# Patient Record
Sex: Female | Born: 1940 | Race: Black or African American | Hispanic: No | Marital: Single | Smoking: Never smoker
Health system: Southern US, Community
[De-identification: ages and names within clinical notes are randomized; demographics above are authoritative.]

## PROBLEM LIST (undated history)

## (undated) DIAGNOSIS — IMO0002 Reserved for concepts with insufficient information to code with codable children: Secondary | ICD-10-CM

## (undated) DIAGNOSIS — Z8719 Personal history of other diseases of the digestive system: Secondary | ICD-10-CM

## (undated) DIAGNOSIS — Z8711 Personal history of peptic ulcer disease: Secondary | ICD-10-CM

## (undated) DIAGNOSIS — I739 Peripheral vascular disease, unspecified: Secondary | ICD-10-CM

## (undated) DIAGNOSIS — I10 Essential (primary) hypertension: Secondary | ICD-10-CM

## (undated) HISTORY — DX: Peripheral vascular disease, unspecified: I73.9

## (undated) HISTORY — DX: Reserved for concepts with insufficient information to code with codable children: IMO0002

## (undated) HISTORY — PX: ABDOMINAL HYSTERECTOMY: SHX81

## (undated) HISTORY — PX: TOE AMPUTATION: SHX809

## (undated) HISTORY — DX: Essential (primary) hypertension: I10

## (undated) HISTORY — PX: TONSILLECTOMY: SUR1361

---

## 2013-09-22 ENCOUNTER — Encounter: Payer: Self-pay | Admitting: Family Medicine

## 2013-10-11 ENCOUNTER — Ambulatory Visit (INDEPENDENT_AMBULATORY_CARE_PROVIDER_SITE_OTHER): Payer: Commercial Managed Care - HMO | Admitting: Family Medicine

## 2013-10-11 ENCOUNTER — Encounter: Payer: Self-pay | Admitting: Family Medicine

## 2013-10-11 VITALS — BP 180/120 | HR 78 | Temp 98.1°F | Resp 16 | Ht <= 58 in | Wt 145.0 lb

## 2013-10-11 DIAGNOSIS — I10 Essential (primary) hypertension: Secondary | ICD-10-CM

## 2013-10-11 MED ORDER — LOSARTAN POTASSIUM 50 MG PO TABS: 50.0000 mg | ORAL_TABLET | Freq: Every day | ORAL | Status: DC

## 2013-10-11 NOTE — Progress Notes (Signed)
Subjective:    Patient ID: Ann Powell, female    DOB: 02/07/1941, 73 y.o.   MRN: [id number]  HPI Patient is a very pleasant 73 year old white female who presents today to establish care. She recently had a mammogram in September 2014. Her mammogram was normal in. She has never had a colonoscopy. He has a history of a hysterectomy and therefore does not require Pap smears. She is overdue for Pneumovax 23 along with Prevnar 13. Her blood pressure is extremely high today 180/120. After sitting for half an hour in the exam room talking with me her blood pressure recheck was 152/88. She denies any chest pain shortness of breath or dyspnea on exertion. She denies any significant past medical history. Her only past surgical history is hysterectomy. She is on numerous over-the-counter vitamins and supplements. She takes no prescription medication. Past Medical History  Diagnosis Date  . Ulcer   . Hypertension    Past Surgical History  Procedure Laterality Date  . Abdominal hysterectomy     Current Outpatient Prescriptions on File Prior to Visit  Medication Sig Dispense Refill  . cholecalciferol (VITAMIN D) 1000 UNITS tablet Take 1,000 Units by mouth daily.      Marland Kitchen. co-enzyme Q-10 30 MG capsule Take 30 mg by mouth 3 (three) times daily.      Marland Kitchen. FOLIC ACID PO Take 400 mg by mouth. 2 qam      . Krill Oil 1000 MG CAPS Take 1,000 mg by mouth daily.       . magnesium 30 MG tablet Take 500 mg by mouth daily.        No current facility-administered medications on file prior to visit.   No Known Allergies History   Social History  . Marital Status: Single    Spouse Name: N/A    Number of Children: N/A  . Years of Education: N/A   Occupational History  . Not on file.   Social History Main Topics  . Smoking status: Never Smoker   . Smokeless tobacco: Never Used  . Alcohol Use: No  . Drug Use: No  . Sexual Activity: No   Other Topics Concern  . Not on file   Social History Narrative    . No narrative on file   No family history on file.    Review of Systems  All other systems reviewed and are negative.      Objective:   Physical Exam  Vitals reviewed. Constitutional: She appears well-developed and well-nourished. No distress.  Neck: Neck supple. No JVD present. No thyromegaly present.  Cardiovascular: Normal rate, regular rhythm and normal heart sounds.   No murmur heard. Pulmonary/Chest: Effort normal and breath sounds normal. No respiratory distress. She has no wheezes. She has no rales.  Abdominal: Soft. Bowel sounds are normal. She exhibits no distension. There is no tenderness. There is no rebound and no guarding.  Musculoskeletal: She exhibits no edema.  Lymphadenopathy:    She has no cervical adenopathy.  Skin: She is not diaphoretic.          Assessment & Plan:  1. Essential hypertension Start the patient on losartan 50 mg by mouth daily recheck blood pressure in 2 weeks. Her recheck blood pressure was much better. I believe an element of her hypertension is white coat syndrome.  I have recommended a colonoscopy but she refused. I also recommended Prevnar and Pneumovax but she refused. I will discuss further at her next office visit. - COMPLETE METABOLIC  PANEL WITH GFR - CBC with Differential - losartan (COZAAR) 50 MG tablet; Take 1 tablet (50 mg total) by mouth daily.  Dispense: 30 tablet; Refill: 5

## 2013-10-12 LAB — COMPLETE METABOLIC PANEL WITH GFR
ALK PHOS: 46 U/L (ref 39–117)
ALT: 10 U/L (ref 0–35)
AST: 24 U/L (ref 0–37)
Albumin: 4.4 g/dL (ref 3.5–5.2)
BILIRUBIN TOTAL: 1.2 mg/dL (ref 0.2–1.2)
BUN: 12 mg/dL (ref 6–23)
CO2: 29 mEq/L (ref 19–32)
Calcium: 10.2 mg/dL (ref 8.4–10.5)
Chloride: 101 mEq/L (ref 96–112)
Creat: 0.84 mg/dL (ref 0.50–1.10)
GFR, EST NON AFRICAN AMERICAN: 70 mL/min
GFR, Est African American: 80 mL/min
GLUCOSE: 91 mg/dL (ref 70–99)
POTASSIUM: 5.5 meq/L — AB (ref 3.5–5.3)
Sodium: 140 mEq/L (ref 135–145)
TOTAL PROTEIN: 7 g/dL (ref 6.0–8.3)

## 2013-10-12 LAB — CBC WITH DIFFERENTIAL/PLATELET
Basophils Absolute: 0 10*3/uL (ref 0.0–0.1)
Basophils Relative: 1 % (ref 0–1)
EOS PCT: 1 % (ref 0–5)
Eosinophils Absolute: 0 10*3/uL (ref 0.0–0.7)
HEMATOCRIT: 34.7 % — AB (ref 36.0–46.0)
HEMOGLOBIN: 11.2 g/dL — AB (ref 12.0–15.0)
LYMPHS ABS: 1.9 10*3/uL (ref 0.7–4.0)
LYMPHS PCT: 39 % (ref 12–46)
MCH: 28.4 pg (ref 26.0–34.0)
MCHC: 32.3 g/dL (ref 30.0–36.0)
MCV: 87.8 fL (ref 78.0–100.0)
MONO ABS: 0.3 10*3/uL (ref 0.1–1.0)
MONOS PCT: 6 % (ref 3–12)
Neutro Abs: 2.6 10*3/uL (ref 1.7–7.7)
Neutrophils Relative %: 53 % (ref 43–77)
Platelets: 579 10*3/uL — ABNORMAL HIGH (ref 150–400)
RBC: 3.95 MIL/uL (ref 3.87–5.11)
RDW: 17.8 % — ABNORMAL HIGH (ref 11.5–15.5)
WBC: 4.9 10*3/uL (ref 4.0–10.5)

## 2013-10-25 ENCOUNTER — Encounter: Payer: Self-pay | Admitting: Family Medicine

## 2013-10-25 ENCOUNTER — Ambulatory Visit (INDEPENDENT_AMBULATORY_CARE_PROVIDER_SITE_OTHER): Payer: Commercial Managed Care - HMO | Admitting: Family Medicine

## 2013-10-25 VITALS — BP 136/74 | HR 76 | Temp 98.2°F | Resp 14 | Ht <= 58 in | Wt 141.0 lb

## 2013-10-25 DIAGNOSIS — I10 Essential (primary) hypertension: Secondary | ICD-10-CM

## 2013-10-25 DIAGNOSIS — Z23 Encounter for immunization: Secondary | ICD-10-CM

## 2013-10-25 DIAGNOSIS — D649 Anemia, unspecified: Secondary | ICD-10-CM

## 2013-10-25 MED ORDER — LOSARTAN POTASSIUM 50 MG PO TABS: 50.0000 mg | ORAL_TABLET | Freq: Every day | ORAL | Status: DC

## 2013-10-25 NOTE — Progress Notes (Signed)
Subjective:    Patient ID: Ann Powell, female    DOB: 1940/06/13, 73 y.o.   MRN: [id number]  HPI 10/10/13 Patient is a very pleasant 73 year old white female who presents today to establish care. She recently had a mammogram in September 2014. Her mammogram was normal in. She has never had a colonoscopy. He has a history of a hysterectomy and therefore does not require Pap smears. She is overdue for Pneumovax 23 along with Prevnar 13. Her blood pressure is extremely high today 180/120. After sitting for half an hour in the exam room talking with me her blood pressure recheck was 152/88. She denies any chest pain shortness of breath or dyspnea on exertion. She denies any significant past medical history. Her only past surgical history is hysterectomy. She is on numerous over-the-counter vitamins and supplements. She takes no prescription medication.  At that time, my plan was: 1. Essential hypertension Start the patient on losartan 50 mg by mouth daily recheck blood pressure in 2 weeks. Her recheck blood pressure was much better. I believe an element of her hypertension is white coat syndrome.  I have recommended a colonoscopy but she refused. I also recommended Prevnar and Pneumovax but she refused. I will discuss further at her next office visit. - COMPLETE METABOLIC PANEL WITH GFR - CBC with Differential - losartan (COZAAR) 50 MG tablet; Take 1 tablet (50 mg total) by mouth daily.  Dispense: 30 tablet; Refill: 5  10/25/13 Patient is here today for followup. Her blood pressures have been much better. Her blood pressures ranging 120-140/60-70 at home. She denies any side effects on the medication. Her labwork did show mildly elevated potassium along with a mildly suppressed hemoglobin. She is here today for followup. She is also due for Pneumovax. Past Medical History  Diagnosis Date  . Ulcer   . Hypertension    Past Surgical History  Procedure Laterality Date  . Abdominal hysterectomy       Current Outpatient Prescriptions on File Prior to Visit  Medication Sig Dispense Refill  . ALOE VERA EX Take 5,000 mg by mouth daily.      . Calcium-Magnesium-Vitamin D (CALCIUM 1200+D3 PO) Take 1,200 mg by mouth daily.      . cholecalciferol (VITAMIN D) 1000 UNITS tablet Take 1,000 Units by mouth daily.      Marland Kitchen. co-enzyme Q-10 30 MG capsule Take 30 mg by mouth 3 (three) times daily.      Marland Kitchen. FOLIC ACID PO Take 400 mg by mouth. 2 qam      . Krill Oil 1000 MG CAPS Take 1,000 mg by mouth daily.       . magnesium 30 MG tablet Take 500 mg by mouth daily.       . vitamin B-12 (CYANOCOBALAMIN) 500 MCG tablet Take 500 mcg by mouth daily.      . vitamin C (ASCORBIC ACID) 500 MG tablet Take 500 mg by mouth daily.       No current facility-administered medications on file prior to visit.   No Known Allergies History   Social History  . Marital Status: Single    Spouse Name: N/A    Number of Children: N/A  . Years of Education: N/A   Occupational History  . Not on file.   Social History Main Topics  . Smoking status: Never Smoker   . Smokeless tobacco: Never Used  . Alcohol Use: No  . Drug Use: No  . Sexual Activity: No   Other  Topics Concern  . Not on file   Social History Narrative  . No narrative on file   No family history on file.    Review of Systems  All other systems reviewed and are negative.      Objective:   Physical Exam  Vitals reviewed. Constitutional: She appears well-developed and well-nourished. No distress.  Neck: Neck supple. No JVD present. No thyromegaly present.  Cardiovascular: Normal rate, regular rhythm and normal heart sounds.   No murmur heard. Pulmonary/Chest: Effort normal and breath sounds normal. No respiratory distress. She has no wheezes. She has no rales.  Abdominal: Soft. Bowel sounds are normal. She exhibits no distension. There is no tenderness. There is no rebound and no guarding.  Musculoskeletal: She exhibits no edema.   Lymphadenopathy:    She has no cervical adenopathy.  Skin: She is not diaphoretic.          Assessment & Plan:   1. Essential hypertension Blood pressure is well controlled. I will continue losartan 50 mg by mouth daily. Recheck BMP to monitor her potassium. - losartan (COZAAR) 50 MG tablet; Take 1 tablet (50 mg total) by mouth daily.  Dispense: 30 tablet; Refill: 11 - Basic Metabolic Panel  2. Anemia, unspecified Check the patient's iron level, B12 level, and stool cards x3. Stool cards are positive for blood, I recommended GI consult. - Vitamin B12 - Iron  3. Need for prophylactic vaccination against Streptococcus pneumoniae (pneumococcus) - Pneumococcal polysaccharide vaccine 23-valent greater than or equal to 2yo subcutaneous/IM

## 2013-10-26 LAB — BASIC METABOLIC PANEL
BUN: 14 mg/dL (ref 6–23)
CO2: 31 mEq/L (ref 19–32)
Calcium: 10.4 mg/dL (ref 8.4–10.5)
Chloride: 101 mEq/L (ref 96–112)
Creat: 0.88 mg/dL (ref 0.50–1.10)
Glucose, Bld: 95 mg/dL (ref 70–99)
Potassium: 4.9 mEq/L (ref 3.5–5.3)
Sodium: 142 mEq/L (ref 135–145)

## 2013-10-26 LAB — IRON: Iron: 42 ug/dL (ref 42–145)

## 2013-10-26 LAB — VITAMIN B12: Vitamin B-12: >2000 pg/mL — ABNORMAL HIGH (ref 211–911)

## 2013-10-28 ENCOUNTER — Other Ambulatory Visit: Payer: Commercial Managed Care - HMO

## 2013-10-28 ENCOUNTER — Other Ambulatory Visit: Payer: Self-pay | Admitting: Family Medicine

## 2013-10-28 DIAGNOSIS — Z1211 Encounter for screening for malignant neoplasm of colon: Secondary | ICD-10-CM

## 2013-10-29 LAB — FECAL OCCULT BLOOD, IMMUNOCHEMICAL
FECAL OCCULT BLOOD: NEGATIVE
FECAL OCCULT BLOOD: NEGATIVE
Fecal Occult Blood: NEGATIVE

## 2013-11-11 ENCOUNTER — Encounter: Payer: Self-pay | Admitting: *Deleted

## 2013-12-28 ENCOUNTER — Ambulatory Visit (INDEPENDENT_AMBULATORY_CARE_PROVIDER_SITE_OTHER): Payer: Commercial Managed Care - HMO | Admitting: Family Medicine

## 2013-12-28 DIAGNOSIS — Z23 Encounter for immunization: Secondary | ICD-10-CM

## 2013-12-30 ENCOUNTER — Telehealth: Payer: Self-pay | Admitting: Family Medicine

## 2013-12-30 DIAGNOSIS — Z1211 Encounter for screening for malignant neoplasm of colon: Secondary | ICD-10-CM

## 2013-12-30 NOTE — Telephone Encounter (Signed)
Colonoscopy in referral workq

## 2013-12-30 NOTE — Telephone Encounter (Signed)
Patient is calling to see if we can refer her for a colonscopy  [phone]

## 2014-01-19 ENCOUNTER — Encounter: Payer: Self-pay | Admitting: Family Medicine

## 2014-10-17 ENCOUNTER — Encounter: Payer: Self-pay | Admitting: Family Medicine

## 2014-10-17 ENCOUNTER — Ambulatory Visit (INDEPENDENT_AMBULATORY_CARE_PROVIDER_SITE_OTHER): Payer: Commercial Managed Care - HMO | Admitting: Family Medicine

## 2014-10-17 VITALS — BP 150/96 | HR 76 | Temp 98.0°F | Resp 16 | Ht <= 58 in | Wt 138.5 lb

## 2014-10-17 DIAGNOSIS — Z0000 Encounter for general adult medical examination without abnormal findings: Secondary | ICD-10-CM

## 2014-10-17 DIAGNOSIS — I10 Essential (primary) hypertension: Secondary | ICD-10-CM

## 2014-10-17 DIAGNOSIS — Z23 Encounter for immunization: Secondary | ICD-10-CM

## 2014-10-17 MED ORDER — LOSARTAN POTASSIUM 50 MG PO TABS
50.0000 mg | ORAL_TABLET | Freq: Every day | ORAL | Status: DC
Start: 2014-10-17 — End: 2015-10-09

## 2014-10-17 NOTE — Addendum Note (Signed)
Addended by: Legrand RamsWILLIS, SANDY B on: 10/17/2014 03:41 PM   Modules accepted: Orders

## 2014-10-17 NOTE — Progress Notes (Signed)
Subjective:    Patient ID: Ann Powell, female    DOB: 1940/07/03, 74 y.o.   MRN: [id number]  HPI Patient is here today for complete physical exam. She is a 74 year old African-American female. She has no specific complaints. She again refuses a colonoscopy. She is due for a mammogram in September. She is overdue for a bone density test. She had Pneumovax 23 last year. She is due today for Prevnar 13. She is also due for the shingles vaccine. Due to her age she does not require a Pap smear. Patient is not fasting today and therefore cannot obtain blood work this afternoon. Her blood pressure today in office is elevated. However the patient has been checking her blood pressure frequently at home and her blood pressures typically range between 110 and 120 systolic over 60-70 diastolic. Past Medical History  Diagnosis Date  . Ulcer   . Hypertension    Past Surgical History  Procedure Laterality Date  . Abdominal hysterectomy     Current Outpatient Prescriptions on File Prior to Visit  Medication Sig Dispense Refill  . co-enzyme Q-10 30 MG capsule Take 30 mg by mouth 3 (three) times daily.    Marland Kitchen. FOLIC ACID PO Take 400 mg by mouth. 2 qam    . Krill Oil 1000 MG CAPS Take 1,000 mg by mouth daily.     . magnesium 30 MG tablet Take 500 mg by mouth daily.     . vitamin B-12 (CYANOCOBALAMIN) 500 MCG tablet Take 500 mcg by mouth daily.     No current facility-administered medications on file prior to visit.   No Known Allergies History   Social History  . Marital Status: Single    Spouse Name: N/A  . Number of Children: N/A  . Years of Education: N/A   Occupational History  . Not on file.   Social History Main Topics  . Smoking status: Never Smoker   . Smokeless tobacco: Never Used  . Alcohol Use: No  . Drug Use: No  . Sexual Activity: No   Other Topics Concern  . Not on file   Social History Narrative   No family history on file.    Review of Systems  All other  systems reviewed and are negative.      Objective:   Physical Exam  Constitutional: She is oriented to person, place, and time. She appears well-developed and well-nourished. No distress.  HENT:  Head: Normocephalic and atraumatic.  Right Ear: External ear normal.  Left Ear: External ear normal.  Nose: Nose normal.  Mouth/Throat: Oropharynx is clear and moist. No oropharyngeal exudate.  Eyes: Conjunctivae and EOM are normal. Pupils are equal, round, and reactive to light. Right eye exhibits no discharge. Left eye exhibits no discharge. No scleral icterus.  Neck: Normal range of motion. Neck supple. No JVD present. No tracheal deviation present. No thyromegaly present.  Cardiovascular: Normal rate, regular rhythm, normal heart sounds and intact distal pulses.  Exam reveals no gallop and no friction rub.   No murmur heard. Pulmonary/Chest: Effort normal and breath sounds normal. No stridor. No respiratory distress. She has no wheezes. She has no rales. She exhibits no tenderness.  Abdominal: Soft. Bowel sounds are normal. She exhibits no distension and no mass. There is no tenderness. There is no rebound and no guarding.  Musculoskeletal: Normal range of motion. She exhibits no edema or tenderness.  Lymphadenopathy:    She has no cervical adenopathy.  Neurological: She is alert and  oriented to person, place, and time. She has normal reflexes. She displays normal reflexes. No cranial nerve deficit. She exhibits normal muscle tone. Coordination normal.  Skin: Skin is warm. No rash noted. She is not diaphoretic. No erythema. No pallor.  Psychiatric: She has a normal mood and affect. Her behavior is normal. Judgment and thought content normal.  Vitals reviewed.         Assessment & Plan:  Routine general medical examination at a health care facility - Plan: CBC with Differential/Platelet, COMPLETE METABOLIC PANEL WITH GFR, Lipid panel, TSH, Fecal occult blood, imunochemical, Fecal occult  blood, imunochemical, Fecal occult blood, imunochemical  Benign essential HTN - Plan: Lipid panel  Essential hypertension - Plan: losartan (COZAAR) 50 MG tablet  Patient's physical exam today is normal except for significant kyphosis of the cervical spine. Given her age I will schedule the patient for mammogram. I will also schedule her for a bone density. She does not require a Pap smear. I recommended a colonoscopy but the patient declined. She will consent to fecal occult blood cards 3. I would like the patient to return fasting for a CBC, CMP, fasting lipid panel, and a TSH. Patient's blood pressure at home is adequately controlled and therefore I will not change her medication any further.

## 2014-10-19 ENCOUNTER — Ambulatory Visit (INDEPENDENT_AMBULATORY_CARE_PROVIDER_SITE_OTHER): Payer: Commercial Managed Care - HMO | Admitting: *Deleted

## 2014-10-19 ENCOUNTER — Other Ambulatory Visit: Payer: Commercial Managed Care - HMO

## 2014-10-19 DIAGNOSIS — I10 Essential (primary) hypertension: Secondary | ICD-10-CM | POA: Diagnosis not present

## 2014-10-19 DIAGNOSIS — Z23 Encounter for immunization: Secondary | ICD-10-CM | POA: Diagnosis not present

## 2014-10-19 DIAGNOSIS — Z0000 Encounter for general adult medical examination without abnormal findings: Secondary | ICD-10-CM | POA: Diagnosis not present

## 2014-10-19 LAB — LIPID PANEL
Cholesterol: 183 mg/dL (ref 0–200)
HDL: 60 mg/dL (ref >=46)
LDL CALC: 107 mg/dL — AB (ref 0–99)
Total CHOL/HDL Ratio: 3.1 Ratio
Triglycerides: 80 mg/dL (ref <150)
VLDL: 16 mg/dL (ref 0–40)

## 2014-10-19 LAB — COMPLETE METABOLIC PANEL WITH GFR
ALK PHOS: 47 U/L (ref 39–117)
ALT: 10 U/L (ref 0–35)
AST: 21 U/L (ref 0–37)
Albumin: 4 g/dL (ref 3.5–5.2)
BILIRUBIN TOTAL: 1.1 mg/dL (ref 0.2–1.2)
BUN: 10 mg/dL (ref 6–23)
CHLORIDE: 106 meq/L (ref 96–112)
CO2: 27 mEq/L (ref 19–32)
Calcium: 9.4 mg/dL (ref 8.4–10.5)
Creat: 0.83 mg/dL (ref 0.50–1.10)
GFR, Est African American: 81 mL/min
GFR, Est Non African American: 70 mL/min
GLUCOSE: 69 mg/dL — AB (ref 70–99)
POTASSIUM: 4.3 meq/L (ref 3.5–5.3)
Sodium: 144 mEq/L (ref 135–145)
Total Protein: 6.8 g/dL (ref 6.0–8.3)

## 2014-10-19 LAB — TSH: TSH: 2.117 u[IU]/mL (ref 0.350–4.500)

## 2014-10-19 NOTE — Progress Notes (Signed)
Patient ID: Ann MatesDorothy Powell Sattler, female   DOB: [birth year], 74 y.o.   MRN: [id number]  Patient seen in office for Varicella (Zoster) Vaccination.   Tolerated IM administration well.   Immunization history updated.

## 2014-10-20 ENCOUNTER — Encounter: Payer: Self-pay | Admitting: Family Medicine

## 2014-10-20 LAB — CBC WITH DIFFERENTIAL/PLATELET
BASOS ABS: 0 10*3/uL (ref 0.0–0.1)
Basophils Relative: 1 % (ref 0–1)
EOS PCT: 1 % (ref 0–5)
Eosinophils Absolute: 0 10*3/uL (ref 0.0–0.7)
HEMATOCRIT: 34.5 % — AB (ref 36.0–46.0)
Hemoglobin: 11 g/dL — ABNORMAL LOW (ref 12.0–15.0)
Lymphocytes Relative: 39 % (ref 12–46)
Lymphs Abs: 1.6 10*3/uL (ref 0.7–4.0)
MCH: 29.1 pg (ref 26.0–34.0)
MCHC: 31.9 g/dL (ref 30.0–36.0)
MCV: 91.3 fL (ref 78.0–100.0)
MONOS PCT: 5 % (ref 3–12)
MPV: 8.7 fL (ref 8.6–12.4)
Monocytes Absolute: 0.2 10*3/uL (ref 0.1–1.0)
NEUTROS ABS: 2.3 10*3/uL (ref 1.7–7.7)
Neutrophils Relative %: 54 % (ref 43–77)
Platelets: 529 10*3/uL — ABNORMAL HIGH (ref 150–400)
RBC: 3.78 MIL/uL — AB (ref 3.87–5.11)
RDW: 17.7 % — ABNORMAL HIGH (ref 11.5–15.5)
WBC: 4.2 10*3/uL (ref 4.0–10.5)

## 2014-10-20 LAB — FECAL OCCULT BLOOD, IMMUNOCHEMICAL
Fecal Occult Blood: NEGATIVE
Fecal Occult Blood: NEGATIVE
Fecal Occult Blood: NEGATIVE

## 2014-12-01 DIAGNOSIS — H524 Presbyopia: Secondary | ICD-10-CM | POA: Diagnosis not present

## 2014-12-01 DIAGNOSIS — H5210 Myopia, unspecified eye: Secondary | ICD-10-CM | POA: Diagnosis not present

## 2014-12-23 DIAGNOSIS — Z1231 Encounter for screening mammogram for malignant neoplasm of breast: Secondary | ICD-10-CM | POA: Diagnosis not present

## 2014-12-23 LAB — HM MAMMOGRAPHY: HM MAMMO: NEGATIVE

## 2014-12-27 ENCOUNTER — Encounter: Payer: Self-pay | Admitting: Family Medicine

## 2015-02-13 ENCOUNTER — Ambulatory Visit (INDEPENDENT_AMBULATORY_CARE_PROVIDER_SITE_OTHER): Payer: Commercial Managed Care - HMO | Admitting: Family Medicine

## 2015-02-13 DIAGNOSIS — Z23 Encounter for immunization: Secondary | ICD-10-CM

## 2015-10-09 ENCOUNTER — Encounter: Payer: Self-pay | Admitting: Family Medicine

## 2015-10-09 ENCOUNTER — Other Ambulatory Visit: Payer: Self-pay | Admitting: Family Medicine

## 2015-10-09 ENCOUNTER — Ambulatory Visit (INDEPENDENT_AMBULATORY_CARE_PROVIDER_SITE_OTHER): Payer: Commercial Managed Care - HMO | Admitting: Family Medicine

## 2015-10-09 VITALS — BP 148/88 | HR 94 | Temp 98.3°F | Resp 16 | Ht <= 58 in | Wt 141.0 lb

## 2015-10-09 DIAGNOSIS — I10 Essential (primary) hypertension: Secondary | ICD-10-CM | POA: Diagnosis not present

## 2015-10-09 DIAGNOSIS — E538 Deficiency of other specified B group vitamins: Secondary | ICD-10-CM | POA: Diagnosis not present

## 2015-10-09 DIAGNOSIS — Z1211 Encounter for screening for malignant neoplasm of colon: Secondary | ICD-10-CM

## 2015-10-09 DIAGNOSIS — D649 Anemia, unspecified: Secondary | ICD-10-CM | POA: Diagnosis not present

## 2015-10-09 MED ORDER — LOSARTAN POTASSIUM 50 MG PO TABS: 50.0000 mg | ORAL_TABLET | Freq: Every day | ORAL | Status: DC

## 2015-10-09 NOTE — Progress Notes (Signed)
   Subjective:    Patient ID: Ann Powell, female    DOB: 01-23-1941, 75 y.o.   MRN: [id number]  HPI She is here today for follow-up of her blood pressure. Blood pressure is elevated at 148/88. However the patient checks her blood pressure at home and typically finds between 110 and 120/60-70. This is outstanding. She denies any side effects from her medication. She schedules her mammograms every September. She declines a bone density. She declines a colonoscopy. She is over the age of 75 and therefore does not require a Pap smear Past Medical History  Diagnosis Date  . Ulcer   . Hypertension    Past Surgical History  Procedure Laterality Date  . Abdominal hysterectomy     Current Outpatient Prescriptions on File Prior to Visit  Medication Sig Dispense Refill  . Alfalfa 650 MG TABS Take by mouth.    . co-enzyme Q-10 30 MG capsule Take 30 mg by mouth 3 (three) times daily.    . ferrous sulfate 325 (65 FE) MG tablet Take 325 mg by mouth daily with breakfast.    . FOLIC ACID PO Take 400 mg by mouth. 2 qam    . Krill Oil 1000 MG CAPS Take 1,000 mg by mouth daily.     . magnesium 30 MG tablet Take 500 mg by mouth daily.     . vitamin B-12 (CYANOCOBALAMIN) 500 MCG tablet Take 500 mcg by mouth daily.     No current facility-administered medications on file prior to visit.   No Known Allergies Social History   Social History  . Marital Status: Single    Spouse Name: N/A  . Number of Children: N/A  . Years of Education: N/A   Occupational History  . Not on file.   Social History Main Topics  . Smoking status: Never Smoker   . Smokeless tobacco: Never Used  . Alcohol Use: No  . Drug Use: No  . Sexual Activity: No   Other Topics Concern  . Not on file   Social History Narrative      Review of Systems  All other systems reviewed and are negative.      Objective:   Physical Exam  Constitutional: She appears well-developed and well-nourished.  Neck: Neck supple. No  JVD present.  Cardiovascular: Normal rate, regular rhythm and normal heart sounds.   No murmur heard. Pulmonary/Chest: Effort normal and breath sounds normal. No respiratory distress. She has no wheezes. She has no rales.  Abdominal: Soft. Bowel sounds are normal. She exhibits no distension. There is no tenderness. There is no rebound and no guarding.  Musculoskeletal: She exhibits no edema.  Lymphadenopathy:    She has no cervical adenopathy.  Vitals reviewed.         Assessment & Plan:  Benign essential HTN - Plan: CBC with Differential/Platelet, COMPLETE METABOLIC PANEL WITH GFR  Essential hypertension - Plan: losartan (COZAAR) 50 MG tablet  Colon cancer screening - Plan: Fecal occult blood, imunochemical, Fecal occult blood, imunochemical, Fecal occult blood, imunochemical  Home blood pressures are excellent. Continue losartan 50 mg by mouth daily. Patient declines a bone density test. I will send the patient home with stool cards 3 to evaluate for colon cancer which she is willing to do. Immunizations are up-to-date.

## 2015-10-10 ENCOUNTER — Other Ambulatory Visit: Payer: Self-pay | Admitting: Family Medicine

## 2015-10-10 LAB — CBC WITH DIFFERENTIAL/PLATELET
BASOS ABS: 47 {cells}/uL (ref 0–200)
Basophils Relative: 1 %
EOS ABS: 47 {cells}/uL (ref 15–500)
Eosinophils Relative: 1 %
HEMATOCRIT: 34.4 % — AB (ref 35.0–45.0)
Hemoglobin: 10.7 g/dL — ABNORMAL LOW (ref 12.0–15.0)
Lymphocytes Relative: 38 %
Lymphs Abs: 1786 cells/uL (ref 850–3900)
MCH: 28.5 pg (ref 27.0–33.0)
MCHC: 31.1 g/dL — ABNORMAL LOW (ref 32.0–36.0)
MCV: 91.5 fL (ref 80.0–100.0)
MONO ABS: 329 {cells}/uL (ref 200–950)
MONOS PCT: 7 %
MPV: 9.2 fL (ref 7.5–12.5)
NEUTROS ABS: 2491 {cells}/uL (ref 1500–7800)
Neutrophils Relative %: 53 %
PLATELETS: 436 10*3/uL — AB (ref 140–400)
RBC: 3.76 MIL/uL — ABNORMAL LOW (ref 3.80–5.10)
RDW: 18 % — ABNORMAL HIGH (ref 11.0–15.0)
WBC: 4.7 10*3/uL (ref 3.8–10.8)

## 2015-10-10 LAB — COMPLETE METABOLIC PANEL WITH GFR
ALT: 10 U/L (ref 6–29)
AST: 20 U/L (ref 10–35)
Albumin: 4.1 g/dL (ref 3.6–5.1)
Alkaline Phosphatase: 41 U/L (ref 33–130)
BILIRUBIN TOTAL: 1 mg/dL (ref 0.2–1.2)
BUN: 13 mg/dL (ref 7–25)
CO2: 28 mmol/L (ref 20–31)
Calcium: 9.4 mg/dL (ref 8.6–10.4)
Chloride: 104 mmol/L (ref 98–110)
Creat: 0.8 mg/dL (ref 0.60–0.93)
GFR, EST NON AFRICAN AMERICAN: 73 mL/min (ref >=60)
GFR, Est African American: 84 mL/min (ref >=60)
Glucose, Bld: 84 mg/dL (ref 70–99)
Potassium: 4.5 mmol/L (ref 3.5–5.3)
Sodium: 140 mmol/L (ref 135–146)
TOTAL PROTEIN: 6.4 g/dL (ref 6.1–8.1)

## 2015-10-10 NOTE — Telephone Encounter (Signed)
Refill appropriate and filled per protocol. 

## 2015-10-11 LAB — VITAMIN B12

## 2015-10-11 LAB — FOLATE

## 2015-10-13 ENCOUNTER — Other Ambulatory Visit: Payer: Commercial Managed Care - HMO

## 2015-10-13 DIAGNOSIS — Z1211 Encounter for screening for malignant neoplasm of colon: Secondary | ICD-10-CM | POA: Diagnosis not present

## 2015-10-14 LAB — FECAL OCCULT BLOOD, IMMUNOCHEMICAL
FECAL OCCULT BLOOD: NEGATIVE
Fecal Occult Blood: NEGATIVE
Fecal Occult Blood: NEGATIVE

## 2015-10-17 ENCOUNTER — Encounter: Payer: Self-pay | Admitting: Family Medicine

## 2015-10-17 DIAGNOSIS — I10 Essential (primary) hypertension: Secondary | ICD-10-CM | POA: Insufficient documentation

## 2015-12-27 DIAGNOSIS — H524 Presbyopia: Secondary | ICD-10-CM | POA: Diagnosis not present

## 2015-12-29 DIAGNOSIS — Z1231 Encounter for screening mammogram for malignant neoplasm of breast: Secondary | ICD-10-CM | POA: Diagnosis not present

## 2015-12-29 LAB — HM MAMMOGRAPHY

## 2016-01-18 ENCOUNTER — Encounter: Payer: Self-pay | Admitting: Family Medicine

## 2016-01-26 ENCOUNTER — Ambulatory Visit (INDEPENDENT_AMBULATORY_CARE_PROVIDER_SITE_OTHER): Payer: Commercial Managed Care - HMO | Admitting: Family Medicine

## 2016-01-26 DIAGNOSIS — Z23 Encounter for immunization: Secondary | ICD-10-CM

## 2016-05-31 ENCOUNTER — Encounter: Payer: Self-pay | Admitting: Family Medicine

## 2016-05-31 ENCOUNTER — Ambulatory Visit (INDEPENDENT_AMBULATORY_CARE_PROVIDER_SITE_OTHER): Payer: Commercial Managed Care - HMO | Admitting: Family Medicine

## 2016-05-31 VITALS — BP 164/94 | HR 96 | Temp 97.5°F | Resp 18 | Wt 136.0 lb

## 2016-05-31 DIAGNOSIS — L03032 Cellulitis of left toe: Secondary | ICD-10-CM

## 2016-05-31 MED ORDER — TRAMADOL HCL 50 MG PO TABS: 50.0000 mg | ORAL_TABLET | Freq: Three times a day (TID) | ORAL | 0 refills | Status: DC | PRN

## 2016-05-31 MED ORDER — SULFAMETHOXAZOLE-TRIMETHOPRIM 800-160 MG PO TABS: 1.0000 | ORAL_TABLET | Freq: Two times a day (BID) | ORAL | 0 refills | Status: DC

## 2016-05-31 NOTE — Progress Notes (Signed)
Subjective:    Patient ID: Ann Powell, female    DOB: 28-Dec-1940, 76 y.o.   MRN: [id number]  HPI  Reports several weeks of pain in her left great toe. The pain was from the MTP joint to the tip of her toenail. There is also some swelling over the second and third MTP joint. The skin overlying the left great toe is erythematous warm and tender to the touch. She has pain with range of motion of the first MTP joint. She has no history of gout. The toenail is dystrophic. There appears to be slight yellow opaque purulent material underneath the tip of the toenail. Past Medical History:  Diagnosis Date  . Hypertension   . Ulcer [facility]CC)    Past Surgical History:  Procedure Laterality Date  . ABDOMINAL HYSTERECTOMY     Current Outpatient Prescriptions on File Prior to Visit  Medication Sig Dispense Refill  . Alfalfa 650 MG TABS Take by mouth.    . co-enzyme Q-10 30 MG capsule Take 30 mg by mouth 3 (three) times daily.    . ferrous sulfate 325 (65 FE) MG tablet Take 325 mg by mouth daily with breakfast.    . FOLIC ACID PO Take 400 mg by mouth. 2 qam    . Krill Oil 1000 MG CAPS Take 1,000 mg by mouth daily.     Marland Kitchen. losartan (COZAAR) 50 MG tablet TAKE 1 TABLET EVERY DAY 90 tablet 3  . magnesium 30 MG tablet Take 500 mg by mouth daily.     . vitamin B-12 (CYANOCOBALAMIN) 500 MCG tablet Take 500 mcg by mouth daily.     No current facility-administered medications on file prior to visit.    No Known Allergies Social History   Social History  . Marital status: Single    Spouse name: N/A  . Number of children: N/A  . Years of education: N/A   Occupational History  . Not on file.   Social History Main Topics  . Smoking status: Never Smoker  . Smokeless tobacco: Never Used  . Alcohol use No  . Drug use: No  . Sexual activity: No   Other Topics Concern  . Not on file   Social History Narrative  . No narrative on file      Review of Systems  All other systems reviewed and  are negative.      Objective:   Physical Exam  Constitutional: She appears well-developed and well-nourished.  Neck: Neck supple. No JVD present.  Cardiovascular: Normal rate, regular rhythm and normal heart sounds.   No murmur heard. Pulmonary/Chest: Effort normal and breath sounds normal. No respiratory distress. She has no wheezes. She has no rales.  Abdominal: Soft. Bowel sounds are normal. She exhibits no distension. There is no tenderness. There is no rebound and no guarding.  Musculoskeletal: She exhibits no edema.  Lymphadenopathy:    She has no cervical adenopathy.  Vitals reviewed.   Left great toe is erythematous warm and painful from the first MTP joint to the tip of the toenail. Toenail has onychomycosis in his loose and is mobile on the underlying nail bed. There appears to be purulent fluid underneath the toenail      Assessment & Plan:  Patient has cellulitis in the left great toe. I cannot rule out osteomyelitis. Begin Bactrim double strength tablets 1 by mouth twice a day and recheck next week. If toe is worsening, we'll proceed with imaging of the toe such as an  MRI.

## 2016-06-03 ENCOUNTER — Encounter: Payer: Self-pay | Admitting: Family Medicine

## 2016-06-03 ENCOUNTER — Ambulatory Visit (INDEPENDENT_AMBULATORY_CARE_PROVIDER_SITE_OTHER): Payer: Medicare HMO | Admitting: Family Medicine

## 2016-06-03 VITALS — BP 160/88 | HR 78 | Temp 98.4°F | Resp 16 | Ht <= 58 in | Wt 136.0 lb

## 2016-06-03 DIAGNOSIS — L03032 Cellulitis of left toe: Secondary | ICD-10-CM | POA: Diagnosis not present

## 2016-06-03 NOTE — Progress Notes (Signed)
Subjective:    Patient ID: Ann Powell, female    DOB: [birth year], 76 y.o.   MRN: [id number]  HPI  05/31/16 Reports several weeks of pain in her left great toe. The pain was from the MTP joint to the tip of her toenail. There is also some swelling over the second and third MTP joint. The skin overlying the left great toe is erythematous warm and tender to the touch. She has pain with range of motion of the first MTP joint. She has no history of gout. The toenail is dystrophic. There appears to be slight yellow opaque purulent material underneath the tip of the toenail.  At that time, my plan was: Patient has cellulitis in the left great toe. I cannot rule out osteomyelitis. Begin Bactrim double strength tablets 1 by mouth twice a day and recheck next week. If toe is worsening, we'll proceed with imaging of the toe such as an MRI.  06/03/16 The patient's toe looks much better today. The erythema has been replaced by cool skin with his normal coloration. There is not as much pain in the area although she does have some arthritic pain in the first MTP joint. There is no longer any purulent material around the base of the toenail on the left great toe. Past Medical History:  Diagnosis Date  . Hypertension   . Ulcer [facility](HCC)    Past Surgical History:  Procedure Laterality Date  . ABDOMINAL HYSTERECTOMY     Current Outpatient Prescriptions on File Prior to Visit  Medication Sig Dispense Refill  . Alfalfa 650 MG TABS Take by mouth.    . co-enzyme Q-10 30 MG capsule Take 30 mg by mouth 3 (three) times daily.    . ferrous sulfate 325 (65 FE) MG tablet Take 325 mg by mouth daily with breakfast.    . FOLIC ACID PO Take 400 mg by mouth. 2 qam    . Krill Oil 1000 MG CAPS Take 1,000 mg by mouth daily.     Marland Kitchen. losartan (COZAAR) 50 MG tablet TAKE 1 TABLET EVERY DAY 90 tablet 3  . magnesium 30 MG tablet Take 500 mg by mouth daily.     Marland Kitchen. sulfamethoxazole-trimethoprim (BACTRIM DS,SEPTRA DS) 800-160 MG tablet  Take 1 tablet by mouth 2 (two) times daily. 10 tablet 0  . traMADol (ULTRAM) 50 MG tablet Take 1 tablet (50 mg total) by mouth every 8 (eight) hours as needed. 30 tablet 0  . vitamin B-12 (CYANOCOBALAMIN) 500 MCG tablet Take 500 mcg by mouth daily.     No current facility-administered medications on file prior to visit.    No Known Allergies Social History   Social History  . Marital status: Single    Spouse name: N/A  . Number of children: N/A  . Years of education: N/A   Occupational History  . Not on file.   Social History Main Topics  . Smoking status: Never Smoker  . Smokeless tobacco: Never Used  . Alcohol use No  . Drug use: No  . Sexual activity: No   Other Topics Concern  . Not on file   Social History Narrative  . No narrative on file      Review of Systems  All other systems reviewed and are negative.      Objective:   Physical Exam  Constitutional: She appears well-developed and well-nourished.  Neck: Neck supple. No JVD present.  Cardiovascular: Normal rate, regular rhythm and normal heart sounds.   No  murmur heard. Pulmonary/Chest: Effort normal and breath sounds normal. No respiratory distress. She has no wheezes. She has no rales.  Abdominal: Soft. Bowel sounds are normal. She exhibits no distension. There is no tenderness. There is no rebound and no guarding.  Musculoskeletal: She exhibits no edema.  Lymphadenopathy:    She has no cervical adenopathy.  Vitals reviewed.   Left great toe is no longer erythematous warm or  painful from the first MTP joint to the tip of the toenail. Toenail has onychomycosis in his loose and is mobile on the underlying nail bed. The purulent material has resolved.        Assessment & Plan:  Cellulitis of toe of left foot  Cellulitis appearing to resolve. Patient declines to have the toenail removed. Complete the course of antibiotics and recheck should the pain return or should the erythema return.

## 2016-08-08 ENCOUNTER — Ambulatory Visit (INDEPENDENT_AMBULATORY_CARE_PROVIDER_SITE_OTHER): Payer: Medicare HMO | Admitting: Podiatry

## 2016-08-08 DIAGNOSIS — M79605 Pain in left leg: Secondary | ICD-10-CM | POA: Diagnosis not present

## 2016-08-08 DIAGNOSIS — L03039 Cellulitis of unspecified toe: Secondary | ICD-10-CM

## 2016-08-08 DIAGNOSIS — M79604 Pain in right leg: Secondary | ICD-10-CM

## 2016-08-08 DIAGNOSIS — L02619 Cutaneous abscess of unspecified foot: Secondary | ICD-10-CM

## 2016-08-08 DIAGNOSIS — M79671 Pain in right foot: Secondary | ICD-10-CM

## 2016-08-08 DIAGNOSIS — I999 Unspecified disorder of circulatory system: Secondary | ICD-10-CM | POA: Diagnosis not present

## 2016-08-08 DIAGNOSIS — B351 Tinea unguium: Secondary | ICD-10-CM | POA: Diagnosis not present

## 2016-08-08 MED ORDER — HYDROCODONE-ACETAMINOPHEN 10-325 MG PO TABS: 1.0000 | ORAL_TABLET | Freq: Three times a day (TID) | ORAL | 0 refills | Status: DC | PRN

## 2016-08-08 MED ORDER — HYDROCODONE-ACETAMINOPHEN 10-325 MG PO TABS
1.0000 | ORAL_TABLET | Freq: Three times a day (TID) | ORAL | 0 refills | Status: DC | PRN
Start: 2016-08-08 — End: 2016-08-08

## 2016-08-08 NOTE — Addendum Note (Signed)
Addended by: Alphia Kava'CONNELL, VALERY D on: 08/08/2016 06:46 PM   Modules accepted: Orders

## 2016-08-08 NOTE — Progress Notes (Signed)
Subjective:    Patient ID: Ann Powell, female   DOB: 76 y.o.   MRN: [id number]   HPI patient presents with quite a bit of irritated nails with irritation at the end of the third toe left. States that she gets pain in her legs and that the nails have really become irritated over the last couple months    Review of Systems  All other systems reviewed and are negative.       Objective:  Physical Exam  Constitutional: She is oriented to person, place, and time.  Musculoskeletal: Normal range of motion.  Neurological: She is alert and oriented to person, place, and time.  Skin: Skin is warm and dry.  Nursing note and vitals reviewed.  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 I then debrided nailbeds advised on soaks and she stated this seemed to feel some better and we will just watch this of any issues were to occur she is to let her Jamelle HaringSnow. She is getting quite a bit of pain and night and I placed on hydrocodone to try to handle the night pain she is experiencing and again I did discuss with her that she'll be treated pending results of the vascular studies with the hope that revascularization could be beneficial for her and help her with the pain she is experiencing

## 2016-08-20 ENCOUNTER — Ambulatory Visit (HOSPITAL_COMMUNITY)
Admission: RE | Admit: 2016-08-20 | Discharge: 2016-08-20 | Disposition: A | Discharge status: Home / Self Care | Payer: Medicare HMO | Source: Ambulatory Visit | Attending: Cardiology | Admitting: Cardiology

## 2016-08-20 DIAGNOSIS — L03039 Cellulitis of unspecified toe: Secondary | ICD-10-CM | POA: Insufficient documentation

## 2016-08-20 DIAGNOSIS — M79605 Pain in left leg: Secondary | ICD-10-CM | POA: Insufficient documentation

## 2016-08-20 DIAGNOSIS — I999 Unspecified disorder of circulatory system: Secondary | ICD-10-CM | POA: Diagnosis not present

## 2016-08-20 DIAGNOSIS — E785 Hyperlipidemia, unspecified: Secondary | ICD-10-CM | POA: Diagnosis not present

## 2016-08-20 DIAGNOSIS — I739 Peripheral vascular disease, unspecified: Secondary | ICD-10-CM | POA: Diagnosis not present

## 2016-08-20 DIAGNOSIS — L02619 Cutaneous abscess of unspecified foot: Secondary | ICD-10-CM | POA: Insufficient documentation

## 2016-08-20 DIAGNOSIS — I700 Atherosclerosis of aorta: Secondary | ICD-10-CM | POA: Insufficient documentation

## 2016-08-20 DIAGNOSIS — I10 Essential (primary) hypertension: Secondary | ICD-10-CM | POA: Insufficient documentation

## 2016-08-20 DIAGNOSIS — M79604 Pain in right leg: Secondary | ICD-10-CM

## 2016-08-22 ENCOUNTER — Ambulatory Visit (INDEPENDENT_AMBULATORY_CARE_PROVIDER_SITE_OTHER): Payer: Medicare HMO | Admitting: Cardiovascular Disease

## 2016-08-22 ENCOUNTER — Encounter: Payer: Self-pay | Admitting: Cardiovascular Disease

## 2016-08-22 VITALS — BP 168/82 | Ht <= 58 in | Wt 137.4 lb

## 2016-08-22 DIAGNOSIS — I70229 Atherosclerosis of native arteries of extremities with rest pain, unspecified extremity: Secondary | ICD-10-CM

## 2016-08-22 DIAGNOSIS — I10 Essential (primary) hypertension: Secondary | ICD-10-CM | POA: Diagnosis not present

## 2016-08-22 DIAGNOSIS — I998 Other disorder of circulatory system: Secondary | ICD-10-CM | POA: Diagnosis not present

## 2016-08-22 NOTE — Patient Instructions (Addendum)
   [facility] [address] [place] [address] Dept: [phone] Loc: [phone]  Marcelline MatesDorothy M Limb  08/22/2016  You are scheduled for a Peripheral Angiogram on Monday, June 4 with Dr. Nanetta BattyJonathan Berry.  1. Please arrive [facility] the [facility] at [facility] [place]: [address] [place], [place] [address] at 5:30 AM (two hours before your procedure to ensure your preparation). Free valet parking service is available.   Special note: Every effort is made to have your procedure done on time. Please understand that emergencies sometimes delay scheduled procedures.  2. Diet: Do not eat or drink anything after midnight prior to your procedure except sips of water to take medications.  3. Labs: You will need to have blood drawn on Monday, May 29 at [facility] [address], [place]  Open: 8am - 5pm (Lunch 12:30 - 1:30)   Phone: [phone]. You do not need to be fasting.  4. Medication instructions in preparation for your procedure:  On the morning of your procedure, take any morning medicines NOT listed above.  You may use sips of water.  5. Plan for one night stay--bring personal belongings. 6. Bring a current list of your medications and current insurance cards. 7. You MUST have a responsible person to drive you home. 8. Someone MUST be with you the first 24 hours after you arrive home or your discharge will be delayed. 9. Please wear clothes that are easy to get on and off and wear slip-on shoes.  Thank you for allowing us to care for you!   -- [facility]

## 2016-08-22 NOTE — Assessment & Plan Note (Addendum)
Ann Powell was referred to me by Dr. Dellia Nimsiegel for left third toe critical limb ischemia. She has exposed bone. Benign for at least a month. Her Dopplers performed 2 days ago revealed a right ABI 0.74 and a left upper 56. She has occluded left SFA, dorsalis pedis and posterior tibial artery. Renal function is normal. She'll need angiography and potential intervention for limb salvage.

## 2016-08-22 NOTE — Progress Notes (Signed)
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 She was referred by Dr. Charlsie Merlesegal , her podiatrist, for peripheral vascular evaluation because of a nonhealing left third toe. Her only risk factor for cardiovascular disease include treated hypertension. She's never had a heart attack or stroke. She is not diabetic nor is she smoke. She's had a nonhealing wound on her left third toe fillet last month and obtain Dopplers in the office 2 days ago that showed a left ABI 0.56 occluded left SFA and one-vessel runoff via the peroneal artery.   Current Outpatient Prescriptions  Medication Sig Dispense Refill  . Alfalfa 650 MG TABS Take by mouth.    . co-enzyme Q-10 30 MG capsule Take 30 mg by mouth 3 (three) times daily.    . ferrous sulfate 325 (65 FE) MG tablet Take 325 mg by mouth daily with breakfast.    . FOLIC ACID PO Take 400 mg by mouth. 2 qam    . HYDROcodone-acetaminophen (NORCO) 10-325 MG tablet Take 1 tablet by mouth every 8 (eight) hours as needed. 30 tablet 0  . Krill Oil 1000 MG CAPS Take 1,000 mg by mouth daily.     Marland Kitchen. losartan (COZAAR) 50 MG tablet TAKE 1 TABLET EVERY DAY 90 tablet 3  . magnesium 30 MG tablet Take 500 mg by mouth daily.     Marland Kitchen. sulfamethoxazole-trimethoprim (BACTRIM DS,SEPTRA DS) 800-160 MG tablet Take 1 tablet by mouth 2 (two) times daily. 10 tablet 0  . traMADol (ULTRAM) 50 MG tablet Take 1 tablet (50 mg total) by mouth every 8 (eight) hours as needed. 30 tablet 0  . vitamin B-12 (CYANOCOBALAMIN) 500 MCG tablet Take 500 mcg by mouth daily.     No current facility-administered medications for this visit.     No Known Allergies  Social History   Social History  . Marital status: Single   Spouse name: N/A  . Number of children: N/A  . Years of education: N/A   Occupational History  . Not on file.   Social History Main Topics  . Smoking status: Never Smoker  . Smokeless tobacco: Never Used  . Alcohol use No  . Drug use: No  . Sexual activity: No   Other Topics Concern  . Not on file   Social History Narrative  . No narrative on file     Review of Systems: General: negative for chills, fever, night sweats or weight changes.  Cardiovascular: negative for chest pain, dyspnea on exertion, edema, orthopnea, palpitations, paroxysmal nocturnal dyspnea or shortness of breath Dermatological: negative for rash Respiratory: negative for cough or wheezing Urologic: negative for hematuria Abdominal: negative for nausea, vomiting, diarrhea, bright red blood per rectum, melena, or hematemesis Neurologic: negative for visual changes, syncope, or dizziness All other systems reviewed and are otherwise negative except as noted above.    Blood pressure (!) 168/82, height 4\' 7"  (1.397 m), weight 137 lb 6.4 oz (62.3 kg).  General appearance: alert and no distress Neck: no adenopathy, no carotid bruit, no JVD, supple, symmetrical, trachea midline and thyroid not enlarged, symmetric, no tenderness/mass/nodules Lungs: clear to auscultation bilaterally Heart: regular rate and rhythm, S1, S2 normal, no murmur, click, rub or gallop Extremities: extremities normal, atraumatic, no  cyanosis or edema  EKG sinus rhythm at 87. I personally reviewed this EKG  ASSESSMENT AND PLAN:   Critical lower limb ischemia Ms. Ann Powell was referred to me by Dr. Dellia Nims for left third toe critical limb ischemia. She has exposed bone. Benign for at least a month. Her Dopplers performed 2 days ago revealed a right ABI 0.74 and a left upper 56. She has occluded left SFA, dorsalis pedis and posterior tibial artery. Renal function is normal. She'll need angiography and potential intervention for limb  salvage.  HTN (hypertension) History of essential hypertension blood pressure measures 116/82. She is on losartan 50 mg a day.      Runell Gess MD FACP,FACC,FAHA, [facility] 08/22/2016 2:43 PM

## 2016-08-22 NOTE — Assessment & Plan Note (Signed)
History of essential hypertension blood pressure measures 116/82. She is on losartan 50 mg a day.

## 2016-08-23 ENCOUNTER — Ambulatory Visit
Admission: RE | Admit: 2016-08-23 | Discharge: 2016-08-23 | Disposition: A | Discharge status: Home / Self Care | Payer: Medicare HMO | Source: Ambulatory Visit | Attending: Cardiovascular Disease | Admitting: Cardiovascular Disease

## 2016-08-23 DIAGNOSIS — I998 Other disorder of circulatory system: Secondary | ICD-10-CM

## 2016-08-23 DIAGNOSIS — Z01818 Encounter for other preprocedural examination: Secondary | ICD-10-CM | POA: Diagnosis not present

## 2016-08-23 DIAGNOSIS — I70229 Atherosclerosis of native arteries of extremities with rest pain, unspecified extremity: Secondary | ICD-10-CM

## 2016-08-23 LAB — TSH: TSH: 2.16 u[IU]/mL (ref 0.450–4.500)

## 2016-08-23 LAB — CBC WITH DIFFERENTIAL/PLATELET
BASOS ABS: 0 10*3/uL (ref 0.0–0.2)
BASOS: 1 %
EOS (ABSOLUTE): 0 10*3/uL (ref 0.0–0.4)
Eos: 1 %
HEMATOCRIT: 34.9 % (ref 34.0–46.6)
HEMOGLOBIN: 11.1 g/dL (ref 11.1–15.9)
Immature Grans (Abs): 0 10*3/uL (ref 0.0–0.1)
Immature Granulocytes: 1 %
LYMPHS ABS: 1.7 10*3/uL (ref 0.7–3.1)
LYMPHS: 36 %
MCH: 29.1 pg (ref 26.6–33.0)
MCHC: 31.8 g/dL (ref 31.5–35.7)
MCV: 92 fL (ref 79–97)
Monocytes Absolute: 0.3 10*3/uL (ref 0.1–0.9)
Monocytes: 6 %
NEUTROS ABS: 2.7 10*3/uL (ref 1.4–7.0)
Neutrophils: 55 %
Platelets: 544 10*3/uL — ABNORMAL HIGH (ref 150–379)
RBC: 3.81 x10E6/uL (ref 3.77–5.28)
RDW: 17.7 % — ABNORMAL HIGH (ref 12.3–15.4)
WBC: 4.8 10*3/uL (ref 3.4–10.8)

## 2016-08-23 LAB — BASIC METABOLIC PANEL
BUN/Creatinine Ratio: 14 (ref 12–28)
BUN: 12 mg/dL (ref 8–27)
CALCIUM: 10.2 mg/dL (ref 8.7–10.3)
CO2: 27 mmol/L (ref 18–29)
Chloride: 104 mmol/L (ref 96–106)
Creatinine, Ser: 0.83 mg/dL (ref 0.57–1.00)
GFR calc non Af Amer: 70 mL/min/{1.73_m2} (ref >59)
GFR, EST AFRICAN AMERICAN: 80 mL/min/{1.73_m2} (ref >59)
Glucose: 93 mg/dL (ref 65–99)
Potassium: 4.9 mmol/L (ref 3.5–5.2)
Sodium: 143 mmol/L (ref 134–144)

## 2016-08-23 LAB — APTT: APTT: 27 s (ref 24–33)

## 2016-08-23 LAB — PROTIME-INR
INR: 1 (ref 0.8–1.2)
Prothrombin Time: 10.9 s (ref 9.1–12.0)

## 2016-08-28 ENCOUNTER — Other Ambulatory Visit: Payer: Self-pay | Admitting: Cardiovascular Disease

## 2016-08-28 DIAGNOSIS — I998 Other disorder of circulatory system: Secondary | ICD-10-CM

## 2016-08-28 DIAGNOSIS — I70229 Atherosclerosis of native arteries of extremities with rest pain, unspecified extremity: Secondary | ICD-10-CM

## 2016-09-02 ENCOUNTER — Encounter (HOSPITAL_COMMUNITY)
Admission: RE | Disposition: A | Discharge status: Home / Self Care | Payer: Self-pay | Source: Ambulatory Visit | Attending: Cardiovascular Disease

## 2016-09-02 ENCOUNTER — Encounter (HOSPITAL_COMMUNITY): Payer: Self-pay | Admitting: Cardiovascular Disease

## 2016-09-02 ENCOUNTER — Ambulatory Visit (HOSPITAL_COMMUNITY)
Admission: RE | Admit: 2016-09-02 | Discharge: 2016-09-02 | Disposition: A | Discharge status: Home / Self Care | Payer: Medicare HMO | Source: Ambulatory Visit | Attending: Cardiovascular Disease | Admitting: Cardiovascular Disease

## 2016-09-02 DIAGNOSIS — I10 Essential (primary) hypertension: Secondary | ICD-10-CM | POA: Insufficient documentation

## 2016-09-02 DIAGNOSIS — I70201 Unspecified atherosclerosis of native arteries of extremities, right leg: Secondary | ICD-10-CM | POA: Diagnosis not present

## 2016-09-02 DIAGNOSIS — I70245 Atherosclerosis of native arteries of left leg with ulceration of other part of foot: Secondary | ICD-10-CM | POA: Insufficient documentation

## 2016-09-02 DIAGNOSIS — L97529 Non-pressure chronic ulcer of other part of left foot with unspecified severity: Secondary | ICD-10-CM | POA: Diagnosis not present

## 2016-09-02 DIAGNOSIS — I998 Other disorder of circulatory system: Secondary | ICD-10-CM | POA: Diagnosis present

## 2016-09-02 DIAGNOSIS — I70229 Atherosclerosis of native arteries of extremities with rest pain, unspecified extremity: Secondary | ICD-10-CM | POA: Diagnosis present

## 2016-09-02 DIAGNOSIS — I70248 Atherosclerosis of native arteries of left leg with ulceration of other part of lower left leg: Secondary | ICD-10-CM | POA: Diagnosis not present

## 2016-09-02 HISTORY — DX: Personal history of other diseases of the digestive system: Z87.19

## 2016-09-02 HISTORY — DX: Personal history of peptic ulcer disease: Z87.11

## 2016-09-02 HISTORY — PX: LOWER EXTREMITY INTERVENTION: CATH118252

## 2016-09-02 SURGERY — LOWER EXTREMITY INTERVENTION
Anesthesia: LOCAL

## 2016-09-02 MED ORDER — HYDRALAZINE HCL 20 MG/ML IJ SOLN
INTRAMUSCULAR | Status: AC
  Filled 2016-09-02: qty 1

## 2016-09-02 MED ORDER — LIDOCAINE HCL 1 % IJ SOLN
INTRAMUSCULAR | Status: AC
  Filled 2016-09-02: qty 20

## 2016-09-02 MED ORDER — MIDAZOLAM HCL 2 MG/2ML IJ SOLN
INTRAMUSCULAR | Status: DC | PRN
  Administered 2016-09-02: 1 mg via INTRAVENOUS

## 2016-09-02 MED ORDER — HYDRALAZINE HCL 20 MG/ML IJ SOLN: 10.0000 mg | INTRAMUSCULAR | Status: DC | PRN

## 2016-09-02 MED ORDER — HEPARIN (PORCINE) IN NACL 2-0.9 UNIT/ML-% IJ SOLN
INTRAMUSCULAR | Status: AC | PRN
  Administered 2016-09-02: 1000 mL

## 2016-09-02 MED ORDER — HYDRALAZINE HCL 20 MG/ML IJ SOLN
INTRAMUSCULAR | Status: DC | PRN
  Administered 2016-09-02 (×2): 10 mg via INTRAVENOUS

## 2016-09-02 MED ORDER — LOSARTAN POTASSIUM 50 MG PO TABS: 50.0000 mg | ORAL_TABLET | Freq: Every day | ORAL | Status: DC

## 2016-09-02 MED ORDER — SULFAMETHOXAZOLE-TRIMETHOPRIM 800-160 MG PO TABS
1.0000 | ORAL_TABLET | Freq: Two times a day (BID) | ORAL | Status: DC
  Administered 2016-09-02: 12:00:00 1 via ORAL
  Filled 2016-09-02 (×2): qty 1

## 2016-09-02 MED ORDER — MIDAZOLAM HCL 2 MG/2ML IJ SOLN
INTRAMUSCULAR | Status: AC
  Filled 2016-09-02: qty 2

## 2016-09-02 MED ORDER — TRAMADOL HCL 50 MG PO TABS
50.0000 mg | ORAL_TABLET | Freq: Two times a day (BID) | ORAL | Status: DC
  Administered 2016-09-02: 12:00:00 50 mg via ORAL
  Filled 2016-09-02: qty 1

## 2016-09-02 MED ORDER — HEPARIN (PORCINE) IN NACL 2-0.9 UNIT/ML-% IJ SOLN
INTRAMUSCULAR | Status: AC
  Filled 2016-09-02: qty 1000

## 2016-09-02 MED ORDER — SODIUM CHLORIDE 0.9 % WEIGHT BASED INFUSION
3.0000 mL/kg/h | INTRAVENOUS | Status: DC
  Administered 2016-09-02: 3 mL/kg/h via INTRAVENOUS

## 2016-09-02 MED ORDER — ASPIRIN 81 MG PO CHEW
81.0000 mg | CHEWABLE_TABLET | ORAL | Status: AC
  Administered 2016-09-02: 81 mg via ORAL

## 2016-09-02 MED ORDER — IODIXANOL 320 MG/ML IV SOLN
INTRAVENOUS | Status: DC | PRN
  Administered 2016-09-02: 112 mL via INTRA_ARTERIAL

## 2016-09-02 MED ORDER — FENTANYL CITRATE (PF) 100 MCG/2ML IJ SOLN
INTRAMUSCULAR | Status: AC
  Filled 2016-09-02: qty 2

## 2016-09-02 MED ORDER — IBUPROFEN 200 MG PO TABS
200.0000 mg | ORAL_TABLET | Freq: Once | ORAL | Status: DC
  Filled 2016-09-02: qty 1

## 2016-09-02 MED ORDER — IBUPROFEN 200 MG PO TABS: 200.0000 mg | ORAL_TABLET | Freq: Once | ORAL | Status: DC

## 2016-09-02 MED ORDER — HYDROCODONE-ACETAMINOPHEN 10-325 MG PO TABS: 1.0000 | ORAL_TABLET | Freq: Four times a day (QID) | ORAL | Status: DC | PRN

## 2016-09-02 MED ORDER — ASPIRIN 81 MG PO CHEW
CHEWABLE_TABLET | ORAL | Status: AC
  Administered 2016-09-02: 81 mg via ORAL
  Filled 2016-09-02: qty 1

## 2016-09-02 MED ORDER — FENTANYL CITRATE (PF) 100 MCG/2ML IJ SOLN
INTRAMUSCULAR | Status: DC | PRN
  Administered 2016-09-02: 25 ug via INTRAVENOUS

## 2016-09-02 MED ORDER — ASPIRIN EC 81 MG PO TBEC
81.0000 mg | DELAYED_RELEASE_TABLET | Freq: Every day | ORAL | Status: DC
  Administered 2016-09-02: 81 mg via ORAL
  Filled 2016-09-02: qty 1

## 2016-09-02 MED ORDER — ONDANSETRON HCL 4 MG/2ML IJ SOLN: 4.0000 mg | Freq: Four times a day (QID) | INTRAMUSCULAR | Status: DC | PRN

## 2016-09-02 MED ORDER — SODIUM CHLORIDE 0.9% FLUSH: 3.0000 mL | INTRAVENOUS | Status: DC | PRN

## 2016-09-02 MED ORDER — MORPHINE SULFATE (PF) 4 MG/ML IV SOLN: 2.0000 mg | INTRAVENOUS | Status: DC | PRN

## 2016-09-02 MED ORDER — LIDOCAINE HCL (PF) 1 % IJ SOLN
INTRAMUSCULAR | Status: DC | PRN
  Administered 2016-09-02: 15 mL

## 2016-09-02 MED ORDER — ACETAMINOPHEN 325 MG PO TABS: 650.0000 mg | ORAL_TABLET | ORAL | Status: DC | PRN

## 2016-09-02 MED ORDER — SODIUM CHLORIDE 0.9 % WEIGHT BASED INFUSION: 1.0000 mL/kg/h | INTRAVENOUS | Status: DC

## 2016-09-02 MED ORDER — SODIUM CHLORIDE 0.9 % IV SOLN: INTRAVENOUS | Status: DC

## 2016-09-02 SURGICAL SUPPLY — 9 items
CATH ANGIO 5F PIGTAIL 65CM (CATHETERS) ×2 IMPLANT
KIT PV (KITS) ×2 IMPLANT
SHEATH PINNACLE 5F 10CM (SHEATH) ×2 IMPLANT
STOPCOCK MORSE 400PSI 3WAY (MISCELLANEOUS) ×2 IMPLANT
SYRINGE MEDRAD AVANTA MACH 7 (SYRINGE) ×2 IMPLANT
TRANSDUCER W/STOPCOCK (MISCELLANEOUS) ×2 IMPLANT
TRAY PV CATH (CUSTOM PROCEDURE TRAY) ×2 IMPLANT
TUBING CIL FLEX 10 FLL-RA (TUBING) ×2 IMPLANT
WIRE HITORQ VERSACORE ST 145CM (WIRE) ×2 IMPLANT

## 2016-09-02 NOTE — Care Management Note (Signed)
Case Management Note  Patient Details  Name: Ann Powell MRN: [id number] Date of Birth: 1940/07/11  Subjective/Objective:  From home, with limb ischemia, pv cath done, no stent. For dc no needs.     PCP Lynnea FerrierWarren Pickard                 Action/Plan:   Expected Discharge Date:  09/02/16               Expected Discharge Plan:  Home/Self Care  In-House Referral:     Discharge planning Services  CM Consult  Post Acute Care Choice:    Choice offered to:     DME Arranged:    DME Agency:     HH Arranged:    HH Agency:     Status of Service:  Completed, signed off  If discussed at [employer] Length of Stay Meetings, dates discussed:    Additional Comments:  Leone Havenaylor, Rally Ouch Clinton, RN 09/02/2016, 4:25 PM

## 2016-09-02 NOTE — Interval H&P Note (Signed)
History and Physical Interval Note:  09/02/2016 8:00 AM  Ann Powell  has presented today for surgery, with the diagnosis of critical lower imb ischemia  The various methods of treatment have been discussed with the patient and family. After consideration of risks, benefits and other options for treatment, the patient has consented to  Procedure(s): Lower Extremity Intervention (N/A) as a surgical intervention .  The patient's history has been reviewed, patient examined, no change in status, stable for surgery.  I have reviewed the patient's chart and labs.  Questions were answered to the patient's satisfaction.     Nanetta BattyBerry, Wilhelm Ganaway

## 2016-09-02 NOTE — Discharge Summary (Signed)
Discharge Summary    Patient ID: Ann Powell,  MRN: [id number], DOB/AGE: Nov 20, 1940 76 y.o.  Admit date: 09/02/2016 Discharge date: 09/02/2016  Primary Care Provider: Lynnea FerrierPickard, Warren T Primary Cardiologist: Allyson SabalBerry  Discharge Diagnoses    Active Problems:   Critical lower limb ischemia   Allergies No Known Allergies  Diagnostic Studies/Procedures    PV angiogram 09/02/16:  Angiographic Data:   1: Abdominal aortogram-free of significant disease 2: Left lower extremity-left SFA was occluded just beyond its origin. The left popliteal was occluded as were the anterior posterior tibial arteries. The peroneal artery reconstituted by collaterals in its midportion. 3: Right lower extremity-2 vessel runoff with an occluded right anterior tibial artery  IMPRESSION: Ann Powell has critical limb ischemia with occluded left SFA, popliteal and tibial arteries. She has no endovascular or surgical revascularization options. At this point, I recommend aggressive local wound care. I suspect she will end up needing an amputation which will most likely not heal. The sheath was removed and pressure held on the groin to achieve hemostasis. The patient left the lab in stable condition.    Nanetta BattyBerry, Shailey Butterbaugh. 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 She was referred by Dr. Charlsie Merlesegal , her podiatrist, for peripheral vascular evaluation because of a nonhealing left third toe. Her only risk factor for cardiovascular disease include treated hypertension. She's never had a heart attack or stroke. She is not diabetic nor is she smoker. She's had a nonhealing wound on her left third toe last month and obtain Dopplers in the office that showed a left ABI 0.56 occluded left SFA and one-vessel runoff via the peroneal artery. Given this finding  she was referred for angiography.   Hospital Course     Underwent PV angiogram with Dr. Allyson SabalBerry noted above. Her right groin was stable post cath without bruising or hematoma. Instructed by Dr. Allyson SabalBerry that she could be a same day discharge as groin was stable. She was given post cath instructions, and restrictions. A message was sent to arrange follow up in the office with Dr. Allyson SabalBerry within the next week for follow up. No medication changes were made this admission.  _____________  Discharge Vitals Blood pressure (!) 126/58, pulse (!) 117, temperature 97 F (36.1 C), temperature source Oral, resp. rate 20, height 4\' 7"  (1.397 m), weight 137 lb (62.1 kg), SpO2 100 %.  Filed Weights   09/02/16 0559  Weight: 137 lb (62.1 kg)    Labs & Radiologic Studies    CBC No results for input(s): WBC, NEUTROABS, HGB, HCT, MCV, PLT in the last 72 hours. Basic Metabolic Panel No results for input(s): NA, K, CL, CO2, GLUCOSE, BUN, CREATININE, CALCIUM, MG, PHOS in the last 72 hours. Liver Function Tests No results for input(s): AST, ALT, ALKPHOS, BILITOT, PROT, ALBUMIN in the last 72 hours. No results for input(s): LIPASE, AMYLASE in the last 72 hours. Cardiac Enzymes No results for input(s): CKTOTAL, CKMB, CKMBINDEX, TROPONINI in the last 72 hours. BNP Invalid input(s): POCBNP D-Dimer No results for input(s): DDIMER in the last 72 hours. Hemoglobin A1C No results for input(s): HGBA1C in the last 72 hours. Fasting Lipid Panel No results for input(s): CHOL, HDL, LDLCALC, TRIG, CHOLHDL, LDLDIRECT in the last 72 hours. Thyroid Function Tests No results for input(s): TSH, T4TOTAL, T3FREE, THYROIDAB in the last  72 hours.  Invalid input(s): FREET3 _____________  Dg Chest 2 View  Result Date: 08/23/2016 CLINICAL DATA:  Preop for critical lower extremity ischemia. EXAM: CHEST  2 VIEW COMPARISON:  None. FINDINGS: The heart size and mediastinal contours are within normal limits. Both lungs are clear. The  visualized skeletal structures are unremarkable. IMPRESSION: No active cardiopulmonary disease. Electronically Signed   By: Lupita Raider, M.D.   On: 08/23/2016 13:28   Disposition   Pt is being discharged home today in good condition.  Follow-up Plans & Appointments    Follow-up Information    Runell Gess, MD Follow up.   Specialties:  Cardiology, Radiology Why:  The office will call you with a follow up appt. Please give [place] a call if you have not received a call within 48 hours.  Contact information: [address] [place] [address] [phone]          Discharge Instructions    Call MD for:  redness, tenderness, or signs of infection (pain, swelling, redness, odor or green/yellow discharge around incision site)    Complete by:  As directed    Diet - low sodium heart healthy    Complete by:  As directed    Discharge instructions    Complete by:  As directed    Groin Site Care Refer to this sheet in the next few weeks. These instructions provide you with information on caring for yourself after your procedure. Your caregiver may also give you more specific instructions. Your treatment has been planned according to current medical practices, but problems sometimes occur. Call your caregiver if you have any problems or questions after your procedure. HOME CARE INSTRUCTIONS You may shower 24 hours after the procedure. Remove the bandage (dressing) and gently wash the site with plain soap and water. Gently pat the site dry.  Do not apply powder or lotion to the site.  Do not sit in a bathtub, swimming pool, or whirlpool for 5 to 7 days.  No bending, squatting, or lifting anything over 10 pounds (4.5 kg) as directed by your caregiver.  Inspect the site at least twice daily.  Do not drive home if you are discharged the same day of the procedure. Have someone else drive you.  You may drive 24 hours after the procedure unless otherwise instructed by your  caregiver.  What to expect: Any bruising will usually fade within 1 to 2 weeks.  Blood that collects in the tissue (hematoma) may be painful to the touch. It should usually decrease in size and tenderness within 1 to 2 weeks.  SEEK IMMEDIATE MEDICAL CARE IF: You have unusual pain at the groin site or down the affected leg.  You have redness, warmth, swelling, or pain at the groin site.  You have drainage (other than a small amount of blood on the dressing).  You have chills.  You have a fever or persistent symptoms for more than 72 hours.  You have a fever and your symptoms suddenly get worse.  Your leg becomes pale, cool, tingly, or numb.  You have heavy bleeding from the site. Hold pressure on the site. .   Increase activity slowly    Complete by:  As directed       Discharge Medications   Current Discharge Medication List    CONTINUE these medications which have NOT CHANGED   Details  co-enzyme Q-10 30 MG capsule Take 30 mg by mouth daily.     ibuprofen (  ADVIL,MOTRIN) 200 MG tablet Take 200 mg by mouth 2 (two) times daily as needed for mild pain.    losartan (COZAAR) 50 MG tablet TAKE 1 TABLET EVERY DAY Qty: 90 tablet, Refills: 3    sulfamethoxazole-trimethoprim (BACTRIM DS,SEPTRA DS) 800-160 MG tablet Take 1 tablet by mouth 2 (two) times daily. Qty: 10 tablet, Refills: 0      STOP taking these medications     HYDROcodone-acetaminophen (NORCO) 10-325 MG tablet      traMADol (ULTRAM) 50 MG tablet           Outstanding Labs/Studies   BMET at follow up visit.   Duration of Discharge Encounter   Greater than 30 minutes including physician time.  Signed, Laverda Page NP-C 09/02/2016, 3:44 PM   Agree with note by Laverda Page NP-C  Groin evaluated by Geoffry Paradise this afternoon and appeared stable and ready for DC home. ROV with me 1-2  weeks  Runell Gess, M.D., FACP, [facility], [facility] Eriksson [facility] [address]  [address] [place], [place]  [address]  [phone] 09/03/2016 7:44 AM

## 2016-09-02 NOTE — H&P (View-Only) (Signed)
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 She was referred by Dr. Charlsie Merlesegal , her podiatrist, for peripheral vascular evaluation because of a nonhealing left third toe. Her only risk factor for cardiovascular disease include treated hypertension. She's never had a heart attack or stroke. She is not diabetic nor is she smoke. She's had a nonhealing wound on her left third toe fillet last month and obtain Dopplers in the office 2 days ago that showed a left ABI 0.56 occluded left SFA and one-vessel runoff via the peroneal artery.   Current Outpatient Prescriptions  Medication Sig Dispense Refill  . Alfalfa 650 MG TABS Take by mouth.    . co-enzyme Q-10 30 MG capsule Take 30 mg by mouth 3 (three) times daily.    . ferrous sulfate 325 (65 FE) MG tablet Take 325 mg by mouth daily with breakfast.    . FOLIC ACID PO Take 400 mg by mouth. 2 qam    . HYDROcodone-acetaminophen (NORCO) 10-325 MG tablet Take 1 tablet by mouth every 8 (eight) hours as needed. 30 tablet 0  . Krill Oil 1000 MG CAPS Take 1,000 mg by mouth daily.     Marland Kitchen. losartan (COZAAR) 50 MG tablet TAKE 1 TABLET EVERY DAY 90 tablet 3  . magnesium 30 MG tablet Take 500 mg by mouth daily.     Marland Kitchen. sulfamethoxazole-trimethoprim (BACTRIM DS,SEPTRA DS) 800-160 MG tablet Take 1 tablet by mouth 2 (two) times daily. 10 tablet 0  . traMADol (ULTRAM) 50 MG tablet Take 1 tablet (50 mg total) by mouth every 8 (eight) hours as needed. 30 tablet 0  . vitamin B-12 (CYANOCOBALAMIN) 500 MCG tablet Take 500 mcg by mouth daily.     No current facility-administered medications for this visit.     No Known Allergies  Social History   Social History  . Marital status: Single   Spouse name: N/A  . Number of children: N/A  . Years of education: N/A   Occupational History  . Not on file.   Social History Main Topics  . Smoking status: Never Smoker  . Smokeless tobacco: Never Used  . Alcohol use No  . Drug use: No  . Sexual activity: No   Other Topics Concern  . Not on file   Social History Narrative  . No narrative on file     Review of Systems: General: negative for chills, fever, night sweats or weight changes.  Cardiovascular: negative for chest pain, dyspnea on exertion, edema, orthopnea, palpitations, paroxysmal nocturnal dyspnea or shortness of breath Dermatological: negative for rash Respiratory: negative for cough or wheezing Urologic: negative for hematuria Abdominal: negative for nausea, vomiting, diarrhea, bright red blood per rectum, melena, or hematemesis Neurologic: negative for visual changes, syncope, or dizziness All other systems reviewed and are otherwise negative except as noted above.    Blood pressure (!) 168/82, height 4\' 7"  (1.397 m), weight 137 lb 6.4 oz (62.3 kg).  General appearance: alert and no distress Neck: no adenopathy, no carotid bruit, no JVD, supple, symmetrical, trachea midline and thyroid not enlarged, symmetric, no tenderness/mass/nodules Lungs: clear to auscultation bilaterally Heart: regular rate and rhythm, S1, S2 normal, no murmur, click, rub or gallop Extremities: extremities normal, atraumatic, no  cyanosis or edema  EKG sinus rhythm at 87. I personally reviewed this EKG  ASSESSMENT AND PLAN:   Critical lower limb ischemia Ann Powell was referred to me by Dr. Dellia Nims for left third toe critical limb ischemia. She has exposed bone. Benign for at least a month. Her Dopplers performed 2 days ago revealed a right ABI 0.74 and a left upper 56. She has occluded left SFA, dorsalis pedis and posterior tibial artery. Renal function is normal. She'll need angiography and potential intervention for limb  salvage.  HTN (hypertension) History of essential hypertension blood pressure measures 116/82. She is on losartan 50 mg a day.      Runell Gess MD FACP,FACC,FAHA, [facility] 08/22/2016 2:43 PM

## 2016-09-10 ENCOUNTER — Ambulatory Visit (INDEPENDENT_AMBULATORY_CARE_PROVIDER_SITE_OTHER): Payer: Medicare HMO | Admitting: Cardiovascular Disease

## 2016-09-10 ENCOUNTER — Encounter: Payer: Self-pay | Admitting: Cardiovascular Disease

## 2016-09-10 DIAGNOSIS — I998 Other disorder of circulatory system: Secondary | ICD-10-CM | POA: Diagnosis not present

## 2016-09-10 DIAGNOSIS — I70229 Atherosclerosis of native arteries of extremities with rest pain, unspecified extremity: Secondary | ICD-10-CM

## 2016-09-10 NOTE — Progress Notes (Signed)
Ms. Ann Powell returns today after her recent angiogram performed on 09/02/16 for critical limb ischemia. She has gangrenous changes on her left toes. Her angiogram revealed occluded SFA from the origin all the way down through the popliteal artery with 0 vessel runoff. The only patent vessel below the knee is a peroneal vessel that fills by collaterals. I'm going to refer her to Dr. George Adams for attempt at revascularization for limb salvage. 

## 2016-09-10 NOTE — Assessment & Plan Note (Addendum)
Ms. Ann Powell returns today after her recent angiogram performed on 09/02/16 for critical limb ischemia. She has gangrenous changes on her left toes. Her angiogram revealed occluded SFA from the origin all the way down through the popliteal artery with 0 vessel runoff. The only patent vessel below the knee is a peroneal vessel that fills by collaterals. I'm going to refer her to Dr. Hoy FinlayGeorge Adams for attempt at revascularization for limb salvage.

## 2016-09-10 NOTE — Patient Instructions (Signed)
Medication Instructions: Your physician recommends that you continue on your current medications as directed. Please refer to the Current Medication list given to you today.   Follow-Up: You have been referred to [facility]layGeorge [facility]. Dr. Pernell DupreAdams will be calling you to set this up.  Your physician recommends that you schedule a follow-up appointment in: 3 months with Dr. Allyson SabalBerry.  If you need a refill on your cardiac medications before your next appointment, please call your pharmacy.

## 2016-09-25 ENCOUNTER — Other Ambulatory Visit: Payer: Self-pay | Admitting: Cardiovascular Disease

## 2016-09-25 DIAGNOSIS — I771 Stricture of artery: Secondary | ICD-10-CM

## 2016-09-25 DIAGNOSIS — I998 Other disorder of circulatory system: Secondary | ICD-10-CM | POA: Diagnosis not present

## 2016-09-25 DIAGNOSIS — M79609 Pain in unspecified limb: Secondary | ICD-10-CM | POA: Diagnosis not present

## 2016-09-25 DIAGNOSIS — I739 Peripheral vascular disease, unspecified: Secondary | ICD-10-CM | POA: Diagnosis not present

## 2016-09-25 DIAGNOSIS — R600 Localized edema: Secondary | ICD-10-CM | POA: Diagnosis not present

## 2016-09-25 DIAGNOSIS — I96 Gangrene, not elsewhere classified: Secondary | ICD-10-CM | POA: Insufficient documentation

## 2016-09-26 ENCOUNTER — Telehealth: Payer: Self-pay | Admitting: Cardiovascular Disease

## 2016-09-26 ENCOUNTER — Ambulatory Visit (INDEPENDENT_AMBULATORY_CARE_PROVIDER_SITE_OTHER)
Admission: RE | Admit: 2016-09-26 | Discharge: 2016-09-26 | Disposition: A | Discharge status: Home / Self Care | Payer: Medicare HMO | Source: Ambulatory Visit | Attending: Cardiovascular Disease | Admitting: Cardiovascular Disease

## 2016-09-26 DIAGNOSIS — R935 Abnormal findings on diagnostic imaging of other abdominal regions, including retroperitoneum: Secondary | ICD-10-CM | POA: Diagnosis not present

## 2016-09-26 DIAGNOSIS — I771 Stricture of artery: Secondary | ICD-10-CM | POA: Diagnosis not present

## 2016-09-26 MED ORDER — IOPAMIDOL (ISOVUE-300) INJECTION 61%
100.0000 mL | Freq: Once | INTRAVENOUS | Status: AC | PRN
  Administered 2016-09-26: 100 mL via INTRAVENOUS

## 2016-09-26 NOTE — Telephone Encounter (Signed)
Called patient and informed her of her appointment for her CT abdomen at [facility] at 11:45.

## 2016-09-30 DIAGNOSIS — M79672 Pain in left foot: Secondary | ICD-10-CM | POA: Diagnosis not present

## 2016-09-30 DIAGNOSIS — I10 Essential (primary) hypertension: Secondary | ICD-10-CM | POA: Diagnosis not present

## 2016-09-30 DIAGNOSIS — I739 Peripheral vascular disease, unspecified: Secondary | ICD-10-CM | POA: Diagnosis not present

## 2016-09-30 DIAGNOSIS — R600 Localized edema: Secondary | ICD-10-CM | POA: Diagnosis not present

## 2016-09-30 DIAGNOSIS — I998 Other disorder of circulatory system: Secondary | ICD-10-CM | POA: Diagnosis not present

## 2016-09-30 DIAGNOSIS — I771 Stricture of artery: Secondary | ICD-10-CM | POA: Diagnosis not present

## 2016-09-30 DIAGNOSIS — I70211 Atherosclerosis of native arteries of extremities with intermittent claudication, right leg: Secondary | ICD-10-CM | POA: Diagnosis not present

## 2016-10-01 DIAGNOSIS — R600 Localized edema: Secondary | ICD-10-CM | POA: Diagnosis not present

## 2016-10-01 DIAGNOSIS — I70211 Atherosclerosis of native arteries of extremities with intermittent claudication, right leg: Secondary | ICD-10-CM | POA: Diagnosis not present

## 2016-10-01 DIAGNOSIS — I10 Essential (primary) hypertension: Secondary | ICD-10-CM | POA: Diagnosis not present

## 2016-10-01 DIAGNOSIS — M79672 Pain in left foot: Secondary | ICD-10-CM | POA: Diagnosis not present

## 2016-10-01 DIAGNOSIS — I739 Peripheral vascular disease, unspecified: Secondary | ICD-10-CM | POA: Diagnosis not present

## 2016-10-04 ENCOUNTER — Other Ambulatory Visit: Payer: Self-pay | Admitting: Family Medicine

## 2016-10-15 ENCOUNTER — Ambulatory Visit (INDEPENDENT_AMBULATORY_CARE_PROVIDER_SITE_OTHER): Payer: Medicare HMO | Admitting: Family Medicine

## 2016-10-15 ENCOUNTER — Encounter: Payer: Self-pay | Admitting: Family Medicine

## 2016-10-15 VITALS — BP 164/92 | HR 100 | Temp 98.6°F | Resp 12 | Ht <= 58 in | Wt 131.0 lb

## 2016-10-15 DIAGNOSIS — I96 Gangrene, not elsewhere classified: Secondary | ICD-10-CM | POA: Diagnosis not present

## 2016-10-15 DIAGNOSIS — M86672 Other chronic osteomyelitis, left ankle and foot: Secondary | ICD-10-CM | POA: Diagnosis not present

## 2016-10-15 MED ORDER — CLOPIDOGREL BISULFATE 75 MG PO TABS: 75.0000 mg | ORAL_TABLET | Freq: Every day | ORAL | 3 refills | Status: AC

## 2016-10-15 NOTE — Addendum Note (Signed)
Addended by: Legrand RamsWILLIS, SANDY B on: 10/15/2016 04:35 PM   Modules accepted: Orders

## 2016-10-15 NOTE — Progress Notes (Signed)
Subjective:    Patient ID: Ann Powell, female    DOB: Mar 31, 1941, 76 y.o.   MRN: [id number]  HPI I have copied recent discharge summary and included it for reference: Admit date: 09/02/2016 Discharge date: 09/02/2016  Primary Care Provider: Lynnea Ferrier Powell Primary Cardiologist: Ann Powell  Discharge Diagnoses    Active Problems:   Critical lower limb ischemia   Allergies No Known Allergies  Diagnostic Studies/Procedures    PV angiogram 09/02/16:  Angiographic Data:   1: Abdominal aortogram-free of significant disease 2: Left lower extremity-left SFA was occluded just beyond its origin. The left popliteal was occluded as were the anterior posterior tibial arteries. The peroneal artery reconstituted by collaterals in its midportion. 3: Right lower extremity-2 vessel runoff with an occluded right anterior tibial artery  IMPRESSION:Ann Powell has critical limb ischemia with occluded left SFA, popliteal and tibial arteries. She has no endovascular or surgical revascularization options. At this point, I recommend aggressive local wound care. I suspect she will end up needing an amputation which will most likely not heal. The sheath was removed and pressure held on the groin to achieve hemostasis. The patient left the lab in stable condition.    Ann Powell. 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 She was referred by Dr. Charlsie Powell , her podiatrist, for peripheral vascular evaluation because of a nonhealing left third toe. Her only risk factor for cardiovascular disease include treated hypertension. She's never had a heart attack or stroke. She is not diabetic nor is she smoker. She's had a nonhealing wound on her left third toe last month and obtain Dopplers in the office that showed a left ABI 0.56  occluded left SFA and one-vessel runoff via the peroneal artery. Given this finding she was referred for angiography.   Hospital Course     Underwent PV angiogram with Dr. Allyson Powell noted above. Her right groin was stable post cath without bruising or hematoma. Instructed by Dr. Allyson Powell that she could be a same day discharge as groin was stable. She was given post cath instructions, and restrictions. A message was sent to arrange follow up in the office with Dr. Allyson Powell within the next week for follow up. No medication changes were made this admission.    Patient saw Dr. Allyson Powell on 6/12 and he referred her to Ann Powell  At [facility] for an attempt at revascularization for limb salvage.    Patient underwent bypass surgery/revascularization on July 2. Unfortunately I do not have a copy of her operative note to know the details of the procedure. She is here today for follow-up. Patient continues to have a nonhealing wound on her third and fourth toes on her left foot. The distal tip of her third toe appears gangrenous with a foul odor. It is cool to the touch. There is no erythema. There is chronic swelling and induration. There is foul-smelling opaque yellow drainage coming from an open wound near where the toenail used to be. 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 The fourth toe is not as severe but is also involved.  Past Medical History:  Diagnosis Date  . History of gastric ulcer    "after hysterectomy"  . Hypertension   . Ulcer    Past Surgical History:  Procedure Laterality Date  . ABDOMINAL HYSTERECTOMY    . LOWER EXTREMITY  INTERVENTION N/A 09/02/2016   Procedure: Lower Extremity Intervention;  Surgeon: Ann Powell;  [facility];  Service: Cardiovascular;  Laterality: N/A;  . TONSILLECTOMY     Current Outpatient Prescriptions on File Prior to Visit  Medication Sig Dispense Refill  . co-enzyme Q-10 30 MG capsule Take 30 mg by mouth daily.     Marland Kitchen. losartan (COZAAR) 50 MG  tablet TAKE 1 TABLET EVERY DAY 90 tablet 3   No current facility-administered medications on file prior to visit.    No Known Allergies Social History   Social History  . Marital status: Single    Spouse name: N/A  . Number of children: N/A  . Years of education: N/A   Occupational History  . Not on file.   Social History Main Topics  . Smoking status: Never Smoker  . Smokeless tobacco: Never Used  . Alcohol use No  . Drug use: No  . Sexual activity: No   Other Topics Concern  . Not on file   Social History Narrative  . No narrative on file      Review of Systems  All other systems reviewed and are negative.      Objective:   Physical Exam  Cardiovascular: Normal rate, regular rhythm and normal heart sounds.   Pulmonary/Chest: Effort normal and breath sounds normal.  Abdominal: Soft. Bowel sounds are normal.  Musculoskeletal:       Feet:  Vitals reviewed.         Assessment & Plan:   Patient appears to have chronic osteomyelitis/gangrene in the third fourth toes. Despite revascularization, the wound is not healing and the third toe. Furthermore she is having chronic pain in that area although there is no evidence of systemic infection. Unfortunately I feel that these toes need to be amputated. I will consult her podiatrist, Dr. Charlsie Merlesegal to discuss amputation options and to determine the extent of the necessary procedure.  I feel a wound clinic would not be beneficial at this point.

## 2016-10-16 ENCOUNTER — Ambulatory Visit (INDEPENDENT_AMBULATORY_CARE_PROVIDER_SITE_OTHER): Payer: Medicare HMO | Admitting: Podiatry

## 2016-10-16 ENCOUNTER — Ambulatory Visit (INDEPENDENT_AMBULATORY_CARE_PROVIDER_SITE_OTHER): Payer: Medicare HMO

## 2016-10-16 DIAGNOSIS — L03032 Cellulitis of left toe: Secondary | ICD-10-CM | POA: Diagnosis not present

## 2016-10-16 DIAGNOSIS — M869 Osteomyelitis, unspecified: Secondary | ICD-10-CM

## 2016-10-16 DIAGNOSIS — M79672 Pain in left foot: Secondary | ICD-10-CM

## 2016-10-16 NOTE — Patient Instructions (Signed)

## 2016-10-16 NOTE — Progress Notes (Signed)
   HPI: 76 year old female presents today for evaluation of ulcers to the third and fourth digits of the left foot. Patient has a history of peripheral vascular disease and was seen by vascular and had a stent recently put in the left lower extremity to improve circulation. Patient presents today for evaluation of left foot multiple ulcers for further management.  Past Medical History:  Diagnosis Date  . History of gastric ulcer    "after hysterectomy"  . Hypertension   . Ulcer    Physical Exam: General: The patient is alert and oriented x3 in no acute distress.  Dermatology: Ulcers noted to the distal tufts of digits 3-4 of the left foot. Ulcers are extremely sensitive to touch. Ulcers were are 100% fibrotic with a heavy amount of green purulent drainage and malodor. Localized cellulitis with edema and erythema also noted to digits 3-4 of the left foot. There is also purulent drainage with irritation noted to the left great toenail consistent with paronychia and localized infection.  Vascular: Patient is currently being managed by vascular. Patient recently had of peripheral vascular catheterization on 09/02/2016.  Neurological: Epicritic and protective threshold grossly intact bilaterally.   Musculoskeletal Exam: Range of motion within normal limits to all pedal and ankle joints bilateral. Muscle strength 5/5 in all groups bilateral.   Radiographic Exam:  Cortical destruction with degenerative changes noted to the distal tuft of the third digit left foot. There is also some suspicious cortical irregularities to the distal tuft of the fourth digit left foot. These changes are indicated of osteomyelitis. There is some subtle radio lucency also noted within the digits possibly due to gangrenous changes  Plan of Care: 1. Patient was evaluated today. X-rays reviewed 2. Today I discussed with the patient the urgent attention required relating to gas gangrene and osteomyelitis. Patient  understands that she is at high risk for possible more proximal limb loss. Today were going to be aggressive and schedule the patient for surgical intervention tomorrow. All possible complications and details the procedure were explained. No guarantees were expressed or implied. All patient questions were answered. 3. Surgery will consist of: Toe amputation digits 3-4 left foot. Total temporary nail avulsion left great toenail. Incision and drainage left foot. 4. Return to clinic 1 week postop  Dr. Felecia ShellingBrent M. Shelley Pooley, DPM    [address]                                        [place], [place] [address]                Office [phone]  Fax [phone]) [phone]

## 2016-10-17 ENCOUNTER — Encounter: Payer: Self-pay | Admitting: Podiatry

## 2016-10-17 ENCOUNTER — Telehealth: Payer: Self-pay | Admitting: *Deleted

## 2016-10-17 DIAGNOSIS — I10 Essential (primary) hypertension: Secondary | ICD-10-CM | POA: Diagnosis not present

## 2016-10-17 DIAGNOSIS — M86672 Other chronic osteomyelitis, left ankle and foot: Secondary | ICD-10-CM

## 2016-10-17 DIAGNOSIS — L600 Ingrowing nail: Secondary | ICD-10-CM | POA: Diagnosis not present

## 2016-10-17 DIAGNOSIS — M868X7 Other osteomyelitis, ankle and foot: Secondary | ICD-10-CM | POA: Diagnosis not present

## 2016-10-17 DIAGNOSIS — B351 Tinea unguium: Secondary | ICD-10-CM | POA: Diagnosis not present

## 2016-10-17 DIAGNOSIS — M86172 Other acute osteomyelitis, left ankle and foot: Secondary | ICD-10-CM | POA: Diagnosis not present

## 2016-10-17 DIAGNOSIS — L02612 Cutaneous abscess of left foot: Secondary | ICD-10-CM | POA: Diagnosis not present

## 2016-10-17 DIAGNOSIS — L03039 Cellulitis of unspecified toe: Secondary | ICD-10-CM | POA: Diagnosis not present

## 2016-10-17 NOTE — Telephone Encounter (Signed)
"  We haven't been able to get in contact with Ms. Ann Powell.  We need to move her arrival time up.  No one is answering the phone.  Do you have another number for her?  No, I will try to call her.  I am calling to let you know that Ann Powell from the surgical center has been trying to call you.  Can you give her a call?  "Yes, what's her number?"  You can reach her at [phone].  "I'll give her a call."

## 2016-10-18 DIAGNOSIS — L600 Ingrowing nail: Secondary | ICD-10-CM | POA: Diagnosis not present

## 2016-10-23 ENCOUNTER — Telehealth: Payer: Self-pay | Admitting: *Deleted

## 2016-10-23 ENCOUNTER — Encounter: Payer: Self-pay | Admitting: Podiatry

## 2016-10-23 ENCOUNTER — Ambulatory Visit (INDEPENDENT_AMBULATORY_CARE_PROVIDER_SITE_OTHER): Payer: Medicare HMO | Admitting: Podiatry

## 2016-10-23 ENCOUNTER — Ambulatory Visit (INDEPENDENT_AMBULATORY_CARE_PROVIDER_SITE_OTHER): Payer: Medicare HMO

## 2016-10-23 VITALS — BP 162/82 | HR 78

## 2016-10-23 DIAGNOSIS — L97522 Non-pressure chronic ulcer of other part of left foot with fat layer exposed: Secondary | ICD-10-CM | POA: Diagnosis not present

## 2016-10-23 DIAGNOSIS — I70245 Atherosclerosis of native arteries of left leg with ulceration of other part of foot: Secondary | ICD-10-CM

## 2016-10-23 DIAGNOSIS — Z9889 Other specified postprocedural states: Secondary | ICD-10-CM | POA: Diagnosis not present

## 2016-10-23 MED ORDER — CIPROFLOXACIN HCL 500 MG PO TABS: 500.0000 mg | ORAL_TABLET | Freq: Two times a day (BID) | ORAL | 0 refills | Status: DC

## 2016-10-23 NOTE — Addendum Note (Signed)
Addended by: Marylou MccoyQUINTANA, Marsha Gundlach L on: 10/23/2016 01:55 PM   Modules accepted: Orders

## 2016-10-23 NOTE — Progress Notes (Signed)
   Subjective:  Patient presents today status post toe amputation the third digit left foot along with total temporary nail avulsion left great toe. Date of surgery 10/17/2016. Patient states that she feels much better. She states the pain has improved significantly since before surgery. Patient has no complaints at this time.   Objective/Physical Exam Skin incisions appear to be well coapted with staples intact. No sign of infectious process noted. No dehiscence. No active bleeding noted. Moderate edema noted to the surgical extremity.  Wound #1 noted to the distal tuft of fourth digit left foot measuring approximately 1.01.00.1 cm. Wound #2 noted to the distal tuft of the fifth digit left foot measuring approximately 0.8x0.8x0.1 cm. To the noted ulcerations there is no eschar. There is a moderate amount of slough fibronecrotic tissue with serous drainage noted. There is no exposed bone muscle-tendon ligament or joint. Granulation tissue and wound base is pink. Periwound integrity is intact.   Radiographic Exam:  Surgical amputation site appears stable and intact. No cortical erosions or sign of osteomyelitis  Assessment: 1. s/p third digit amputation left foot with total temporary nail avulsion of great toe.   Plan of Care:  1. Patient was evaluated. X-rays reviewed 2. Medically necessary excisional debridement including subcutaneous tissue was performed to the ulceration sites. Excisional debridement of all the necrotic nonviable tissue down to healthy bleeding viable tissue was performed with post-debridement measurements and was pre-.   3. Iodine ointment was applied to the incision site as well as ulcers followed by dry sterile dressing.  4. Today were going to place orders for home health dressing changes every other day 6 weeks. 5. Continue taking oral Bactrim DS until instructed otherwise. We are currently trying to obtain culture results from surgical Center. We will modify  advised accordingly 6. Continue minimal weightbearing in the postoperative shoe 7. Return to clinic in 1 week   Felecia ShellingBrent M. Evans, DPM [facility]  Dr. Felecia ShellingBrent M. Evans, DPM    [address]                                        [place], [place] [address]                Office [phone]  Fax [phone]

## 2016-10-23 NOTE — Telephone Encounter (Addendum)
-----   Message from Felecia ShellingBrent M Evans, DPM sent at 10/23/2016 11:36 AM EDT ----- Regarding: Home health nurse dressing changes Please order home health nurse dressing changes for patient 3x/week x 6 weeks  Dx: Status post third digit amputation left foot. 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   - Cleanse with normal saline. Dry. - Paint periwound with betadine. Paint incision site with Betadine.  - Applied Aquacel Ag to ulcers. Dressed with dry sterile dressing.  Thanks, Dr. Logan BoresEvans. I spoke with pt, she is not established with a HHC agency. Faxed required form, clinicals and demographics to [facility].

## 2016-10-25 ENCOUNTER — Encounter: Payer: Self-pay | Admitting: Podiatry

## 2016-10-27 DIAGNOSIS — I70245 Atherosclerosis of native arteries of left leg with ulceration of other part of foot: Secondary | ICD-10-CM | POA: Diagnosis not present

## 2016-10-27 DIAGNOSIS — L97522 Non-pressure chronic ulcer of other part of left foot with fat layer exposed: Secondary | ICD-10-CM | POA: Diagnosis not present

## 2016-10-28 ENCOUNTER — Telehealth: Payer: Self-pay | Admitting: Podiatry

## 2016-10-28 NOTE — Telephone Encounter (Signed)
Ann Powell with [facility] called back in regards to needing verbal orders for pt's wound care.

## 2016-10-28 NOTE — Telephone Encounter (Signed)
I informed Ann Powell - [facility] we have orders for iodine and dry dressing to the wounds for 3 x week for 6 weeks 10/23/2016.

## 2016-10-28 NOTE — Telephone Encounter (Signed)
Hi, this is Yevonne Paxarrie Epps with [facility]. We need to get orders for plan of care approval for three times a week for six weeks for wound care . Please call me back at [phone] with the verbal order. If I'm unable to answer, please leave a message on my secure line. Thank you.

## 2016-10-30 ENCOUNTER — Ambulatory Visit (INDEPENDENT_AMBULATORY_CARE_PROVIDER_SITE_OTHER): Payer: Medicare HMO | Admitting: Podiatry

## 2016-10-30 DIAGNOSIS — Z9889 Other specified postprocedural states: Secondary | ICD-10-CM

## 2016-10-31 ENCOUNTER — Other Ambulatory Visit: Payer: Self-pay | Admitting: Cardiovascular Disease

## 2016-10-31 DIAGNOSIS — I739 Peripheral vascular disease, unspecified: Secondary | ICD-10-CM

## 2016-11-01 ENCOUNTER — Other Ambulatory Visit: Payer: Self-pay | Admitting: Cardiovascular Disease

## 2016-11-01 DIAGNOSIS — Z79891 Long term (current) use of opiate analgesic: Secondary | ICD-10-CM | POA: Diagnosis not present

## 2016-11-01 DIAGNOSIS — Z4800 Encounter for change or removal of nonsurgical wound dressing: Secondary | ICD-10-CM | POA: Diagnosis not present

## 2016-11-01 DIAGNOSIS — Z4781 Encounter for orthopedic aftercare following surgical amputation: Secondary | ICD-10-CM | POA: Diagnosis not present

## 2016-11-01 DIAGNOSIS — L97522 Non-pressure chronic ulcer of other part of left foot with fat layer exposed: Secondary | ICD-10-CM | POA: Diagnosis not present

## 2016-11-01 DIAGNOSIS — I10 Essential (primary) hypertension: Secondary | ICD-10-CM | POA: Diagnosis not present

## 2016-11-01 DIAGNOSIS — Z7982 Long term (current) use of aspirin: Secondary | ICD-10-CM | POA: Diagnosis not present

## 2016-11-01 DIAGNOSIS — Z7902 Long term (current) use of antithrombotics/antiplatelets: Secondary | ICD-10-CM | POA: Diagnosis not present

## 2016-11-01 DIAGNOSIS — I70245 Atherosclerosis of native arteries of left leg with ulceration of other part of foot: Secondary | ICD-10-CM | POA: Diagnosis not present

## 2016-11-01 DIAGNOSIS — Z9181 History of falling: Secondary | ICD-10-CM | POA: Diagnosis not present

## 2016-11-01 DIAGNOSIS — I739 Peripheral vascular disease, unspecified: Secondary | ICD-10-CM

## 2016-11-01 NOTE — Progress Notes (Signed)
   Subjective:  Patient presents today status post toe amputation the third digit left foot along with total temporary nail avulsion left great toe. Date of surgery 10/17/2016. Patient does have an issue with home health dressing changes and she believes they're not coming to the house likely should. They have not been all week. Other than that the patient has no complaints and she is doing very well.    Objective/Physical Exam Skin incisions appear to be well coapted with staples intact. No sign of infectious process noted. No dehiscence. No active bleeding noted. Moderate edema noted to the surgical extremity.  Wound #1 noted to the distal tuft of fourth digit left foot measuring approxima0.80.8 1 cm. Wound #2 noted to the distal tuft of the fifth digit left foot measuring approximately0.60.6x0.1 cm. To the noted ulcerations there is no eschar. There is a moderate amount of slough fibronecrotic tissue with serous drainage noted. There is no exposed bone muscle-tendon ligament or joint. Granulation tissue and wound base is pink. Periwound integrity is intact.   Radiographic Exam:  Surgical amputation site appears stable and intact. No cortical erosions or sign of osteomyelitis  Assessment: 1. s/p third digit amputation left foot with total temporary nail avulsion of great toe.   Plan of Care:  1. Patient was evaluated. X-rays reviewed 2. Today the dressings were changed. All wounds appear very viable and stable. She is improving significantly. We will follow up with home health dressing changes.   3. Keep dressings clean dry and intact until next scheduled appointment or until home health comes to the home. 4. Return to clinic in 10 days for suture removal  Felecia ShellingBrent M. Janeice Stegall, DPM [facility]  Dr. Felecia ShellingBrent M. Quantrell Splitt, DPM    [address]                                        [place], [place] [address]                Office [phone](336) [phone]  Fax [phone]

## 2016-11-04 DIAGNOSIS — Z7902 Long term (current) use of antithrombotics/antiplatelets: Secondary | ICD-10-CM | POA: Diagnosis not present

## 2016-11-04 DIAGNOSIS — Z9181 History of falling: Secondary | ICD-10-CM | POA: Diagnosis not present

## 2016-11-04 DIAGNOSIS — Z79891 Long term (current) use of opiate analgesic: Secondary | ICD-10-CM | POA: Diagnosis not present

## 2016-11-04 DIAGNOSIS — I70245 Atherosclerosis of native arteries of left leg with ulceration of other part of foot: Secondary | ICD-10-CM | POA: Diagnosis not present

## 2016-11-04 DIAGNOSIS — L97522 Non-pressure chronic ulcer of other part of left foot with fat layer exposed: Secondary | ICD-10-CM | POA: Diagnosis not present

## 2016-11-04 DIAGNOSIS — I10 Essential (primary) hypertension: Secondary | ICD-10-CM | POA: Diagnosis not present

## 2016-11-04 DIAGNOSIS — Z4781 Encounter for orthopedic aftercare following surgical amputation: Secondary | ICD-10-CM | POA: Diagnosis not present

## 2016-11-04 DIAGNOSIS — Z7982 Long term (current) use of aspirin: Secondary | ICD-10-CM | POA: Diagnosis not present

## 2016-11-04 DIAGNOSIS — Z4800 Encounter for change or removal of nonsurgical wound dressing: Secondary | ICD-10-CM | POA: Diagnosis not present

## 2016-11-04 NOTE — Telephone Encounter (Addendum)
-----   Message from Felecia ShellingBrent M Evans, DPM sent at 11/01/2016  6:42 PM EDT ----- Regarding: Home health dressing change issue Joya SanValery,  We please follow up with home health dressing changes. The patient states that they are not coming to the home although they said they would. Orders should be active. Continue original orders. Thanks, Dr. Logan BoresEvans. 11/04/2016-I spoke with Herbert SetaHeather - [facility] and she states pt's start up was 10/27/2016, appt missed 10/29/2016, pt seen 10/31/2016, and is scheduled to be seen today.

## 2016-11-06 DIAGNOSIS — M79609 Pain in unspecified limb: Secondary | ICD-10-CM | POA: Diagnosis not present

## 2016-11-06 DIAGNOSIS — I998 Other disorder of circulatory system: Secondary | ICD-10-CM | POA: Diagnosis not present

## 2016-11-06 DIAGNOSIS — I739 Peripheral vascular disease, unspecified: Secondary | ICD-10-CM | POA: Diagnosis not present

## 2016-11-06 DIAGNOSIS — R600 Localized edema: Secondary | ICD-10-CM | POA: Diagnosis not present

## 2016-11-08 ENCOUNTER — Ambulatory Visit (HOSPITAL_COMMUNITY)
Admission: RE | Admit: 2016-11-08 | Payer: Medicare HMO | Source: Ambulatory Visit | Attending: Cardiovascular Disease | Admitting: Cardiovascular Disease

## 2016-11-08 ENCOUNTER — Encounter (HOSPITAL_COMMUNITY): Payer: Medicare HMO

## 2016-11-08 DIAGNOSIS — I10 Essential (primary) hypertension: Secondary | ICD-10-CM | POA: Diagnosis not present

## 2016-11-08 DIAGNOSIS — Z7902 Long term (current) use of antithrombotics/antiplatelets: Secondary | ICD-10-CM | POA: Diagnosis not present

## 2016-11-08 DIAGNOSIS — L97522 Non-pressure chronic ulcer of other part of left foot with fat layer exposed: Secondary | ICD-10-CM | POA: Diagnosis not present

## 2016-11-08 DIAGNOSIS — Z79891 Long term (current) use of opiate analgesic: Secondary | ICD-10-CM | POA: Diagnosis not present

## 2016-11-08 DIAGNOSIS — Z4781 Encounter for orthopedic aftercare following surgical amputation: Secondary | ICD-10-CM | POA: Diagnosis not present

## 2016-11-08 DIAGNOSIS — Z9181 History of falling: Secondary | ICD-10-CM | POA: Diagnosis not present

## 2016-11-08 DIAGNOSIS — Z4800 Encounter for change or removal of nonsurgical wound dressing: Secondary | ICD-10-CM | POA: Diagnosis not present

## 2016-11-08 DIAGNOSIS — Z7982 Long term (current) use of aspirin: Secondary | ICD-10-CM | POA: Diagnosis not present

## 2016-11-08 DIAGNOSIS — I70245 Atherosclerosis of native arteries of left leg with ulceration of other part of foot: Secondary | ICD-10-CM | POA: Diagnosis not present

## 2016-11-11 ENCOUNTER — Ambulatory Visit (INDEPENDENT_AMBULATORY_CARE_PROVIDER_SITE_OTHER): Payer: Medicare HMO

## 2016-11-11 ENCOUNTER — Encounter: Payer: Self-pay | Admitting: Podiatry

## 2016-11-11 ENCOUNTER — Ambulatory Visit (INDEPENDENT_AMBULATORY_CARE_PROVIDER_SITE_OTHER): Payer: Medicare HMO | Admitting: Podiatry

## 2016-11-11 DIAGNOSIS — I70245 Atherosclerosis of native arteries of left leg with ulceration of other part of foot: Secondary | ICD-10-CM | POA: Diagnosis not present

## 2016-11-11 DIAGNOSIS — L97522 Non-pressure chronic ulcer of other part of left foot with fat layer exposed: Secondary | ICD-10-CM | POA: Diagnosis not present

## 2016-11-11 DIAGNOSIS — Z9889 Other specified postprocedural states: Secondary | ICD-10-CM | POA: Diagnosis not present

## 2016-11-11 DIAGNOSIS — E0842 Diabetes mellitus due to underlying condition with diabetic polyneuropathy: Secondary | ICD-10-CM | POA: Diagnosis not present

## 2016-11-11 MED ORDER — GENTAMICIN SULFATE 0.1 % EX CREA: 1.0000 "application " | TOPICAL_CREAM | Freq: Three times a day (TID) | CUTANEOUS | 1 refills | Status: DC

## 2016-11-11 NOTE — Progress Notes (Signed)
   Subjective:  Patient presents today status post toe amputation the third digit left foot along with total temporary nail avulsion left great toe. Date of surgery 10/17/2016. Patient states that the home health dressing changes of been coming to her home and changing dressings. Patient has no complaints today and believes she is improving significantly.   Objective/Physical Exam Skin incisions appear to be well coapted with sutures intact. No sign of infectious process noted. No dehiscence. No active bleeding noted. Moderate edema noted to the surgical extremity.  Wound #1 noted to the distal tuft of fourth digit left foot measuring approxima0.30.3x0.1 cm. Wound #2 noted to the distal tuft of the fifth digit left foot has healed. Complete reepithelialization has occurred.   To noted ulceration wound #1 there is no eschar. There is a moderate amount of slough fibronecrotic tissue with serous drainage noted. There is no exposed bone muscle-tendon ligament or joint. Granulation tissue and wound base is pink. Periwound integrity is intact.   Assessment: 1. s/p third digit amputation left foot with total temporary nail avulsion of great toe. 2. Ulcer fourth digit left foot secondary diabetes mellitus    Plan of Care:  1. Patient was evaluated.  2. Medically necessary excisional debridement including subcutaneous tissues performed using a tissue nipper. Excisional debridement of all the necrotic nonviable tissue down to healthy bleeding viable tissue was performed with post-debridement measurements and was pre- 3. Dry sterile dressings with antibiotic ointment were applied. 4. Sutures were removed today 5. Prescription for gentamicin cream placed. Recommend daily application of gentamicin cream and a Band-Aid 6. Patient can discontinue home health care 7. Return to clinic   Felecia ShellingBrent M. Everley Evora, DPM [facility]  Dr. Felecia ShellingBrent M. Krysta Bloomfield, DPM    [address]                                         [place], [place] [address]                Office [phone]) [phone]  Fax [phone](336) [phone]

## 2016-11-11 NOTE — Progress Notes (Signed)
DOS 10/17/16; Toe Amputation 3rd & 4th toe LT; Total Temporary Nail Avulsion of LT Great Toe.

## 2016-11-13 DIAGNOSIS — I70245 Atherosclerosis of native arteries of left leg with ulceration of other part of foot: Secondary | ICD-10-CM | POA: Diagnosis not present

## 2016-11-13 DIAGNOSIS — I10 Essential (primary) hypertension: Secondary | ICD-10-CM | POA: Diagnosis not present

## 2016-11-13 DIAGNOSIS — Z7902 Long term (current) use of antithrombotics/antiplatelets: Secondary | ICD-10-CM | POA: Diagnosis not present

## 2016-11-13 DIAGNOSIS — Z4781 Encounter for orthopedic aftercare following surgical amputation: Secondary | ICD-10-CM | POA: Diagnosis not present

## 2016-11-13 DIAGNOSIS — Z79891 Long term (current) use of opiate analgesic: Secondary | ICD-10-CM | POA: Diagnosis not present

## 2016-11-13 DIAGNOSIS — L97522 Non-pressure chronic ulcer of other part of left foot with fat layer exposed: Secondary | ICD-10-CM | POA: Diagnosis not present

## 2016-11-13 DIAGNOSIS — Z7982 Long term (current) use of aspirin: Secondary | ICD-10-CM | POA: Diagnosis not present

## 2016-11-13 DIAGNOSIS — Z9181 History of falling: Secondary | ICD-10-CM | POA: Diagnosis not present

## 2016-11-13 DIAGNOSIS — Z4800 Encounter for change or removal of nonsurgical wound dressing: Secondary | ICD-10-CM | POA: Diagnosis not present

## 2016-11-18 ENCOUNTER — Ambulatory Visit (HOSPITAL_COMMUNITY)
Admission: RE | Admit: 2016-11-18 | Payer: Medicare HMO | Source: Ambulatory Visit | Attending: Cardiovascular Disease | Admitting: Cardiovascular Disease

## 2016-11-18 ENCOUNTER — Encounter (HOSPITAL_COMMUNITY): Payer: Medicare HMO

## 2016-11-20 DIAGNOSIS — Z9181 History of falling: Secondary | ICD-10-CM | POA: Diagnosis not present

## 2016-11-20 DIAGNOSIS — Z7982 Long term (current) use of aspirin: Secondary | ICD-10-CM | POA: Diagnosis not present

## 2016-11-20 DIAGNOSIS — L97522 Non-pressure chronic ulcer of other part of left foot with fat layer exposed: Secondary | ICD-10-CM | POA: Diagnosis not present

## 2016-11-20 DIAGNOSIS — Z4781 Encounter for orthopedic aftercare following surgical amputation: Secondary | ICD-10-CM | POA: Diagnosis not present

## 2016-11-20 DIAGNOSIS — I70245 Atherosclerosis of native arteries of left leg with ulceration of other part of foot: Secondary | ICD-10-CM | POA: Diagnosis not present

## 2016-11-20 DIAGNOSIS — I10 Essential (primary) hypertension: Secondary | ICD-10-CM | POA: Diagnosis not present

## 2016-11-20 DIAGNOSIS — Z7902 Long term (current) use of antithrombotics/antiplatelets: Secondary | ICD-10-CM | POA: Diagnosis not present

## 2016-11-20 DIAGNOSIS — Z79891 Long term (current) use of opiate analgesic: Secondary | ICD-10-CM | POA: Diagnosis not present

## 2016-11-20 DIAGNOSIS — Z4800 Encounter for change or removal of nonsurgical wound dressing: Secondary | ICD-10-CM | POA: Diagnosis not present

## 2016-11-25 ENCOUNTER — Ambulatory Visit (INDEPENDENT_AMBULATORY_CARE_PROVIDER_SITE_OTHER): Payer: Medicare HMO | Admitting: Podiatry

## 2016-11-25 ENCOUNTER — Encounter: Payer: Self-pay | Admitting: Podiatry

## 2016-11-25 DIAGNOSIS — E0842 Diabetes mellitus due to underlying condition with diabetic polyneuropathy: Secondary | ICD-10-CM

## 2016-11-25 DIAGNOSIS — Z9889 Other specified postprocedural states: Secondary | ICD-10-CM

## 2016-11-25 DIAGNOSIS — I70245 Atherosclerosis of native arteries of left leg with ulceration of other part of foot: Secondary | ICD-10-CM

## 2016-11-25 NOTE — Progress Notes (Signed)
   HPI:  76 year old female presents today for follow-up evaluation regarding a third digit amputation left foot. Patient has a history of diabetes mellitus and osteomyelitis. Patient states that she's doing very well. She presents today wearing a postoperative shoe.    Physical Exam: General: The patient is alert and oriented x3 in no acute distress.  Dermatology: Skin is warm, dry and supple bilateral lower extremities. Negative for open lesions or macerations.  Vascular: Palpable pedal pulses bilaterally. No edema or erythema noted. Capillary refill within normal limits.  Neurological: Epicritic and protective threshold diminished bilaterally.   Musculoskeletal Exam: Range of motion within normal limits to all pedal and ankle joints bilateral. Muscle strength 5/5 in all groups bilateral.   Radiographic Exam:  Normal osseous mineralization. Joint spaces preserved. No fracture/dislocation/boney destruction.    Assessment: 1. Status post third digit amputation left foot 2. Ulcer fourth digit left foot secondary diabetes mellitus-healed   Plan of Care:  1. Patient was evaluated. 2. Today we're going to recommend an appointment with Betha, pedorthist, to be fitted for diabetic shoes with custom insoles 3. Return to clinic in 3 months for routine foot care   Felecia Shelling, DPM [facility]  Dr. Felecia Shelling, DPM    [address] [place], [place] [address]                Office [phone]  Fax [phone]

## 2016-12-04 ENCOUNTER — Ambulatory Visit: Payer: Medicare HMO | Admitting: Orthotics

## 2016-12-04 DIAGNOSIS — L97522 Non-pressure chronic ulcer of other part of left foot with fat layer exposed: Secondary | ICD-10-CM

## 2016-12-04 DIAGNOSIS — I999 Unspecified disorder of circulatory system: Secondary | ICD-10-CM

## 2016-12-04 NOTE — Progress Notes (Signed)
[facility].   Patient presents w/ hx of ulcers and amputations.   However she denies she is a diabetic and she insists she doesn't have a diabetic doctor.   We didn't cast her.  We did advise she could buy a pair for $145.00

## 2016-12-27 DIAGNOSIS — H524 Presbyopia: Secondary | ICD-10-CM | POA: Diagnosis not present

## 2017-01-03 DIAGNOSIS — Z1231 Encounter for screening mammogram for malignant neoplasm of breast: Secondary | ICD-10-CM | POA: Diagnosis not present

## 2017-01-03 LAB — HM MAMMOGRAPHY

## 2017-01-07 ENCOUNTER — Encounter: Payer: Self-pay | Admitting: Family Medicine

## 2017-01-17 ENCOUNTER — Ambulatory Visit (INDEPENDENT_AMBULATORY_CARE_PROVIDER_SITE_OTHER): Payer: Medicare HMO | Admitting: Family Medicine

## 2017-01-17 DIAGNOSIS — Z23 Encounter for immunization: Secondary | ICD-10-CM | POA: Diagnosis not present

## 2017-01-22 DIAGNOSIS — I739 Peripheral vascular disease, unspecified: Secondary | ICD-10-CM | POA: Diagnosis not present

## 2017-01-22 DIAGNOSIS — R600 Localized edema: Secondary | ICD-10-CM | POA: Diagnosis not present

## 2017-01-22 DIAGNOSIS — I10 Essential (primary) hypertension: Secondary | ICD-10-CM | POA: Diagnosis not present

## 2017-01-22 DIAGNOSIS — I998 Other disorder of circulatory system: Secondary | ICD-10-CM | POA: Diagnosis not present

## 2017-01-29 DIAGNOSIS — I10 Essential (primary) hypertension: Secondary | ICD-10-CM | POA: Insufficient documentation

## 2017-02-26 ENCOUNTER — Encounter: Payer: Self-pay | Admitting: Podiatry

## 2017-02-26 ENCOUNTER — Ambulatory Visit: Payer: Medicare HMO | Admitting: Podiatry

## 2017-02-26 DIAGNOSIS — B351 Tinea unguium: Secondary | ICD-10-CM | POA: Diagnosis not present

## 2017-02-26 DIAGNOSIS — M79609 Pain in unspecified limb: Secondary | ICD-10-CM

## 2017-03-02 NOTE — Progress Notes (Signed)
   SUBJECTIVE Patient presents to office today complaining of elongated, thickened nails. Pain while ambulating in shoes. Patient is unable to trim their own nails.   Past Medical History:  Diagnosis Date  . History of gastric ulcer    "after hysterectomy"  . Hypertension   . Ulcer     OBJECTIVE General Patient is awake, alert, and oriented x 3 and in no acute distress. Derm Skin is dry and supple bilateral. Negative open lesions or macerations. Remaining integument unremarkable. Nails are tender, long, thickened and dystrophic with subungual debris, consistent with onychomycosis, 1-5 bilateral. No signs of infection noted. Vasc  DP and PT pedal pulses palpable bilaterally. Temperature gradient within normal limits.  Neuro Epicritic and protective threshold sensation diminished bilaterally.  Musculoskeletal Exam No symptomatic pedal deformities noted bilateral. Muscular strength within normal limits.  ASSESSMENT 1. Onychodystrophic nails 1-5 bilateral with hyperkeratosis of nails.  2. Onychomycosis of nail due to dermatophyte bilateral 3. Pain in foot bilateral  PLAN OF CARE 1. Patient evaluated today.  2. Instructed to maintain good pedal hygiene and foot care.  3. Mechanical debridement of nails 1-5 bilaterally performed using a nail nipper. Filed with dremel without incident.  4. Return to clinic in 3 mos.    Felecia ShellingBrent M. Rosine Solecki, DPM [facility]  Dr. Felecia ShellingBrent M. Aric Jost, DPM    [address]                                        [place], [place] [address]                Office [phone]  Fax [phone]

## 2017-05-28 ENCOUNTER — Encounter: Payer: Self-pay | Admitting: Podiatry

## 2017-05-28 ENCOUNTER — Ambulatory Visit: Payer: Medicare HMO | Admitting: Podiatry

## 2017-05-28 DIAGNOSIS — M79609 Pain in unspecified limb: Secondary | ICD-10-CM | POA: Diagnosis not present

## 2017-05-28 DIAGNOSIS — B351 Tinea unguium: Secondary | ICD-10-CM | POA: Diagnosis not present

## 2017-05-28 DIAGNOSIS — Z9889 Other specified postprocedural states: Secondary | ICD-10-CM

## 2017-05-28 NOTE — Progress Notes (Signed)
Complaint:  Visit Type: Patient returns to my office for continued preventative foot care services. 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 The patient presents for preventative foot care services. No changes to ROS  Podiatric Exam: Vascular: dorsalis pedis  pulses are weakly  palpable bilateral. Capillary return is immediate. Cold feet noted.. Skin turgor WNL  Sensorium: Diminished Semmes Weinstein monofilament test. Normal tactile sensation bilaterally. Nail Exam: Pt has thick disfigured discolored nails with subungual debris noted bilateral entire nail hallux through fifth toenails except third toenail left foot. Ulcer Exam: There is no evidence of ulcer or pre-ulcerative changes or infection. Orthopedic Exam: Muscle tone and strength are WNL. No limitations in general ROM. No crepitus or effusions noted. Foot type and digits show no abnormalities. Bony prominences are unremarkable. Amputation third toe left foot. Skin: No Porokeratosis. No infection or ulcers  Diagnosis:  Onychomycosis, , Pain in right toe, pain in left toes  Treatment & Plan Procedures and Treatment: Consent by patient was obtained for treatment procedures.   Debridement of mycotic and hypertrophic toenails, 1 through 5 bilateral and clearing of subungual debris. No ulceration, no infection noted.  Return Visit-Office Procedure: Patient instructed to return to the office for a follow up visit 3 months for continued evaluation and treatment.    Kaity Pitstick DPM 

## 2017-08-13 ENCOUNTER — Other Ambulatory Visit: Payer: Self-pay | Admitting: Family Medicine

## 2017-08-27 ENCOUNTER — Ambulatory Visit: Payer: Medicare HMO | Admitting: Podiatry

## 2017-08-27 ENCOUNTER — Encounter: Payer: Self-pay | Admitting: Podiatry

## 2017-08-27 DIAGNOSIS — E0842 Diabetes mellitus due to underlying condition with diabetic polyneuropathy: Secondary | ICD-10-CM

## 2017-08-27 DIAGNOSIS — B351 Tinea unguium: Secondary | ICD-10-CM | POA: Diagnosis not present

## 2017-08-27 DIAGNOSIS — M79609 Pain in unspecified limb: Secondary | ICD-10-CM

## 2017-08-27 NOTE — Progress Notes (Signed)
Complaint:  Visit Type: Patient returns to my office for continued preventative foot care services. 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 The patient presents for preventative foot care services. No changes to ROS  Podiatric Exam: Vascular: dorsalis pedis  pulses are weakly  palpable bilateral. Capillary return is immediate. Cold feet noted.. Skin turgor WNL  Sensorium: Diminished Semmes Weinstein monofilament test. Normal tactile sensation bilaterally. Nail Exam: Pt has thick disfigured discolored nails with subungual debris noted bilateral entire nail hallux through fifth toenails except third toenail left foot. Ulcer Exam: There is no evidence of ulcer or pre-ulcerative changes or infection. Orthopedic Exam: Muscle tone and strength are WNL. No limitations in general ROM. No crepitus or effusions noted. Foot type and digits show no abnormalities. Bony prominences are unremarkable. Amputation third toe left foot. Skin: No Porokeratosis. No infection or ulcers  Diagnosis:  Onychomycosis, , Pain in right toe, pain in left toes  Treatment & Plan Procedures and Treatment: Consent by patient was obtained for treatment procedures.   Debridement of mycotic and hypertrophic toenails, 1 through 5 bilateral and clearing of subungual debris. No ulceration, no infection noted.  Return Visit-Office Procedure: Patient instructed to return to the office for a follow up visit 3 months for continued evaluation and treatment.    Shemaiah Round DPM 

## 2017-10-15 ENCOUNTER — Other Ambulatory Visit: Payer: Self-pay | Admitting: Family Medicine

## 2017-11-11 ENCOUNTER — Encounter: Payer: Self-pay | Admitting: Family Medicine

## 2017-11-11 ENCOUNTER — Ambulatory Visit (INDEPENDENT_AMBULATORY_CARE_PROVIDER_SITE_OTHER): Payer: Medicare HMO | Admitting: Family Medicine

## 2017-11-11 VITALS — BP 130/76 | HR 84 | Temp 98.3°F | Resp 12 | Ht <= 58 in | Wt 146.0 lb

## 2017-11-11 DIAGNOSIS — Z89422 Acquired absence of other left toe(s): Secondary | ICD-10-CM | POA: Diagnosis not present

## 2017-11-11 DIAGNOSIS — Z0000 Encounter for general adult medical examination without abnormal findings: Secondary | ICD-10-CM

## 2017-11-11 DIAGNOSIS — Z780 Asymptomatic menopausal state: Secondary | ICD-10-CM | POA: Diagnosis not present

## 2017-11-11 DIAGNOSIS — I739 Peripheral vascular disease, unspecified: Secondary | ICD-10-CM | POA: Diagnosis not present

## 2017-11-11 DIAGNOSIS — Z13220 Encounter for screening for lipoid disorders: Secondary | ICD-10-CM | POA: Diagnosis not present

## 2017-11-11 DIAGNOSIS — R6889 Other general symptoms and signs: Secondary | ICD-10-CM | POA: Diagnosis not present

## 2017-11-11 DIAGNOSIS — E785 Hyperlipidemia, unspecified: Secondary | ICD-10-CM | POA: Diagnosis not present

## 2017-11-11 DIAGNOSIS — I10 Essential (primary) hypertension: Secondary | ICD-10-CM

## 2017-11-11 DIAGNOSIS — E538 Deficiency of other specified B group vitamins: Secondary | ICD-10-CM | POA: Diagnosis not present

## 2017-11-11 DIAGNOSIS — D539 Nutritional anemia, unspecified: Secondary | ICD-10-CM | POA: Diagnosis not present

## 2017-11-11 DIAGNOSIS — D649 Anemia, unspecified: Secondary | ICD-10-CM | POA: Diagnosis not present

## 2017-11-11 NOTE — Progress Notes (Signed)
Subjective:    Patient ID: Ann Powell, female    DOB: [birth year], 77 y.o.   MRN: [id number]  HPI Patient is scheduled today for complete physical exam.  Last year, the patient underwent amputation of her left third toe distal to the MTP joint under the care of Dr. Logan Bores due to chronic osteomyelitis and nonhealing ulcer.  She also underwent toenail avulsion of the left hallux.  She is following up with podiatry every 3 months.  She is currently on aspirin and Plavix due to peripheral vascular disease under the care of Dr. Allyson Sabal.  She is not taking a statin.  Her blood pressure today is well controlled at 130/76.  She is on losartan.  She has no major concerns.  Her mammogram was performed in October and was normal.  She is due for repeat mammogram in October.  She has never had a colonoscopy.  She declines a colonoscopy but she would consent to Cologuard.  Due to her age, she does not require a Pap smear and also she has had a hysterectomy.  Her most recent immunizations are are listed below.  She is due for a tetanus shot as well as a flu shot: Immunization History  Administered Date(s) Administered  . Influenza, High Dose Seasonal PF 01/17/2017  . Influenza,inj,Quad PF,6+ Mos 12/28/2013, 02/13/2015, 01/26/2016  . Pneumococcal Conjugate-13 10/17/2014  . Pneumococcal Polysaccharide-23 10/25/2013  . Zoster 10/19/2014   Patient has never had a bone density test.  She is not taking calcium or vitamin D. Past Medical History:  Diagnosis Date  . History of gastric ulcer    "after hysterectomy"  . Hypertension   . PVD (peripheral vascular disease) (HCC)   . Ulcer    Past Surgical History:  Procedure Laterality Date  . ABDOMINAL HYSTERECTOMY    . LOWER EXTREMITY INTERVENTION N/A 09/02/2016   Procedure: Lower Extremity Intervention;  Surgeon: Runell Gess, MD;  Location[facility] INVASIVE CV LAB;  Service: Cardiovascular;  Laterality: N/A;  . TOE AMPUTATION     left 3rd distal to MTP (Dr.  Logan Bores) due to chronic osteomyelitis and ischemia.   . TONSILLECTOMY     Current Outpatient Medications on File Prior to Visit  Medication Sig Dispense Refill  . acetaminophen (TYLENOL) 500 MG tablet Take 1,000 mg by mouth.    Marland Kitchen aspirin (GOODSENSE ASPIRIN) 325 MG tablet Take 325 mg by mouth.    . carbonyl iron (FEOSOL) 45 MG TABS tablet Take 45 mg by mouth.    . cholecalciferol (VITAMIN D) 1000 units tablet Take 1,000 Units by mouth daily.    . clopidogrel (PLAVIX) 75 MG tablet TAKE 1 TABLET EVERY DAY 90 tablet 3  . co-enzyme Q-10 30 MG capsule Take 30 mg by mouth daily.     Marland Kitchen losartan (COZAAR) 50 MG tablet TAKE 1 TABLET EVERY DAY 90 tablet 3  . magnesium gluconate (MAGONATE) 500 MG tablet Take 500 mg by mouth daily.    . vitamin B-12 (CYANOCOBALAMIN) 1000 MCG tablet Take 1,000 mcg by mouth daily.     No current facility-administered medications on file prior to visit.    No Known Allergies Social History   Socioeconomic History  . Marital status: Single    Spouse name: Not on file  . Number of children: Not on file  . Years of education: Not on file  . Highest education level: Not on file  Occupational History  . Not on file  Social Needs  . Financial resource strain:  Not on file  . Food insecurity:    Worry: Not on file    Inability: Not on file  . Transportation needs:    Medical: Not on file    Non-medical: Not on file  Tobacco Use  . Smoking status: Never Smoker  . Smokeless tobacco: Never Used  Substance and Sexual Activity  . Alcohol use: No  . Drug use: No  . Sexual activity: Never  Lifestyle  . Physical activity:    Days per week: Not on file    Minutes per session: Not on file  . Stress: Not on file  Relationships  . Social connections:    Talks on phone: Not on file    Gets together: Not on file    Attends religious service: Not on file    Active member of club or organization: Not on file    Attends meetings of clubs or organizations: Not on file     Relationship status: Not on file  . Intimate partner violence:    Fear of current or ex partner: Not on file    Emotionally abused: Not on file    Physically abused: Not on file    Forced sexual activity: Not on file  Other Topics Concern  . Not on file  Social History Narrative  . Not on file   No family history on file.    Review of Systems  All other systems reviewed and are negative.      Objective:   Physical Exam  Constitutional: She is oriented to person, place, and time. She appears well-developed and well-nourished. No distress.  HENT:  Head: Normocephalic and atraumatic.  Right Ear: External ear normal.  Left Ear: External ear normal.  Nose: Nose normal.  Mouth/Throat: Oropharynx is clear and moist. No oropharyngeal exudate.  Eyes: Pupils are equal, round, and reactive to light. Conjunctivae and EOM are normal. Right eye exhibits no discharge. Left eye exhibits no discharge. No scleral icterus.  Neck: Trachea normal. Muscular tenderness present. Carotid bruit is not present. Neck rigidity present. No thyroid mass and no thyromegaly present.    Cardiovascular: Normal rate, regular rhythm, normal heart sounds and intact distal pulses. Exam reveals no gallop and no friction rub.  No murmur heard. Pulmonary/Chest: Effort normal and breath sounds normal. No stridor. No respiratory distress. She has no wheezes. She has no rales. She exhibits no tenderness.  Abdominal: Soft. Bowel sounds are normal. She exhibits no distension and no mass. There is no tenderness. There is no rebound and no guarding. No hernia.  Musculoskeletal: Normal range of motion. She exhibits no edema, tenderness or deformity.  Neurological: She is alert and oriented to person, place, and time. She displays normal reflexes. No cranial nerve deficit or sensory deficit. She exhibits normal muscle tone. Coordination normal.  Skin: Skin is warm. No rash noted. She is not diaphoretic. No erythema. No pallor.    Vitals reviewed.         Assessment & Plan:  PVD (peripheral vascular disease) (HCC) - Plan: CBC with Differential/Platelet, COMPLETE METABOLIC PANEL WITH GFR, Lipid panel  Essential hypertension  General medical exam  History of amputation of lesser toe of left foot (HCC)  Postmenopausal estrogen deficiency - Plan: DG Bone Density  Schedule patient for DEXA. Mammogram is UTD.  Schedule patient for cologuard.  Recommended 1200 mg poqday of calcium and 100 units of vit d.  Check cbc, cmp, flp.  Goal ldl is less than 70.  Will likely suggest  moderate intensity statin.  Recommended annual flu shot.  She declines tdap.

## 2017-11-14 LAB — COMPLETE METABOLIC PANEL WITH GFR
AG Ratio: 1.8 (calc) (ref 1.0–2.5)
ALKALINE PHOSPHATASE (APISO): 65 U/L (ref 33–130)
ALT: 10 U/L (ref 6–29)
AST: 19 U/L (ref 10–35)
Albumin: 4.2 g/dL (ref 3.6–5.1)
BUN: 14 mg/dL (ref 7–25)
CALCIUM: 9.8 mg/dL (ref 8.6–10.4)
CHLORIDE: 103 mmol/L (ref 98–110)
CO2: 25 mmol/L (ref 20–32)
CREATININE: 0.83 mg/dL (ref 0.60–0.93)
GFR, EST AFRICAN AMERICAN: 79 mL/min/{1.73_m2} (ref >=60)
GFR, Est Non African American: 68 mL/min/{1.73_m2} (ref >=60)
GLOBULIN: 2.4 g/dL (ref 1.9–3.7)
Glucose, Bld: 76 mg/dL (ref 65–99)
Potassium: 5.2 mmol/L (ref 3.5–5.3)
Sodium: 140 mmol/L (ref 135–146)
Total Bilirubin: 1.3 mg/dL — ABNORMAL HIGH (ref 0.2–1.2)
Total Protein: 6.6 g/dL (ref 6.1–8.1)

## 2017-11-14 LAB — LIPID PANEL
CHOL/HDL RATIO: 3.1 (calc) (ref <5.0)
CHOLESTEROL: 170 mg/dL (ref <200)
HDL: 54 mg/dL (ref >50)
LDL Cholesterol (Calc): 100 mg/dL (calc) — ABNORMAL HIGH
Non-HDL Cholesterol (Calc): 116 mg/dL (calc) (ref <130)
TRIGLYCERIDES: 71 mg/dL (ref <150)

## 2017-11-14 LAB — CBC WITH DIFFERENTIAL/PLATELET
BASOS ABS: 52 {cells}/uL (ref 0–200)
Basophils Relative: 1 %
Eosinophils Absolute: 42 cells/uL (ref 15–500)
Eosinophils Relative: 0.8 %
HEMATOCRIT: 32.9 % — AB (ref 35.0–45.0)
Hemoglobin: 10.8 g/dL — ABNORMAL LOW (ref 11.7–15.5)
LYMPHS ABS: 1035 {cells}/uL (ref 850–3900)
MCH: 29.8 pg (ref 27.0–33.0)
MCHC: 32.8 g/dL (ref 32.0–36.0)
MCV: 90.9 fL (ref 80.0–100.0)
MPV: 10 fL (ref 7.5–12.5)
Monocytes Relative: 9 %
NEUTROS PCT: 69.3 %
Neutro Abs: 3604 cells/uL (ref 1500–7800)
Platelets: 412 10*3/uL — ABNORMAL HIGH (ref 140–400)
RBC: 3.62 10*6/uL — ABNORMAL LOW (ref 3.80–5.10)
RDW: 16 % — AB (ref 11.0–15.0)
Total Lymphocyte: 19.9 %
WBC: 5.2 10*3/uL (ref 3.8–10.8)
WBCMIX: 468 {cells}/uL (ref 200–950)

## 2017-11-14 LAB — TEST AUTHORIZATION

## 2017-11-14 LAB — FERRITIN: FERRITIN: 336 ng/mL — AB (ref 16–288)

## 2017-11-14 LAB — VITAMIN B12

## 2017-11-14 LAB — SPECIMEN COMPROMISED

## 2017-11-21 ENCOUNTER — Other Ambulatory Visit: Payer: Medicare HMO

## 2017-11-21 DIAGNOSIS — D649 Anemia, unspecified: Secondary | ICD-10-CM | POA: Diagnosis not present

## 2017-11-22 LAB — FECAL GLOBIN BY IMMUNOCHEMISTRY
FECAL GLOBIN RESULT: NOT DETECTED
MICRO NUMBER:: 91009443
SPECIMEN QUALITY: ADEQUATE

## 2017-11-26 ENCOUNTER — Encounter: Payer: Self-pay | Admitting: Podiatry

## 2017-11-26 ENCOUNTER — Ambulatory Visit: Payer: Medicare HMO | Admitting: Podiatry

## 2017-11-26 DIAGNOSIS — S98132S Complete traumatic amputation of one left lesser toe, sequela: Secondary | ICD-10-CM

## 2017-11-26 DIAGNOSIS — E0842 Diabetes mellitus due to underlying condition with diabetic polyneuropathy: Secondary | ICD-10-CM

## 2017-11-26 DIAGNOSIS — B351 Tinea unguium: Secondary | ICD-10-CM

## 2017-11-26 DIAGNOSIS — E1159 Type 2 diabetes mellitus with other circulatory complications: Secondary | ICD-10-CM

## 2017-11-26 DIAGNOSIS — M79609 Pain in unspecified limb: Secondary | ICD-10-CM

## 2017-11-26 NOTE — Progress Notes (Signed)
Complaint:  Visit Type: Patient returns to my office for continued preventative foot care services. 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 The patient presents for preventative foot care services. No changes to ROS  Podiatric Exam: Vascular: dorsalis pedis  pulses are weakly  palpable bilateral. Capillary return is immediate. Cold feet noted.. Skin turgor WNL  Sensorium: Diminished Semmes Weinstein monofilament test. Normal tactile sensation bilaterally. Nail Exam: Pt has thick disfigured discolored nails with subungual debris noted bilateral entire nail hallux through fifth toenails except third toenail left foot. Ulcer Exam: There is no evidence of ulcer or pre-ulcerative changes or infection. Orthopedic Exam: Muscle tone and strength are WNL. No limitations in general ROM. No crepitus or effusions noted. Foot type and digits show no abnormalities. Bony prominences are unremarkable. Amputation third toe left foot. Skin: No Porokeratosis. No infection or ulcers  Diagnosis:  Onychomycosis, , Pain in right toe, pain in left toes  Treatment & Plan Procedures and Treatment: Consent by patient was obtained for treatment procedures.   Debridement of mycotic and hypertrophic toenails, 1 through 5 bilateral and clearing of subungual debris. No ulceration, no infection noted.  Return Visit-Office Procedure: Patient instructed to return to the office for a follow up visit 3 months for continued evaluation and treatment.    Kaylean Tupou DPM 

## 2017-12-12 ENCOUNTER — Ambulatory Visit (INDEPENDENT_AMBULATORY_CARE_PROVIDER_SITE_OTHER): Payer: Medicare HMO | Admitting: Family Medicine

## 2017-12-12 ENCOUNTER — Other Ambulatory Visit: Payer: Self-pay | Admitting: Family Medicine

## 2017-12-12 ENCOUNTER — Other Ambulatory Visit: Payer: Medicare HMO

## 2017-12-12 DIAGNOSIS — D508 Other iron deficiency anemias: Secondary | ICD-10-CM

## 2017-12-12 DIAGNOSIS — Z23 Encounter for immunization: Secondary | ICD-10-CM | POA: Diagnosis not present

## 2017-12-12 NOTE — Progress Notes (Signed)
[id number]

## 2017-12-15 LAB — PATHOLOGIST SMEAR REVIEW

## 2017-12-31 ENCOUNTER — Other Ambulatory Visit: Payer: Self-pay | Admitting: Family Medicine

## 2017-12-31 DIAGNOSIS — D508 Other iron deficiency anemias: Secondary | ICD-10-CM

## 2018-01-08 DIAGNOSIS — H524 Presbyopia: Secondary | ICD-10-CM | POA: Diagnosis not present

## 2018-01-08 DIAGNOSIS — Z1231 Encounter for screening mammogram for malignant neoplasm of breast: Secondary | ICD-10-CM | POA: Diagnosis not present

## 2018-01-08 LAB — HM MAMMOGRAPHY

## 2018-01-09 ENCOUNTER — Ambulatory Visit
Admission: RE | Admit: 2018-01-09 | Discharge: 2018-01-09 | Disposition: A | Discharge status: Home / Self Care | Payer: Medicare HMO | Source: Ambulatory Visit | Attending: Family Medicine | Admitting: Family Medicine

## 2018-01-09 DIAGNOSIS — Z780 Asymptomatic menopausal state: Secondary | ICD-10-CM | POA: Diagnosis not present

## 2018-01-09 DIAGNOSIS — Z13820 Encounter for screening for osteoporosis: Secondary | ICD-10-CM | POA: Diagnosis not present

## 2018-01-13 ENCOUNTER — Encounter: Payer: Self-pay | Admitting: *Deleted

## 2018-01-14 ENCOUNTER — Encounter: Payer: Self-pay | Admitting: *Deleted

## 2018-02-25 ENCOUNTER — Ambulatory Visit: Payer: Medicare HMO | Admitting: Podiatry

## 2018-02-25 ENCOUNTER — Encounter: Payer: Self-pay | Admitting: Podiatry

## 2018-02-25 DIAGNOSIS — M79609 Pain in unspecified limb: Principal | ICD-10-CM

## 2018-02-25 DIAGNOSIS — S98132S Complete traumatic amputation of one left lesser toe, sequela: Secondary | ICD-10-CM

## 2018-02-25 DIAGNOSIS — M79676 Pain in unspecified toe(s): Secondary | ICD-10-CM

## 2018-02-25 DIAGNOSIS — B351 Tinea unguium: Secondary | ICD-10-CM

## 2018-02-25 DIAGNOSIS — E0842 Diabetes mellitus due to underlying condition with diabetic polyneuropathy: Secondary | ICD-10-CM | POA: Diagnosis not present

## 2018-02-25 NOTE — Progress Notes (Signed)
Complaint:  Visit Type: Patient returns to my office for continued preventative foot care services. 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 The patient presents for preventative foot care services. No changes to ROS  Podiatric Exam: Vascular: dorsalis pedis  pulses are weakly  palpable bilateral. Capillary return is immediate. Cold feet noted.. Skin turgor WNL  Sensorium: Diminished Semmes Weinstein monofilament test. Normal tactile sensation bilaterally. Nail Exam: Pt has thick disfigured discolored nails with subungual debris noted bilateral entire nail hallux through fifth toenails except third toenail left foot. Ulcer Exam: There is no evidence of ulcer or pre-ulcerative changes or infection. Orthopedic Exam: Muscle tone and strength are WNL. No limitations in general ROM. No crepitus or effusions noted. Foot type and digits show no abnormalities. Bony prominences are unremarkable. Amputation third toe left foot. Skin: No Porokeratosis. No infection or ulcers  Diagnosis:  Onychomycosis, , Pain in right toe, pain in left toes  Treatment & Plan Procedures and Treatment: Consent by patient was obtained for treatment procedures.   Debridement of mycotic and hypertrophic toenails, 1 through 5 bilateral and clearing of subungual debris. No ulceration, no infection noted.  Return Visit-Office Procedure: Patient instructed to return to the office for a follow up visit 3 months for continued evaluation and treatment.    Helane GuntherGregory Kamyra Schroeck DPM

## 2018-03-21 ENCOUNTER — Encounter (HOSPITAL_COMMUNITY): Payer: Self-pay

## 2018-03-21 ENCOUNTER — Ambulatory Visit (HOSPITAL_COMMUNITY)
Admission: EM | Admit: 2018-03-21 | Discharge: 2018-03-21 | Disposition: A | Discharge status: Home / Self Care | Payer: Medicare HMO | Attending: Internal Medicine | Admitting: Internal Medicine

## 2018-03-21 DIAGNOSIS — S01512A Laceration without foreign body of oral cavity, initial encounter: Secondary | ICD-10-CM | POA: Diagnosis not present

## 2018-03-21 MED ORDER — LIDOCAINE-EPINEPHRINE (PF) 2 %-1:200000 IJ SOLN
INTRAMUSCULAR | Status: AC
  Filled 2018-03-21: qty 20

## 2018-03-21 NOTE — ED Triage Notes (Signed)
Pt presents with hole in tongue from biting it.

## 2018-03-21 NOTE — Discharge Instructions (Addendum)
See print out

## 2018-03-21 NOTE — ED Provider Notes (Signed)
[facility]    CSN: [id number] Arrival date & time: 03/21/18  1215  History   Chief Complaint Chief Complaint  Patient presents with  . Bit Hole in Tongue   HPI Ann Powell is a 77 y.o. female.   Who presents due to her tongue bleeding from biting it accidentally this am when she was eating pop corn. She has not been able to get it to stop. She is on plavix.     Past Medical History:  Diagnosis Date  . History of gastric ulcer    "after hysterectomy"  . Hypertension   . PVD (peripheral vascular disease) (HCC)   . Ulcer     Patient Active Problem List   Diagnosis Date Noted  . PVD (peripheral vascular disease) (HCC)   . Edema of right lower extremity 09/25/2016  . Gangrenous (HCC) 09/25/2016  . Pain in extremity 09/25/2016  . Critical lower limb ischemia 08/22/2016  . HTN (hypertension) 10/17/2015    Past Surgical History:  Procedure Laterality Date  . ABDOMINAL HYSTERECTOMY    . LOWER EXTREMITY INTERVENTION N/A 09/02/2016   Procedure: Lower Extremity Intervention;  Surgeon: Runell GessBerry, Jonathan J, MD;  [facility];  Service: Cardiovascular;  Laterality: N/A;  . TOE AMPUTATION     left 3rd distal to MTP (Dr. Logan BoresEvans) due to chronic osteomyelitis and ischemia.   . TONSILLECTOMY      OB History   No obstetric history on file.      Home Medications    Prior to Admission medications   Medication Sig Start Date End Date Taking? Authorizing Provider  acetaminophen (TYLENOL) 500 MG tablet Take 1,000 mg by mouth.    [provider]  aspirin (GOODSENSE ASPIRIN) 325 MG tablet Take 325 mg by mouth. 10/01/16   [provider]  carbonyl iron (FEOSOL) 45 MG TABS tablet Take 45 mg by mouth.    [provider]  cholecalciferol (VITAMIN D) 1000 units tablet Take 1,000 Units by mouth daily.    [provider]  clopidogrel (PLAVIX) 75 MG tablet TAKE 1 TABLET EVERY DAY 08/13/17   Donita BrooksPickard, Warren T, MD  co-enzyme  Q-10 30 MG capsule Take 30 mg by mouth daily.     [provider]  losartan (COZAAR) 50 MG tablet TAKE 1 TABLET EVERY DAY 10/15/17   Donita BrooksPickard, Warren T, MD  magnesium gluconate (MAGONATE) 500 MG tablet Take 500 mg by mouth daily.    [provider]  vitamin B-12 (CYANOCOBALAMIN) 1000 MCG tablet Take 1,000 mcg by mouth daily.    [provider]    Family History History reviewed. No pertinent family history.  Social History Social History   Tobacco Use  . Smoking status: Never Smoker  . Smokeless tobacco: Never Used  Substance Use Topics  . Alcohol use: No  . Drug use: No     Allergies   Patient has no known allergies.   Review of Systems Review of Systems  Constitutional: Negative for appetite change and fatigue.  HENT: Negative for congestion and sore throat.        Bleeding tongue  Respiratory: Negative for shortness of breath.   Cardiovascular: Negative for chest pain.  Musculoskeletal: Negative for gait problem.  Skin: Positive for wound. Negative for rash.  Neurological: Negative for weakness and headaches.  Hematological: Bruises/bleeds easily.     Physical Exam Triage Vital Signs ED Triage Vitals  Enc Vitals Group     BP 03/21/18 1346 (!) 175/91  Pulse Rate 03/21/18 1346 (!) 107     Resp 03/21/18 1346 18     Temp 03/21/18 1346 98.5 F (36.9 C)     Temp Source 03/21/18 1346 Oral     SpO2 03/21/18 1346 99 %     Weight --      Height --      Head Circumference --      Peak Flow --      Pain Score 03/21/18 1347 4     Pain Loc --      Pain Edu? --      Excl. in GC? --    No data found.  Updated Vital Signs BP (!) 175/91 (BP Location: Left Arm)   Pulse (!) 107   Temp 98.5 F (36.9 C) (Oral)   Resp 18   SpO2 99%   Visual Acuity Right Eye Distance:   Left Eye Distance:   Bilateral Distance:    Right Eye Near:   Left Eye Near:    Bilateral Near:     Physical Exam Vitals signs and nursing note reviewed.    Constitutional:      General: She is not in acute distress.    Appearance: Normal appearance.     Comments: Who was holding gauze in her mouth  HENT:     Head: Normocephalic.     Comments: R mid tongue with a puncture laceration of 1/2 cm. I tried to cauterize it, but it did not respond. I had her hold pressure for 20 minutes and helped decrease the bleeding by 50 %, but did not stop it.     Nose: Nose normal.     Mouth/Throat:     Mouth: Mucous membranes are moist.  Eyes:     General: No scleral icterus.    Conjunctiva/sclera: Conjunctivae normal.  Neck:     Musculoskeletal: Neck supple.  Pulmonary:     Effort: Pulmonary effort is normal.  Neurological:     Mental Status: She is alert.  Psychiatric:        Mood and Affect: Mood normal.        Behavior: Behavior normal.        Thought Content: Thought content normal.        Judgment: Judgment normal.      UC Treatments / Results  Labs (all labs ordered are listed, but only abnormal results are displayed) Labs Reviewed - No data to display  EKG None  Radiology No results found.  Procedures Laceration Repair Date/Time: 03/21/2018 7:01 PM Performed by: Garey Ham, PA-C Authorized by: Garey Ham, PA-C   Consent:    Consent obtained:  Verbal   Consent given by:  Patient   Risks discussed:  Pain Anesthesia (see MAR for exact dosages):    Anesthesia method:  Local infiltration   Local anesthetic:  Lidocaine 1% w/o epi (2 ml) Laceration details:    Location:  Mouth   Mouth location:  Tongue, anterior 2/3   Length (cm):  0.5 Repair type:    Repair type:  Simple Exploration:    Hemostasis achieved with:  Direct pressure (with suturing) Treatment:    Visualized foreign bodies/material removed: no   Skin repair:    Repair method:  Sutures   Suture size:  4-0   Suture material:  Plain gut   Number of sutures:  2 Approximation:    Approximation:  Close Post-procedure  details:    Patient tolerance of procedure:  Tolerated well, no immediate complications  Medications Ordered in UC Medications - No data to display  Initial Impression / Assessment and Plan / UC Course  I have reviewed the triage vital signs and the nursing notes. R mid tongue laceration. Attempted to stop bleeding as noted in exam, but was achieved by 2 sutures. Pt was advised to only have bland food, and Fu with PCP or dentist next week. I reviewed signs of infection, and what to watch out for.  I forgot to recheck her BP before she left so I called her at home to check how she was doing ,and she was fine, with no more bleeding. I asked her to check her BP and she checked it 3 times, the last one was 140/74.  Final Clinical Impressions(s) / UC Diagnoses   Final diagnoses:  Laceration of tongue, initial encounter     Discharge Instructions     See print out    ED Prescriptions    None     Controlled Substance Prescriptions [place] Controlled Substance Registry consulted?    Garey HamRodriguez-Southworth, Harrietta Incorvaia, Cordelia Poche-C 03/21/18 1923

## 2018-05-27 ENCOUNTER — Encounter: Payer: Self-pay | Admitting: Podiatry

## 2018-05-27 ENCOUNTER — Ambulatory Visit: Payer: Medicare HMO | Admitting: Podiatry

## 2018-05-27 DIAGNOSIS — E0842 Diabetes mellitus due to underlying condition with diabetic polyneuropathy: Secondary | ICD-10-CM

## 2018-05-27 DIAGNOSIS — M79609 Pain in unspecified limb: Secondary | ICD-10-CM

## 2018-05-27 DIAGNOSIS — B351 Tinea unguium: Secondary | ICD-10-CM

## 2018-05-27 DIAGNOSIS — S98132S Complete traumatic amputation of one left lesser toe, sequela: Secondary | ICD-10-CM

## 2018-05-27 NOTE — Progress Notes (Signed)
Complaint:  Visit Type: Patient returns to my office for continued preventative foot care services. 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 The patient presents for preventative foot care services. No changes to ROS.  Patient is taking plavix.  Podiatric Exam: Vascular: dorsalis pedis  pulses are weakly  palpable bilateral. Capillary return is immediate. Cold feet noted.. Skin turgor WNL  Sensorium: Diminished Semmes Weinstein monofilament test. Normal tactile sensation bilaterally. Nail Exam: Pt has thick disfigured discolored nails with subungual debris noted bilateral entire nail hallux through fifth toenails except third toenail left foot. Ulcer Exam: There is no evidence of ulcer or pre-ulcerative changes or infection. Orthopedic Exam: Muscle tone and strength are WNL. No limitations in general ROM. No crepitus or effusions noted. Foot type and digits show no abnormalities. Bony prominences are unremarkable. Amputation third toe left foot. Skin: No Porokeratosis. No infection or ulcers  Diagnosis:  Onychomycosis, , Pain in right toe, pain in left toes  Treatment & Plan Procedures and Treatment: Consent by patient was obtained for treatment procedures.   Debridement of mycotic and hypertrophic toenails, 1 through 5 bilateral and clearing of subungual debris. No ulceration, no infection noted.  Return Visit-Office Procedure: Patient instructed to return to the office for a follow up visit 6 months for continued evaluation and treatment.    Beaulah Romanek DPM 

## 2018-09-11 ENCOUNTER — Other Ambulatory Visit: Payer: Self-pay | Admitting: Family Medicine

## 2018-09-30 ENCOUNTER — Other Ambulatory Visit: Payer: Self-pay | Admitting: Family Medicine

## 2018-10-19 ENCOUNTER — Other Ambulatory Visit: Payer: Self-pay | Admitting: Family Medicine

## 2018-10-19 MED ORDER — LOSARTAN POTASSIUM 50 MG PO TABS: 50.0000 mg | ORAL_TABLET | Freq: Every day | ORAL | 3 refills | Status: DC

## 2018-10-19 MED ORDER — CLOPIDOGREL BISULFATE 75 MG PO TABS: 75.0000 mg | ORAL_TABLET | Freq: Every day | ORAL | 3 refills | Status: DC

## 2018-11-25 ENCOUNTER — Encounter: Payer: Self-pay | Admitting: Podiatry

## 2018-11-25 ENCOUNTER — Other Ambulatory Visit: Payer: Self-pay

## 2018-11-25 ENCOUNTER — Ambulatory Visit: Payer: Medicare HMO | Admitting: Podiatry

## 2018-11-25 DIAGNOSIS — B351 Tinea unguium: Secondary | ICD-10-CM

## 2018-11-25 DIAGNOSIS — E0842 Diabetes mellitus due to underlying condition with diabetic polyneuropathy: Secondary | ICD-10-CM

## 2018-11-25 DIAGNOSIS — M79609 Pain in unspecified limb: Secondary | ICD-10-CM | POA: Diagnosis not present

## 2018-11-25 DIAGNOSIS — S98132S Complete traumatic amputation of one left lesser toe, sequela: Secondary | ICD-10-CM

## 2018-11-25 NOTE — Progress Notes (Signed)
Complaint:  Visit Type: Patient returns to my office for continued preventative foot care services. 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 The patient presents for preventative foot care services. No changes to ROS.  Patient is taking plavix.  Podiatric Exam: Vascular: dorsalis pedis  pulses are weakly  palpable bilateral. Capillary return is immediate. Cold feet noted.. Skin turgor WNL  Sensorium: Diminished Semmes Weinstein monofilament test. Normal tactile sensation bilaterally. Nail Exam: Pt has thick disfigured discolored nails with subungual debris noted bilateral entire nail hallux through fifth toenails except third toenail left foot. Ulcer Exam: There is no evidence of ulcer or pre-ulcerative changes or infection. Orthopedic Exam: Muscle tone and strength are WNL. No limitations in general ROM. No crepitus or effusions noted. Foot type and digits show no abnormalities. Bony prominences are unremarkable. Amputation third toe left foot. Skin: No Porokeratosis. No infection or ulcers  Diagnosis:  Onychomycosis, , Pain in right toe, pain in left toes  Treatment & Plan Procedures and Treatment: Consent by patient was obtained for treatment procedures.   Debridement of mycotic and hypertrophic toenails, 1 through 5 bilateral and clearing of subungual debris. No ulceration, no infection noted.  Return Visit-Office Procedure: Patient instructed to return to the office for a follow up visit 6 months for continued evaluation and treatment.    Gardiner Barefoot DPM

## 2019-01-11 DIAGNOSIS — H5212 Myopia, left eye: Secondary | ICD-10-CM | POA: Diagnosis not present

## 2019-01-14 ENCOUNTER — Ambulatory Visit (INDEPENDENT_AMBULATORY_CARE_PROVIDER_SITE_OTHER): Payer: Medicare HMO

## 2019-01-14 ENCOUNTER — Other Ambulatory Visit: Payer: Self-pay

## 2019-01-14 DIAGNOSIS — Z23 Encounter for immunization: Secondary | ICD-10-CM

## 2019-01-14 DIAGNOSIS — Z1231 Encounter for screening mammogram for malignant neoplasm of breast: Secondary | ICD-10-CM | POA: Diagnosis not present

## 2019-01-14 LAB — HM MAMMOGRAPHY

## 2019-01-19 ENCOUNTER — Encounter: Payer: Self-pay | Admitting: *Deleted

## 2019-02-26 IMAGING — CT CT ABD-PELV W/ CM
2 of 5 series · 15 of 46 positions shown, 17 images · IV contrast (ISOVUE 300)
Comparison: None.

CLINICAL DATA: LEFT limb ischemia. Arterial occlusion runoff.
Evaluate for extrinsic compression of femoral vein.

EXAM:
CT ABDOMEN AND PELVIS WITH CONTRAST
TECHNIQUE: Multidetector CT imaging of the abdomen and pelvis was performed
using the standard protocol following bolus administration of
intravenous contrast.
CONTRAST:  100mL CA2PPV-299 IOPAMIDOL (CA2PPV-299) INJECTION 61%

[Series 2: abd/pel w · axial · 0.80mm/px · z∈[-382,+18]mm · 12 of 90 slices shown, 14 images]
[im 5/90  soft-tissue]
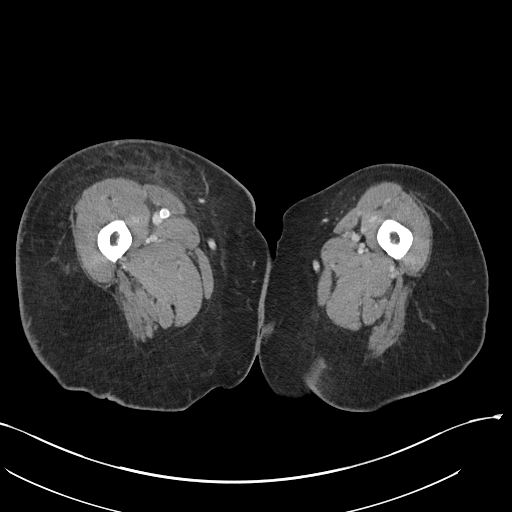
[im 5/90  bone]
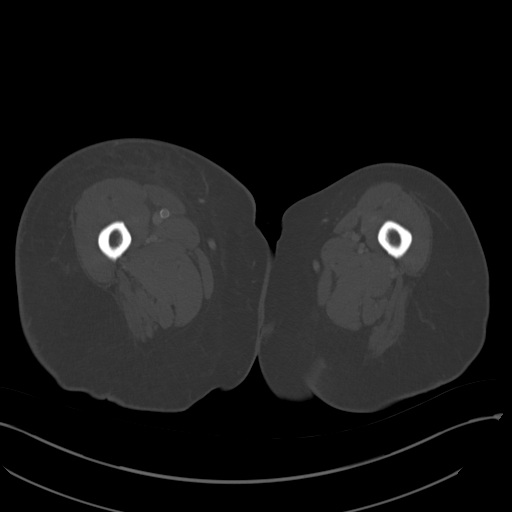
[im 14/90  soft-tissue]
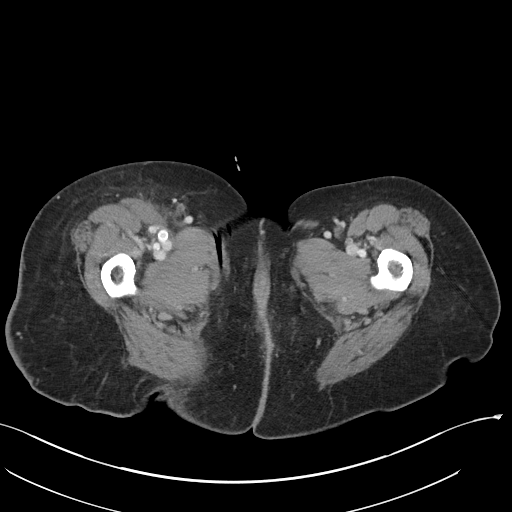
[im 18/90  soft-tissue]
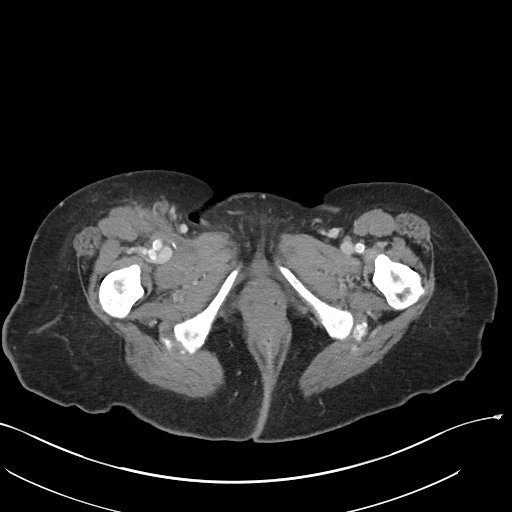
[im 27/90  soft-tissue]
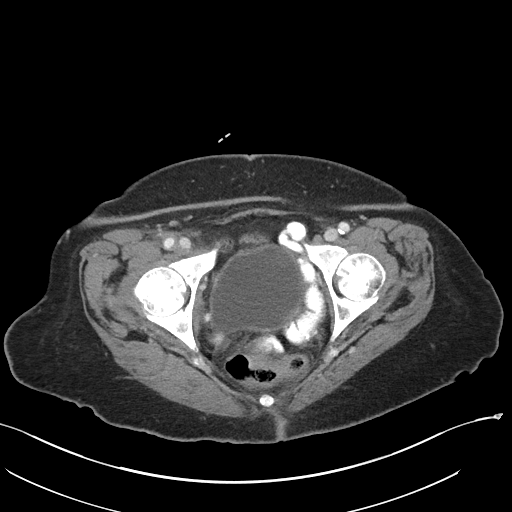
[im 36/90  soft-tissue]
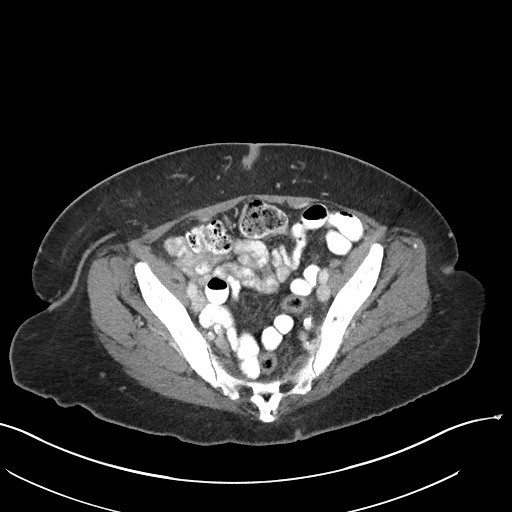
[im 41/90  soft-tissue]
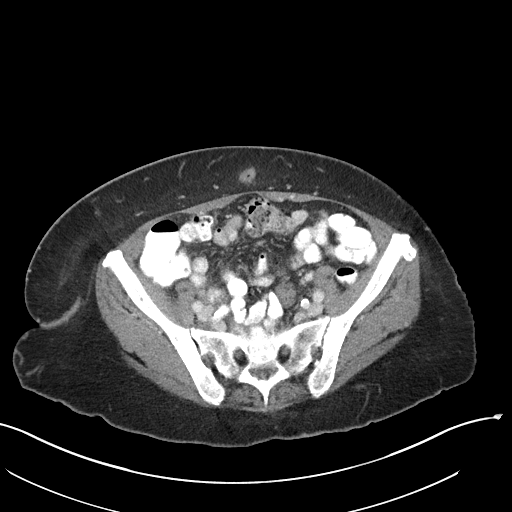
[im 49/90  soft-tissue]
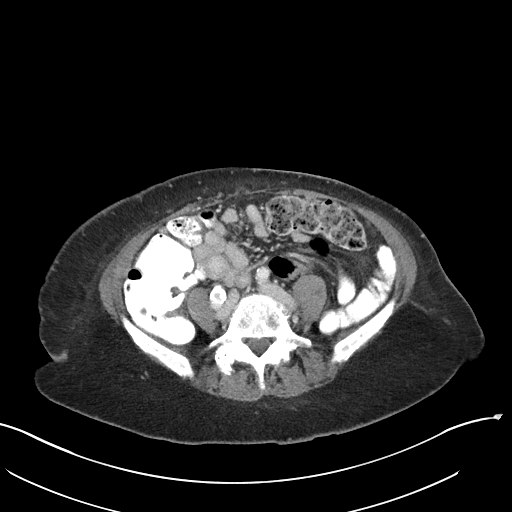
[im 54/90  soft-tissue]
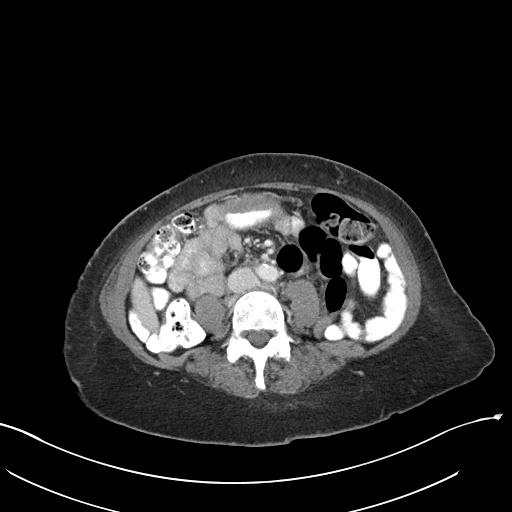
[im 63/90  soft-tissue]
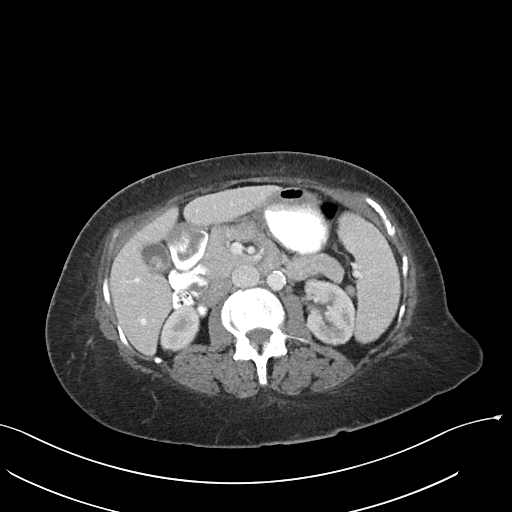
[im 63/90  bone]
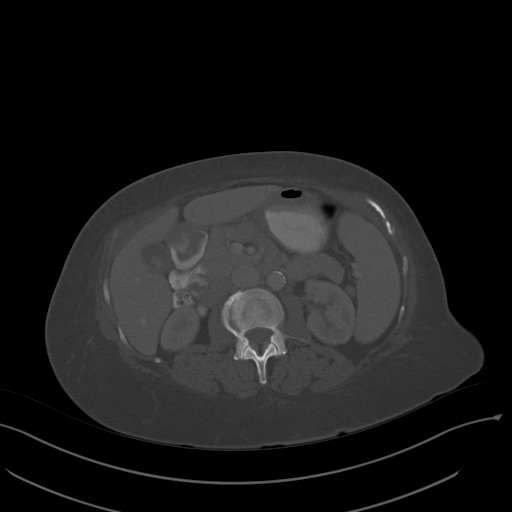
[im 72/90  soft-tissue]
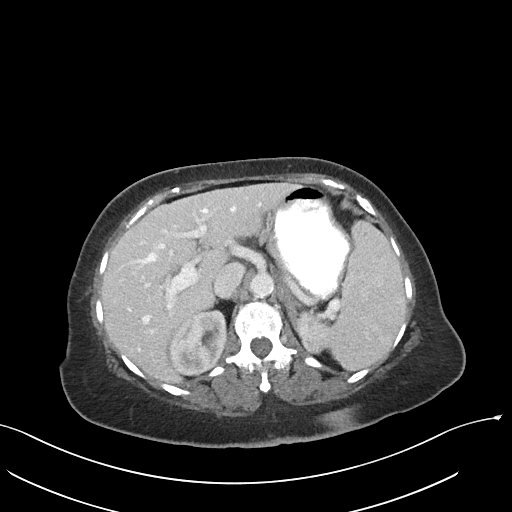
[im 76/90  soft-tissue]
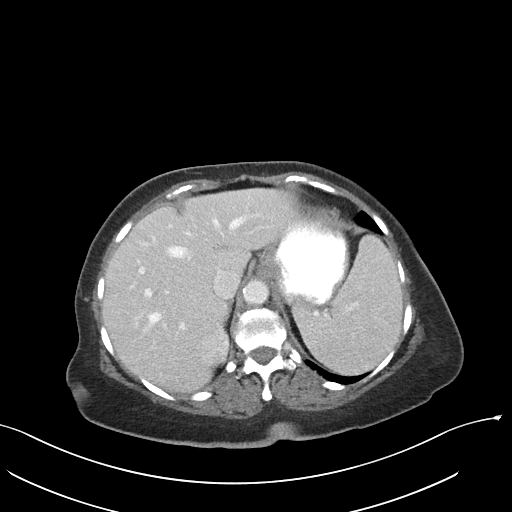
[im 85/90  soft-tissue]
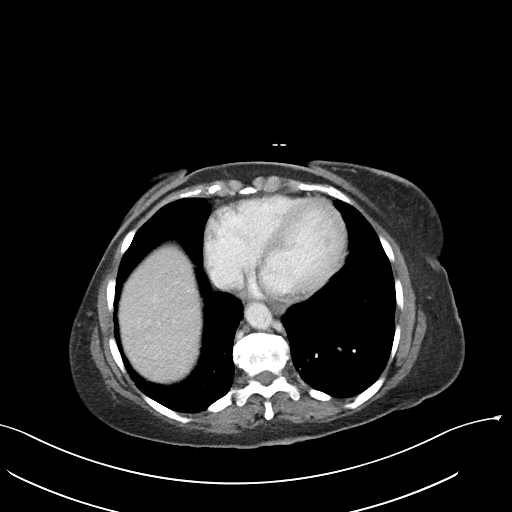

[Series 6: abd/pel w st · coronal · 0.60mm/px · 3 of 68 slices shown]
[im 23/68  soft-tissue]
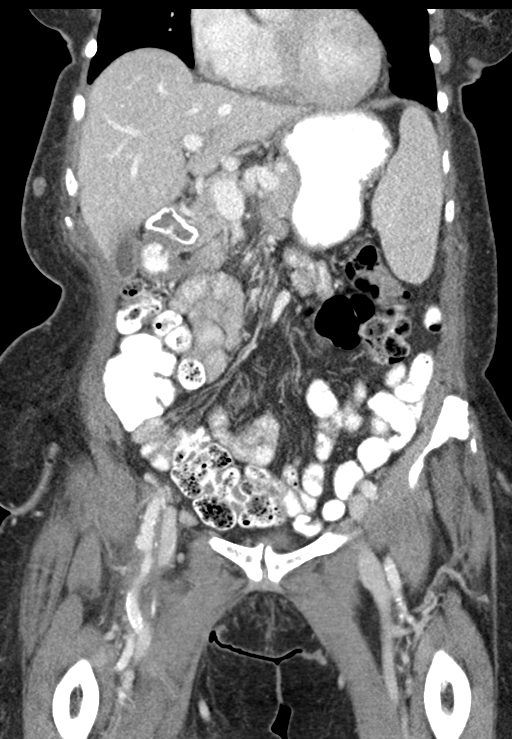
[im 30/68  soft-tissue]
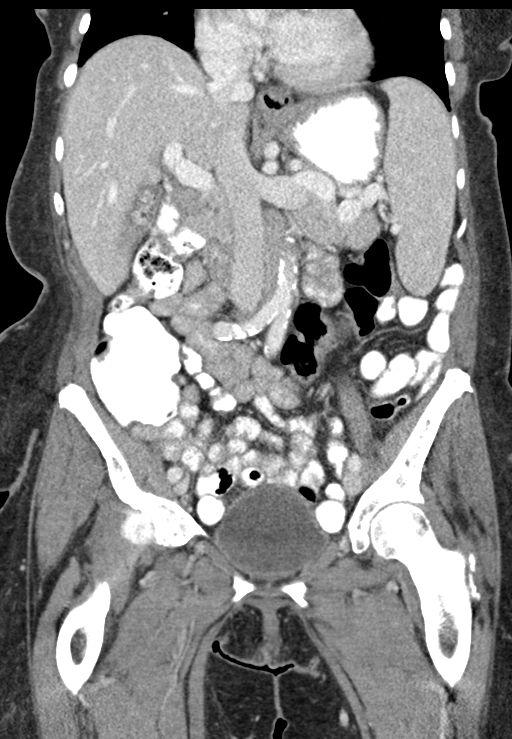
[im 38/68  soft-tissue]
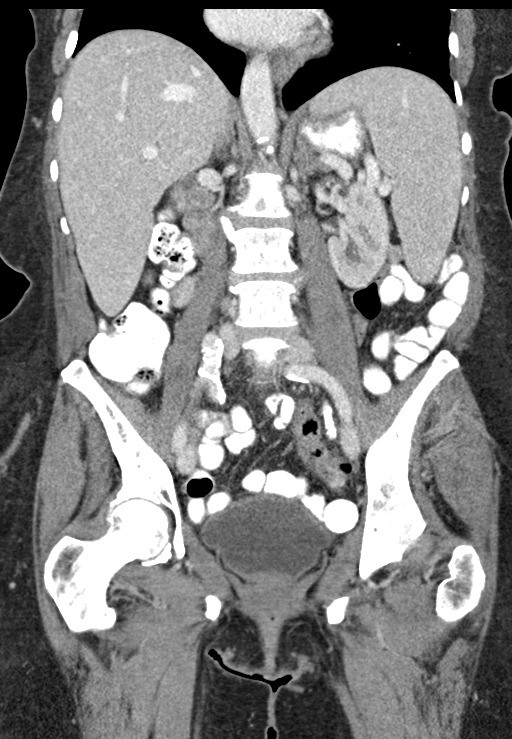

[15 of 46 positions shown; findings below may reference images not displayed]

FINDINGS: Lower chest: Lung bases are clear.

Hepatobiliary: No focal hepatic lesion. No biliary duct dilatation.
Gallbladder is normal. Common bile duct is normal.

Pancreas: Pancreas is normal. No ductal dilatation. No pancreatic
inflammation.

Spleen: Normal spleen

Adrenals/urinary tract: Adrenal glands and kidneys are normal. The
ureters and bladder normal.

Stomach/Bowel: Stomach, small-bowel cecum normal. Colon and
rectosigmoid colon normal.

Vascular/Lymphatic: Intimal calcification of the aorta which is non
aneurysmal. Calcification of the iliac arteries. The inferior vena
cava is normal. No mass compressing the femoral veins in the pelvis
or proximal lower extremities. There is soft tissue inflammation
along the RIGHT the common femoral vessels which may relate to
recent prior per seizures. One small RIGHT external iliac lymph node
measures 10 mm (image 63, series 2). No significant hematoma or
mass.

Reproductive: Post hysterectomy

Other: No free fluid.

Musculoskeletal: No aggressive osseous lesion.
IMPRESSION: 1. Mild soft tissue inflammation along the RIGHT common femoral
vessels likely relates to prior vascular interventional procedures.
2. No evidence of adenopathy or mass within the pelvis or proximal
lower extremities to suggests extrinsic compression of vessels.
3.  Aortic Atherosclerosis ([id number]).

## 2019-05-26 ENCOUNTER — Other Ambulatory Visit: Payer: Self-pay

## 2019-05-26 ENCOUNTER — Encounter: Payer: Self-pay | Admitting: Podiatry

## 2019-05-26 ENCOUNTER — Ambulatory Visit: Payer: Medicare HMO | Admitting: Podiatry

## 2019-05-26 VITALS — Temp 97.1°F

## 2019-05-26 DIAGNOSIS — M79676 Pain in unspecified toe(s): Secondary | ICD-10-CM

## 2019-05-26 DIAGNOSIS — S98132S Complete traumatic amputation of one left lesser toe, sequela: Secondary | ICD-10-CM | POA: Diagnosis not present

## 2019-05-26 DIAGNOSIS — E0842 Diabetes mellitus due to underlying condition with diabetic polyneuropathy: Secondary | ICD-10-CM

## 2019-05-26 DIAGNOSIS — M79609 Pain in unspecified limb: Secondary | ICD-10-CM

## 2019-05-26 DIAGNOSIS — B351 Tinea unguium: Secondary | ICD-10-CM

## 2019-05-26 NOTE — Progress Notes (Signed)
Complaint:  Visit Type: Patient returns to my office for continued preventative foot care services. 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 The patient presents for preventative foot care services. No changes to ROS.  Patient is taking plavix.  Podiatric Exam: Vascular: dorsalis pedis  pulses are weakly  palpable bilateral. Capillary return is immediate. Cold feet noted.. Skin turgor WNL  Sensorium: Diminished Semmes Weinstein monofilament test. Normal tactile sensation bilaterally. Nail Exam: Pt has thick disfigured discolored nails with subungual debris noted bilateral entire nail hallux through fifth toenails except third toenail left foot. Ulcer Exam: There is no evidence of ulcer or pre-ulcerative changes or infection. Orthopedic Exam: Muscle tone and strength are WNL. No limitations in general ROM. No crepitus or effusions noted. Foot type and digits show no abnormalities. Bony prominences are unremarkable. Amputation third toe left foot. Skin: No Porokeratosis. No infection or ulcers  Diagnosis:  Onychomycosis, , Pain in right toe, pain in left toes  Treatment & Plan Procedures and Treatment: Consent by patient was obtained for treatment procedures.   Debridement of mycotic and hypertrophic toenails, 1 through 5 bilateral and clearing of subungual debris. No ulceration, no infection noted. Padding dispensed to cover second toe skin lesion distally. Skin lesion is not draining. Return Visit-Office Procedure: Patient instructed to return to the office for a follow up visit 6 months for continued evaluation and treatment.    Helane Gunther DPM

## 2019-10-19 ENCOUNTER — Other Ambulatory Visit: Payer: Self-pay | Admitting: Family Medicine

## 2019-11-08 ENCOUNTER — Other Ambulatory Visit: Payer: Self-pay | Admitting: Family Medicine

## 2019-11-19 ENCOUNTER — Encounter: Payer: Self-pay | Admitting: Family Medicine

## 2019-11-19 ENCOUNTER — Telehealth: Payer: Self-pay | Admitting: *Deleted

## 2019-11-19 ENCOUNTER — Ambulatory Visit (INDEPENDENT_AMBULATORY_CARE_PROVIDER_SITE_OTHER): Payer: Medicare HMO | Admitting: Family Medicine

## 2019-11-19 ENCOUNTER — Other Ambulatory Visit: Payer: Self-pay

## 2019-11-19 VITALS — BP 132/78 | HR 90 | Temp 98.0°F | Resp 16 | Ht <= 58 in | Wt 139.0 lb

## 2019-11-19 DIAGNOSIS — Z89422 Acquired absence of other left toe(s): Secondary | ICD-10-CM

## 2019-11-19 DIAGNOSIS — D508 Other iron deficiency anemias: Secondary | ICD-10-CM | POA: Diagnosis not present

## 2019-11-19 DIAGNOSIS — I10 Essential (primary) hypertension: Secondary | ICD-10-CM

## 2019-11-19 DIAGNOSIS — Z0000 Encounter for general adult medical examination without abnormal findings: Secondary | ICD-10-CM

## 2019-11-19 DIAGNOSIS — I739 Peripheral vascular disease, unspecified: Secondary | ICD-10-CM | POA: Diagnosis not present

## 2019-11-19 DIAGNOSIS — Z0001 Encounter for general adult medical examination with abnormal findings: Secondary | ICD-10-CM

## 2019-11-19 LAB — COMPLETE METABOLIC PANEL WITH GFR
AG Ratio: 1.8 (calc) (ref 1.0–2.5)
ALT: 14 U/L (ref 6–29)
AST: 25 U/L (ref 10–35)
Albumin: 4.5 g/dL (ref 3.6–5.1)
Alkaline phosphatase (APISO): 58 U/L (ref 37–153)
BUN: 16 mg/dL (ref 7–25)
CO2: 29 mmol/L (ref 20–32)
Calcium: 10.5 mg/dL — ABNORMAL HIGH (ref 8.6–10.4)
Chloride: 104 mmol/L (ref 98–110)
Creat: 0.88 mg/dL (ref 0.60–0.93)
GFR, Est African American: 73 mL/min/{1.73_m2} (ref >=60)
GFR, Est Non African American: 63 mL/min/{1.73_m2} (ref >=60)
Globulin: 2.5 g/dL (calc) (ref 1.9–3.7)
Glucose, Bld: 85 mg/dL (ref 65–99)
Potassium: 5.2 mmol/L (ref 3.5–5.3)
Sodium: 140 mmol/L (ref 135–146)
Total Bilirubin: 1.2 mg/dL (ref 0.2–1.2)
Total Protein: 7 g/dL (ref 6.1–8.1)

## 2019-11-19 LAB — CBC WITH DIFFERENTIAL/PLATELET
Absolute Monocytes: 403 cells/uL (ref 200–950)
Basophils Absolute: 50 cells/uL (ref 0–200)
Basophils Relative: 0.9 %
Eosinophils Absolute: 73 cells/uL (ref 15–500)
Eosinophils Relative: 1.3 %
HCT: 35.8 % (ref 35.0–45.0)
Hemoglobin: 11.3 g/dL — ABNORMAL LOW (ref 11.7–15.5)
Lymphs Abs: 1574 cells/uL (ref 850–3900)
MCH: 30.1 pg (ref 27.0–33.0)
MCHC: 31.6 g/dL — ABNORMAL LOW (ref 32.0–36.0)
MCV: 95.5 fL (ref 80.0–100.0)
MPV: 10 fL (ref 7.5–12.5)
Monocytes Relative: 7.2 %
Neutro Abs: 3500 cells/uL (ref 1500–7800)
Neutrophils Relative %: 62.5 %
Platelets: 479 10*3/uL — ABNORMAL HIGH (ref 140–400)
RBC: 3.75 10*6/uL — ABNORMAL LOW (ref 3.80–5.10)
RDW: 16.5 % — ABNORMAL HIGH (ref 11.0–15.0)
Total Lymphocyte: 28.1 %
WBC: 5.6 10*3/uL (ref 3.8–10.8)

## 2019-11-19 LAB — LIPID PANEL
Cholesterol: 206 mg/dL — ABNORMAL HIGH (ref <200)
HDL: 62 mg/dL (ref >=50)
LDL Cholesterol (Calc): 122 mg/dL (calc) — ABNORMAL HIGH
Non-HDL Cholesterol (Calc): 144 mg/dL (calc) — ABNORMAL HIGH (ref <130)
Total CHOL/HDL Ratio: 3.3 (calc) (ref <5.0)
Triglycerides: 114 mg/dL (ref <150)

## 2019-11-19 MED ORDER — LOSARTAN POTASSIUM 50 MG PO TABS: 50.0000 mg | ORAL_TABLET | Freq: Every day | ORAL | 3 refills | Status: DC

## 2019-11-19 MED ORDER — CLOPIDOGREL BISULFATE 75 MG PO TABS: 75.0000 mg | ORAL_TABLET | Freq: Every day | ORAL | 3 refills | Status: DC

## 2019-11-19 NOTE — Progress Notes (Signed)
Subjective:    Patient ID: Ann Powell, female    DOB: March 07, 1941, 79 y.o.   MRN: [id number]  HPI Patient is here today for a wellness exam.  Her blood pressure is 132/78.  She denies any falls.  She denies any depression.  She denies any memory loss.  Past medical history is significant for peripheral vascular disease.  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  She has a history of peripheral stents.  She is on aspirin and Plavix for anticoagulation.  She denies any chest pain shortness of breath or dyspnea on exertion.  Her mammogram was last done in October of last year.  She is already scheduled that for this year.  She does not require a Pap smear due to her history of a hysterectomy.  She has never had a colonoscopy and refuses a colonoscopy but she will consent to Cologuard. Past Medical History:  Diagnosis Date  . History of gastric ulcer    "after hysterectomy"  . Hypertension   . PVD (peripheral vascular disease) (HCC)   . Ulcer    Past Surgical History:  Procedure Laterality Date  . ABDOMINAL HYSTERECTOMY    . LOWER EXTREMITY INTERVENTION N/A 09/02/2016   Procedure: Lower Extremity Intervention;  Surgeon: Runell Gess, MD;  Location: [facility] INVASIVE CV LAB;  Service: Cardiovascular;  Laterality: N/A;  . TOE AMPUTATION     left 3rd distal to MTP (Dr. Logan Bores) due to chronic osteomyelitis and ischemia.   . TONSILLECTOMY     Current Outpatient Medications on File Prior to Visit  Medication Sig Dispense Refill  . aspirin (GOODSENSE ASPIRIN) 325 MG tablet Take 325 mg by mouth.    . carbonyl iron (FEOSOL) 45 MG TABS tablet Take 45 mg by mouth.    . cholecalciferol (VITAMIN D) 1000 units tablet Take 1,000 Units by mouth daily.    Marland Kitchen co-enzyme Q-10 30 MG capsule Take 30 mg by mouth daily.     . magnesium gluconate (MAGONATE) 500 MG tablet Take 500 mg by mouth daily.    Marland Kitchen UNABLE  TO FIND Take by mouth.    . vitamin B-12 (CYANOCOBALAMIN) 1000 MCG tablet Take 1,000 mcg by mouth daily.     No current facility-administered medications on file prior to visit.   No Known Allergies Social History   Socioeconomic History  . Marital status: Single    Spouse name: Not on file  . Number of children: Not on file  . Years of education: Not on file  . Highest education level: Not on file  Occupational History  . Not on file  Tobacco Use  . Smoking status: Never Smoker  . Smokeless tobacco: Never Used  Vaping Use  . Vaping Use: Never used  Substance and Sexual Activity  . Alcohol use: No  . Drug use: No  . Sexual activity: Never  Other Topics Concern  . Not on file  Social History Narrative  . Not on file   Social Determinants of Health   Financial Resource Strain:   . Difficulty of Paying Living Expenses: Not on file  Food Insecurity:   . Worried About Programme researcher, broadcasting/film/video in the Last Year: Not on file  . Ran Out of Food in the Last Year: Not on file  Transportation Needs:   . Lack of Transportation (Medical): Not on file  . Lack  of Transportation (Non-Medical): Not on file  Physical Activity:   . Days of Exercise per Week: Not on file  . Minutes of Exercise per Session: Not on file  Stress:   . Feeling of Stress : Not on file  Social Connections:   . Frequency of Communication with Friends and Family: Not on file  . Frequency of Social Gatherings with Friends and Family: Not on file  . Attends Religious Services: Not on file  . Active Member of Clubs or Organizations: Not on file  . Attends Banker Meetings: Not on file  . Marital Status: Not on file  Intimate Partner Violence:   . Fear of Current or Ex-Partner: Not on file  . Emotionally Abused: Not on file  . Physically Abused: Not on file  . Sexually Abused: Not on file      Review of Systems  All other systems reviewed and are negative.      Objective:   Physical  Exam Vitals reviewed.  Constitutional:      General: She is not in acute distress.    Appearance: Normal appearance. She is well-developed and normal weight. She is not ill-appearing, toxic-appearing or diaphoretic.  HENT:     Head: Normocephalic and atraumatic.     Right Ear: Tympanic membrane, ear canal and external ear normal. There is no impacted cerumen.     Left Ear: Tympanic membrane, ear canal and external ear normal. There is no impacted cerumen.     Nose: Nose normal. No congestion or rhinorrhea.     Mouth/Throat:     Mouth: Mucous membranes are moist.     Pharynx: No oropharyngeal exudate or posterior oropharyngeal erythema.  Eyes:     General: No scleral icterus.       Right eye: No discharge.        Left eye: No discharge.     Extraocular Movements: Extraocular movements intact.     Conjunctiva/sclera: Conjunctivae normal.     Pupils: Pupils are equal, round, and reactive to light.  Neck:     Vascular: No carotid bruit or JVD.  Cardiovascular:     Rate and Rhythm: Normal rate and regular rhythm.     Pulses:          Dorsalis pedis pulses are 1+ on the right side and 1+ on the left side.       Posterior tibial pulses are 1+ on the right side and 1+ on the left side.     Heart sounds: Normal heart sounds. No murmur heard.   Pulmonary:     Effort: Pulmonary effort is normal. No respiratory distress.     Breath sounds: Normal breath sounds. No stridor. No wheezing, rhonchi or rales.  Chest:     Chest wall: No tenderness.  Abdominal:     General: Bowel sounds are normal. There is no distension.     Palpations: Abdomen is soft.     Tenderness: There is no abdominal tenderness. There is no guarding or rebound.  Musculoskeletal:        General: Deformity present. No swelling or signs of injury.     Cervical back: Rigidity present.     Right lower leg: No edema.     Left lower leg: No edema.     Right foot: Normal range of motion.     Left foot: Normal range of motion.  Deformity present.       Feet:  Lymphadenopathy:     Cervical: No  cervical adenopathy.  Skin:    General: Skin is warm.     Findings: No bruising, erythema, lesion or rash.  Neurological:     General: No focal deficit present.     Mental Status: She is alert and oriented to person, place, and time. Mental status is at baseline.     Cranial Nerves: No cranial nerve deficit.     Sensory: No sensory deficit.     Motor: No weakness.     Coordination: Coordination normal.     Gait: Gait normal.     Deep Tendon Reflexes: Reflexes normal.  Psychiatric:        Mood and Affect: Mood normal.        Behavior: Behavior normal.        Thought Content: Thought content normal.        Judgment: Judgment normal.            Assessment & Plan:  Benign essential HTN - Plan: CBC with Differential/Platelet, COMPLETE METABOLIC PANEL WITH GFR, Lipid panel  PVD (peripheral vascular disease) (HCC)  Other iron deficiency anemia  General medical exam  Status post amputation of lesser toe of left foot (HCC)  I will set the patient up for a Cologuard for colon cancer screening.  She is already scheduled her mammogram for October.  She does not require a Pap smear due to her history of a hysterectomy.  Her bone density test was performed 2 years ago but had a excellent T score.  Therefore I do not feel that it is due again until 2024.  She denies any falls, depression, or memory loss.  Immunizations are up-to-date: Immunization History  Administered Date(s) Administered  . Fluad Quad(high Dose 65+) 01/14/2019  . Influenza, High Dose Seasonal PF 01/17/2017  . Influenza,inj,Quad PF,6+ Mos 12/28/2013, 02/13/2015, 01/26/2016, 12/12/2017  . PFIZER SARS-COV-2 Vaccination 09/17/2019, 10/08/2019  . Pneumococcal Conjugate-13 10/17/2014  . Pneumococcal Polysaccharide-23 10/25/2013  . Zoster 10/19/2014   I did recommend a flu shot this fall when they become available.  I will check a CBC, CMP, fasting lipid  panel.  Goal LDL cholesterol is less than 70 given her history of peripheral vascular disease.  Recommended a Covid booster 8 months after her last shot

## 2019-11-19 NOTE — Telephone Encounter (Signed)
Received verbal orders for Cologuard.   Order placed via [employer].   Cologuard (Order [id number])

## 2019-11-19 NOTE — Telephone Encounter (Signed)
-----   Message from Donita Brooks, MD sent at 11/19/2019  9:33 AM EDT ----- Please schedule cologuard

## 2019-11-22 ENCOUNTER — Other Ambulatory Visit: Payer: Self-pay

## 2019-11-22 DIAGNOSIS — E7800 Pure hypercholesterolemia, unspecified: Secondary | ICD-10-CM

## 2019-11-22 MED ORDER — ROSUVASTATIN CALCIUM 20 MG PO TABS: 20.0000 mg | ORAL_TABLET | Freq: Every day | ORAL | 3 refills | Status: DC

## 2019-11-24 ENCOUNTER — Ambulatory Visit: Payer: Medicare HMO | Admitting: Podiatry

## 2019-11-24 ENCOUNTER — Other Ambulatory Visit: Payer: Self-pay

## 2019-11-24 ENCOUNTER — Encounter: Payer: Self-pay | Admitting: Podiatry

## 2019-11-24 DIAGNOSIS — E0842 Diabetes mellitus due to underlying condition with diabetic polyneuropathy: Secondary | ICD-10-CM

## 2019-11-24 DIAGNOSIS — M79609 Pain in unspecified limb: Secondary | ICD-10-CM

## 2019-11-24 DIAGNOSIS — B351 Tinea unguium: Secondary | ICD-10-CM

## 2019-11-24 DIAGNOSIS — S98132S Complete traumatic amputation of one left lesser toe, sequela: Secondary | ICD-10-CM

## 2019-11-24 NOTE — Progress Notes (Signed)
This patient returns to my office for at risk foot care.  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 This patient is unable to cut nails himself since the patient cannot reach his nails.These nails are painful walking and wearing shoes.  This patient presents for at risk foot care today.  General Appearance  Alert, conversant and in no acute stress.  Vascular  Dorsalis pedis and posterior tibial  pulses are weakly  palpable  bilaterally.  Capillary return is within normal limits  bilaterally. Temperature is within normal limits  bilaterally.  Neurologic  Senn-Weinstein monofilament wire test within normal limits  bilaterally. Muscle power within normal limits bilaterally.  Nails Thick disfigured discolored nails with subungual debris  from hallux to fifth toes bilaterally. No evidence of bacterial infection or drainage bilaterally.  Orthopedic  No limitations of motion  feet .  No crepitus or effusions noted.  No bony pathology or digital deformities noted.  Skin  normotropic skin with no porokeratosis noted bilaterally.  No signs of infections or ulcers noted.     Onychomycosis  Pain in right toes  Pain in left toes  Consent was obtained for treatment procedures.   Mechanical debridement of nails 1-5  bilaterally performed with a nail nipper.  Filed with dremel without incident.    Return office visit      6 months                Told patient to return for periodic foot care and evaluation due to potential at risk complications.   Helane Gunther DPM

## 2019-12-13 ENCOUNTER — Encounter: Payer: Self-pay | Admitting: Family Medicine

## 2019-12-13 DIAGNOSIS — Z1212 Encounter for screening for malignant neoplasm of rectum: Secondary | ICD-10-CM | POA: Diagnosis not present

## 2019-12-13 DIAGNOSIS — Z1211 Encounter for screening for malignant neoplasm of colon: Secondary | ICD-10-CM | POA: Diagnosis not present

## 2019-12-13 LAB — COLOGUARD: Cologuard: NEGATIVE

## 2019-12-20 LAB — COLOGUARD: COLOGUARD: NEGATIVE

## 2019-12-23 NOTE — Telephone Encounter (Signed)
Received the results of Cologuard screening.   Screening noted negative.   A negative result indicates a low likelihood of colorectal cancer is present. Following a negative Cologuard result, the [employer] Society recommends a Cologuard re-screening interval of 3 years.   Letter sent.   

## 2020-01-20 ENCOUNTER — Encounter: Payer: Self-pay | Admitting: Family Medicine

## 2020-01-20 ENCOUNTER — Other Ambulatory Visit: Payer: Self-pay

## 2020-01-20 ENCOUNTER — Ambulatory Visit (INDEPENDENT_AMBULATORY_CARE_PROVIDER_SITE_OTHER): Payer: Medicare HMO | Admitting: Family Medicine

## 2020-01-20 VITALS — BP 112/64 | HR 74 | Temp 97.6°F

## 2020-01-20 DIAGNOSIS — Z23 Encounter for immunization: Secondary | ICD-10-CM

## 2020-01-20 DIAGNOSIS — Z1231 Encounter for screening mammogram for malignant neoplasm of breast: Secondary | ICD-10-CM | POA: Diagnosis not present

## 2020-05-26 ENCOUNTER — Ambulatory Visit: Payer: Medicare HMO | Admitting: Podiatry

## 2020-08-09 ENCOUNTER — Ambulatory Visit: Payer: Medicare HMO | Admitting: Podiatry

## 2020-08-09 ENCOUNTER — Other Ambulatory Visit: Payer: Self-pay

## 2020-08-09 ENCOUNTER — Encounter: Payer: Self-pay | Admitting: Podiatry

## 2020-08-09 DIAGNOSIS — S98132S Complete traumatic amputation of one left lesser toe, sequela: Secondary | ICD-10-CM | POA: Diagnosis not present

## 2020-08-09 DIAGNOSIS — M79674 Pain in right toe(s): Secondary | ICD-10-CM | POA: Diagnosis not present

## 2020-08-09 DIAGNOSIS — B351 Tinea unguium: Secondary | ICD-10-CM | POA: Diagnosis not present

## 2020-08-09 DIAGNOSIS — E0842 Diabetes mellitus due to underlying condition with diabetic polyneuropathy: Secondary | ICD-10-CM

## 2020-08-09 DIAGNOSIS — M79675 Pain in left toe(s): Secondary | ICD-10-CM | POA: Diagnosis not present

## 2020-08-09 NOTE — Progress Notes (Signed)
This patient returns to my office for at risk foot care.  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  This patient is unable to cut nails herself since the patient cannot reach her nails.These nails are painful walking and wearing shoes.  This patient presents for at risk foot care today.  General Appearance  Alert, conversant and in no acute stress.  Vascular  Dorsalis pedis and posterior tibial  pulses are weakly  palpable  bilaterally.  Capillary return is within normal limits  bilaterally. Temperature is within normal limits  bilaterally.  Neurologic  Senn-Weinstein monofilament wire test within normal limits  bilaterally. Muscle power within normal limits bilaterally.  Nails Thick disfigured discolored nails with subungual debris hallux nails  bilaterally. No evidence of bacterial infection or drainage bilaterally.  Orthopedic  No limitations of motion  feet .  No crepitus or effusions noted.  No bony pathology or digital deformities noted.  Skin  normotropic skin with no porokeratosis noted bilaterally.  No signs of infections or ulcers noted.     Onychomycosis  Pain in right toes  Pain in left toes  Consent was obtained for treatment procedures.   Mechanical debridement of nails 1-5  bilaterally performed with a nail nipper.  Filed with dremel without incident.    Return office visit      6 months                Told patient to return for periodic foot care and evaluation due to potential at risk complications.   Helane Gunther DPM

## 2020-10-02 ENCOUNTER — Other Ambulatory Visit: Payer: Self-pay | Admitting: Family Medicine

## 2020-10-18 ENCOUNTER — Other Ambulatory Visit: Payer: Self-pay | Admitting: Family Medicine

## 2020-11-20 ENCOUNTER — Ambulatory Visit (INDEPENDENT_AMBULATORY_CARE_PROVIDER_SITE_OTHER): Payer: Medicare HMO | Admitting: Family Medicine

## 2020-11-20 ENCOUNTER — Encounter: Payer: Self-pay | Admitting: Family Medicine

## 2020-11-20 ENCOUNTER — Other Ambulatory Visit: Payer: Self-pay

## 2020-11-20 VITALS — BP 148/88 | HR 89 | Temp 98.3°F | Wt 140.0 lb

## 2020-11-20 DIAGNOSIS — Z89422 Acquired absence of other left toe(s): Secondary | ICD-10-CM

## 2020-11-20 DIAGNOSIS — I739 Peripheral vascular disease, unspecified: Secondary | ICD-10-CM

## 2020-11-20 DIAGNOSIS — E7800 Pure hypercholesterolemia, unspecified: Secondary | ICD-10-CM | POA: Diagnosis not present

## 2020-11-20 DIAGNOSIS — Z0001 Encounter for general adult medical examination with abnormal findings: Secondary | ICD-10-CM

## 2020-11-20 DIAGNOSIS — D508 Other iron deficiency anemias: Secondary | ICD-10-CM

## 2020-11-20 DIAGNOSIS — R6889 Other general symptoms and signs: Secondary | ICD-10-CM | POA: Diagnosis not present

## 2020-11-20 DIAGNOSIS — I10 Essential (primary) hypertension: Secondary | ICD-10-CM | POA: Diagnosis not present

## 2020-11-20 DIAGNOSIS — Z0000 Encounter for general adult medical examination without abnormal findings: Secondary | ICD-10-CM

## 2020-11-20 NOTE — Progress Notes (Signed)
Subjective:    Patient ID: Ann Powell, female    DOB: Jan 26, 1941, 80 y.o.   MRN: [id number]  HPI Patient is a 80 y/o AAF here today for a wellness exam.  She denies any falls.  She denies any depression.  She denies any memory loss.  Past medical history is significant for peripheral vascular disease.  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  She has a history of peripheral stents.  She is on aspirin and Plavix for anticoagulation.  She denies any chest pain shortness of breath or dyspnea on exertion.  She does not require a Pap smear due to her history of a hysterectomy.  Cologuard was negative 12/2019.  Last mammogram was October 2021 and was normal.  Bone density was performed in 2019 and showed a T score of -0.3 which is outstanding for her age.  At this point I would recommend a bone density test again in 2024.  She denies any concerns today.  She also has been checking her blood pressure at home and finding it elevated.  She states that her systolic blood pressure is between 145 and 150 despite taking losartan.  She has not been taking rosuvastatin regularly.  She states that she had diarrhea when she was taking the rosuvastatin so she stopped it. Past Medical History:  Diagnosis Date   History of gastric ulcer    "after hysterectomy"   Hypertension    PVD (peripheral vascular disease) (HCC)    Ulcer    Past Surgical History:  Procedure Laterality Date   ABDOMINAL HYSTERECTOMY     LOWER EXTREMITY INTERVENTION N/A 09/02/2016   Procedure: Lower Extremity Intervention;  Surgeon: Runell Gess, MD;  Location: [facility] INVASIVE CV LAB;  Service: Cardiovascular;  Laterality: N/A;   TOE AMPUTATION     left 3rd distal to MTP (Dr. Logan Bores) due to chronic osteomyelitis and ischemia.    TONSILLECTOMY     Current Outpatient Medications on File Prior to Visit  Medication Sig Dispense Refill    aspirin (GOODSENSE ASPIRIN) 325 MG tablet Take 325 mg by mouth.     carbonyl iron (FEOSOL) 45 MG TABS tablet Take 45 mg by mouth.     cholecalciferol (VITAMIN D) 1000 units tablet Take 1,000 Units by mouth daily.     clopidogrel (PLAVIX) 75 MG tablet TAKE 1 TABLET (75 MG TOTAL) BY MOUTH DAILY. 90 tablet 3   co-enzyme Q-10 30 MG capsule Take 30 mg by mouth daily.      losartan (COZAAR) 50 MG tablet TAKE 1 TABLET (50 MG TOTAL) BY MOUTH DAILY. (NEED MD APPOINTMENT FOR REFILLS) 90 tablet 3   magnesium gluconate (MAGONATE) 500 MG tablet Take 500 mg by mouth daily.     rosuvastatin (CRESTOR) 20 MG tablet Take 1 tablet (20 mg total) by mouth daily. 90 tablet 3   UNABLE TO FIND Take by mouth.     vitamin B-12 (CYANOCOBALAMIN) 1000 MCG tablet Take 1,000 mcg by mouth daily.     No current facility-administered medications on file prior to visit.   No Known Allergies Social History   Socioeconomic History   Marital status: Single    Spouse name: Not on file   Number of children: Not on file   Years of education: Not on file   Highest education level: Not on file  Occupational History   Not on  file  Tobacco Use   Smoking status: Never   Smokeless tobacco: Never  Vaping Use   Vaping Use: Never used  Substance and Sexual Activity   Alcohol use: No   Drug use: No   Sexual activity: Never  Other Topics Concern   Not on file  Social History Narrative   Not on file   Social Determinants of Health   Financial Resource Strain: Not on file  Food Insecurity: Not on file  Transportation Needs: Not on file  Physical Activity: Not on file  Stress: Not on file  Social Connections: Not on file  Intimate Partner Violence: Not on file      Review of Systems  All other systems reviewed and are negative.     Objective:   Physical Exam Vitals reviewed.  Constitutional:      General: She is not in acute distress.    Appearance: Normal appearance. She is well-developed and normal weight.  She is not ill-appearing, toxic-appearing or diaphoretic.  HENT:     Head: Normocephalic and atraumatic.     Right Ear: Tympanic membrane, ear canal and external ear normal. There is no impacted cerumen.     Left Ear: Tympanic membrane, ear canal and external ear normal. There is no impacted cerumen.     Nose: Nose normal. No congestion or rhinorrhea.     Mouth/Throat:     Mouth: Mucous membranes are moist.     Pharynx: No oropharyngeal exudate or posterior oropharyngeal erythema.  Eyes:     General: No scleral icterus.       Right eye: No discharge.        Left eye: No discharge.     Extraocular Movements: Extraocular movements intact.     Conjunctiva/sclera: Conjunctivae normal.     Pupils: Pupils are equal, round, and reactive to light.  Neck:     Vascular: No carotid bruit or JVD.  Cardiovascular:     Rate and Rhythm: Normal rate and regular rhythm.     Pulses:          Dorsalis pedis pulses are 1+ on the right side and 1+ on the left side.       Posterior tibial pulses are 1+ on the right side and 1+ on the left side.     Heart sounds: Normal heart sounds. No murmur heard. Pulmonary:     Effort: Pulmonary effort is normal. No respiratory distress.     Breath sounds: Normal breath sounds. No stridor. No wheezing, rhonchi or rales.  Chest:     Chest wall: No tenderness.  Abdominal:     General: Bowel sounds are normal. There is no distension.     Palpations: Abdomen is soft.     Tenderness: There is no abdominal tenderness. There is no guarding or rebound.  Musculoskeletal:        General: Deformity present. No swelling or signs of injury.     Cervical back: Rigidity present.     Right lower leg: No edema.     Left lower leg: No edema.     Right foot: Normal range of motion.     Left foot: Normal range of motion. Deformity present.       Feet:  Lymphadenopathy:     Cervical: No cervical adenopathy.  Skin:    General: Skin is warm.     Findings: No bruising, erythema,  lesion or rash.  Neurological:     General: No focal deficit present.  Mental Status: She is alert and oriented to person, place, and time. Mental status is at baseline.     Cranial Nerves: No cranial nerve deficit.     Sensory: No sensory deficit.     Motor: No weakness.     Coordination: Coordination normal.     Gait: Gait normal.     Deep Tendon Reflexes: Reflexes normal.  Psychiatric:        Mood and Affect: Mood normal.        Behavior: Behavior normal.        Thought Content: Thought content normal.        Judgment: Judgment normal.           Assessment & Plan:  General medical exam  Benign essential HTN - Plan: CBC with Differential/Platelet, COMPLETE METABOLIC PANEL WITH GFR, Lipid panel, Vitamin B12, Iron  PVD (peripheral vascular disease) (HCC) - Plan: CBC with Differential/Platelet, COMPLETE METABOLIC PANEL WITH GFR, Lipid panel, Vitamin B12, Iron  Status post amputation of lesser toe of left foot (HCC)  Other iron deficiency anemia - Plan: CBC with Differential/Platelet, COMPLETE METABOLIC PANEL WITH GFR, Lipid panel, Vitamin B12, Iron  Pure hypercholesterolemia - Plan: CBC with Differential/Platelet, COMPLETE METABOLIC PANEL WITH GFR, Lipid panel, Vitamin B12, Iron Blood pressure is too high today.  I recommended increasing losartan to 50 mg twice daily and then rechecking blood pressure in 2 to 3 weeks.  Regarding her vaccinations, I recommended for a booster on her COVID shot as she is only had 2.  I recommended a flu shot when they become available.  Also recommended Shingrix as she had Zostavax 6 years ago.  Regarding her cancer screening, she is already scheduled a mammogram in October.  The remainder of her cancer screening is not required again due to age.  Bone density test will be due again in 2024.  I will check CBC CMP lipid panel.  Goal LDL cholesterol is less than 70 given her history of ASCVD.  Given her history of anemia I will check a B12 and iron  level as she is taking B12 and iron on a daily basis.

## 2020-11-21 LAB — CBC WITH DIFFERENTIAL/PLATELET
Absolute Monocytes: 432 cells/uL (ref 200–950)
Basophils Absolute: 60 cells/uL (ref 0–200)
Basophils Relative: 1.3 %
Eosinophils Absolute: 51 cells/uL (ref 15–500)
Eosinophils Relative: 1.1 %
HCT: 36 % (ref 35.0–45.0)
Hemoglobin: 11.5 g/dL — ABNORMAL LOW (ref 11.7–15.5)
Lymphs Abs: 1058 cells/uL (ref 850–3900)
MCH: 30.3 pg (ref 27.0–33.0)
MCHC: 31.9 g/dL — ABNORMAL LOW (ref 32.0–36.0)
MCV: 95 fL (ref 80.0–100.0)
MPV: 10 fL (ref 7.5–12.5)
Monocytes Relative: 9.4 %
Neutro Abs: 2999 cells/uL (ref 1500–7800)
Neutrophils Relative %: 65.2 %
Platelets: 393 10*3/uL (ref 140–400)
RBC: 3.79 10*6/uL — ABNORMAL LOW (ref 3.80–5.10)
RDW: 16.7 % — ABNORMAL HIGH (ref 11.0–15.0)
Total Lymphocyte: 23 %
WBC: 4.6 10*3/uL (ref 3.8–10.8)

## 2020-11-21 LAB — COMPLETE METABOLIC PANEL WITH GFR
AG Ratio: 1.8 (calc) (ref 1.0–2.5)
ALT: 14 U/L (ref 6–29)
AST: 26 U/L (ref 10–35)
Albumin: 4.6 g/dL (ref 3.6–5.1)
Alkaline phosphatase (APISO): 54 U/L (ref 37–153)
BUN: 12 mg/dL (ref 7–25)
CO2: 30 mmol/L (ref 20–32)
Calcium: 10.7 mg/dL — ABNORMAL HIGH (ref 8.6–10.4)
Chloride: 104 mmol/L (ref 98–110)
Creat: 0.83 mg/dL (ref 0.60–1.00)
Globulin: 2.5 g/dL (calc) (ref 1.9–3.7)
Glucose, Bld: 81 mg/dL (ref 65–99)
Potassium: 5 mmol/L (ref 3.5–5.3)
Sodium: 141 mmol/L (ref 135–146)
Total Bilirubin: 1.4 mg/dL — ABNORMAL HIGH (ref 0.2–1.2)
Total Protein: 7.1 g/dL (ref 6.1–8.1)
eGFR: 72 mL/min/{1.73_m2} (ref >=60)

## 2020-11-21 LAB — LIPID PANEL
Cholesterol: 215 mg/dL — ABNORMAL HIGH (ref <200)
HDL: 69 mg/dL (ref >=50)
LDL Cholesterol (Calc): 124 mg/dL (calc) — ABNORMAL HIGH
Non-HDL Cholesterol (Calc): 146 mg/dL (calc) — ABNORMAL HIGH (ref <130)
Total CHOL/HDL Ratio: 3.1 (calc) (ref <5.0)
Triglycerides: 113 mg/dL (ref <150)

## 2020-11-21 LAB — VITAMIN B12: Vitamin B-12: >2000 pg/mL — ABNORMAL HIGH (ref 200–1100)

## 2020-11-21 LAB — IRON: Iron: 74 ug/dL (ref 45–160)

## 2020-11-22 ENCOUNTER — Other Ambulatory Visit: Payer: Self-pay | Admitting: *Deleted

## 2020-11-22 DIAGNOSIS — E7800 Pure hypercholesterolemia, unspecified: Secondary | ICD-10-CM

## 2020-11-22 MED ORDER — ROSUVASTATIN CALCIUM 20 MG PO TABS: 20.0000 mg | ORAL_TABLET | Freq: Every day | ORAL | 3 refills | Status: DC

## 2020-11-23 ENCOUNTER — Other Ambulatory Visit: Payer: Self-pay

## 2020-11-23 ENCOUNTER — Other Ambulatory Visit: Payer: Medicare HMO

## 2020-11-24 LAB — PTH, INTACT AND CALCIUM
Calcium: 9.9 mg/dL (ref 8.6–10.4)
PTH: 14 pg/mL — ABNORMAL LOW (ref 16–77)

## 2021-01-25 ENCOUNTER — Other Ambulatory Visit: Payer: Self-pay

## 2021-01-25 ENCOUNTER — Encounter: Payer: Self-pay | Admitting: Family Medicine

## 2021-01-25 ENCOUNTER — Ambulatory Visit (INDEPENDENT_AMBULATORY_CARE_PROVIDER_SITE_OTHER): Payer: Medicare HMO | Admitting: *Deleted

## 2021-01-25 DIAGNOSIS — Z23 Encounter for immunization: Secondary | ICD-10-CM

## 2021-01-25 DIAGNOSIS — Z1231 Encounter for screening mammogram for malignant neoplasm of breast: Secondary | ICD-10-CM | POA: Diagnosis not present

## 2021-01-26 ENCOUNTER — Telehealth: Payer: Self-pay | Admitting: *Deleted

## 2021-01-26 NOTE — Telephone Encounter (Signed)
Received patient BP readings as follows:  AM  PM    BP HR BP  HR  27-Oct 126/72 78 129/74 78  27-Oct 131/74 82 130/75 86   PCP reviewed readings and reports that readings are WNL. No changes will be made.   Call placed to patient and patient made aware.

## 2021-01-31 ENCOUNTER — Ambulatory Visit: Payer: Medicare HMO | Admitting: Podiatry

## 2021-01-31 ENCOUNTER — Encounter: Payer: Self-pay | Admitting: Podiatry

## 2021-01-31 ENCOUNTER — Other Ambulatory Visit: Payer: Self-pay

## 2021-01-31 DIAGNOSIS — E0842 Diabetes mellitus due to underlying condition with diabetic polyneuropathy: Secondary | ICD-10-CM

## 2021-01-31 DIAGNOSIS — M79609 Pain in unspecified limb: Secondary | ICD-10-CM | POA: Diagnosis not present

## 2021-01-31 DIAGNOSIS — S98132S Complete traumatic amputation of one left lesser toe, sequela: Secondary | ICD-10-CM

## 2021-01-31 DIAGNOSIS — B351 Tinea unguium: Secondary | ICD-10-CM

## 2021-01-31 NOTE — Progress Notes (Signed)
This patient returns to my office for at risk foot care.  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  This patient is unable to cut nails herself since the patient cannot reach her nails.These nails are painful walking and wearing shoes.  This patient presents for at risk foot care today.  General Appearance  Alert, conversant and in no acute stress.  Vascular  Dorsalis pedis and posterior tibial  pulses are weakly  palpable  bilaterally.  Capillary return is within normal limits  bilaterally. Temperature is within normal limits  bilaterally.  Neurologic  Senn-Weinstein monofilament wire test within normal limits  bilaterally. Muscle power within normal limits bilaterally.  Nails Thick disfigured discolored nails with subungual debris hallux nails  bilaterally. No evidence of bacterial infection or drainage bilaterally.  Orthopedic  No limitations of motion  feet .  No crepitus or effusions noted.  No bony pathology or digital deformities noted. Amputation 3rd toe left foot.  Skin  normotropic skin with no porokeratosis noted bilaterally.  No signs of infections or ulcers noted.     Onychomycosis  Pain in right toes  Pain in left toes  Consent was obtained for treatment procedures.   Mechanical debridement of nails 1-5  bilaterally performed with a nail nipper.  Filed with dremel without incident.    Return office visit      6 months                Told patient to return for periodic foot care and evaluation due to potential at risk complications.   Helane Gunther DPM

## 2021-04-25 ENCOUNTER — Telehealth: Payer: Self-pay

## 2021-04-25 NOTE — Telephone Encounter (Signed)
Per LOV pt was to increase Losartan to 50mg  BID and recheck BP in 2 weeks. Pt called in BP readings 2 months later, per note no changes were made to pt's medications at that time but it does not state if pt should continue 50mg  BID or return to 50mg  QD.   Please advise, thanks!

## 2021-04-25 NOTE — Telephone Encounter (Signed)
Pharmacist from [facility] called in about getting pt's prescription for losartan (COZAAR) 50 MG tablet updated to 100mg . Please advise.  Cb#: [phone]

## 2021-04-26 MED ORDER — LOSARTAN POTASSIUM 50 MG PO TABS: 50.0000 mg | ORAL_TABLET | Freq: Two times a day (BID) | ORAL | 3 refills | Status: DC

## 2021-04-26 NOTE — Telephone Encounter (Signed)
Rx sent to [facility] for Losartan 50mg  BID.

## 2021-08-01 ENCOUNTER — Ambulatory Visit: Payer: Medicare HMO | Admitting: Podiatry

## 2021-08-03 ENCOUNTER — Ambulatory Visit: Payer: Medicare HMO | Admitting: Podiatry

## 2021-08-03 ENCOUNTER — Encounter: Payer: Self-pay | Admitting: Podiatry

## 2021-08-03 DIAGNOSIS — E119 Type 2 diabetes mellitus without complications: Secondary | ICD-10-CM | POA: Diagnosis not present

## 2021-08-03 DIAGNOSIS — E0842 Diabetes mellitus due to underlying condition with diabetic polyneuropathy: Secondary | ICD-10-CM

## 2021-08-03 DIAGNOSIS — M79676 Pain in unspecified toe(s): Secondary | ICD-10-CM | POA: Diagnosis not present

## 2021-08-03 DIAGNOSIS — Z89422 Acquired absence of other left toe(s): Secondary | ICD-10-CM | POA: Diagnosis not present

## 2021-08-03 DIAGNOSIS — B351 Tinea unguium: Secondary | ICD-10-CM | POA: Diagnosis not present

## 2021-08-03 NOTE — Progress Notes (Signed)
This patient returns to my office for at risk foot care.  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  This patient is unable to cut nails herself since the patient cannot reach her nails.These nails are painful walking and wearing shoes.  This patient presents for at risk foot care today.  General Appearance  Alert, conversant and in no acute stress.  Vascular  Dorsalis pedis and posterior tibial  pulses are weakly  palpable  bilaterally.  Capillary return is within normal limits  bilaterally. Temperature is within normal limits  bilaterally.  Neurologic  Senn-Weinstein monofilament wire test within normal limits  bilaterally. Muscle power within normal limits bilaterally.  Nails Thick disfigured discolored nails with subungual debris hallux nails  bilaterally. No evidence of bacterial infection or drainage bilaterally.  Orthopedic  No limitations of motion  feet .  No crepitus or effusions noted.  No bony pathology or digital deformities noted. Amputation 3rd toe left foot.  Skin  normotropic skin with no porokeratosis noted bilaterally.  No signs of infections or ulcers noted.     Onychomycosis  Pain in right toes  Pain in left toes  Consent was obtained for treatment procedures.   Mechanical debridement of nails 1-5  bilaterally performed with a nail nipper.  Filed with dremel without incident.    Return office visit      6 months                Told patient to return for periodic foot care and evaluation due to potential at risk complications.   Jaleah Lefevre DPM  

## 2021-10-10 ENCOUNTER — Other Ambulatory Visit: Payer: Self-pay | Admitting: Family Medicine

## 2021-10-10 DIAGNOSIS — E7800 Pure hypercholesterolemia, unspecified: Secondary | ICD-10-CM

## 2021-10-10 NOTE — Telephone Encounter (Signed)
Requested Prescriptions  Pending Prescriptions Disp Refills  . rosuvastatin (CRESTOR) 20 MG tablet [Pharmacy Med Name: ROSUVASTATIN CALCIUM 20 MG Tablet] 60 tablet 0    Sig: TAKE 1 TABLET EVERY DAY     Cardiovascular:  Antilipid - Statins 2 Failed - 10/10/2021 11:43 AM      Failed - Valid encounter within last 12 months    Recent Outpatient Visits          10 months ago General medical exam   [facility] Donita Brooks, MD   1 year ago Need for immunization against influenza   [facility] Pickard, Priscille Heidelberg, MD   1 year ago Benign essential HTN   [facility] Tanya Nones, Priscille Heidelberg, MD   3 years ago PVD (peripheral vascular disease) (HCC)   [facility] Pickard, Priscille Heidelberg, MD   4 years ago Wet gangrene [facility])   [facility] Donita Brooks, MD             Failed - Lipid Panel in normal range within the last 12 months    Cholesterol  Date Value Ref Range Status  11/20/2020 215 (H) <200 mg/dL Final   LDL Cholesterol (Calc)  Date Value Ref Range Status  11/20/2020 124 (H) mg/dL (calc) Final    Comment:    Reference range: <100 . Desirable range <100 mg/dL for primary prevention;   <70 mg/dL for patients with CHD or diabetic patients  with > or = 2 CHD risk factors. Marland Kitchen LDL-C is now calculated using the Martin-Hopkins  calculation, which is a validated novel method providing  better accuracy than the Friedewald equation in the  estimation of LDL-C.  Horald Pollen et al. Lenox Ahr. 5361;443(15): 2061-2068  (http://education.[facility].com/faq/FAQ164)    HDL  Date Value Ref Range Status  11/20/2020 69 > OR = 50 mg/dL Final   Triglycerides  Date Value Ref Range Status  11/20/2020 113 <150 mg/dL Final         Passed - Cr in normal range and within 360 days    Creat  Date Value Ref Range Status  11/20/2020 0.83 0.60 - 1.00 mg/dL Final         Passed - Patient is not pregnant

## 2021-11-26 ENCOUNTER — Other Ambulatory Visit: Payer: Self-pay | Admitting: Family Medicine

## 2021-11-27 NOTE — Telephone Encounter (Signed)
Pls call pt to make an annual appt w/pcp for future med refills/evaluation, health  Thank you.

## 2021-12-10 NOTE — Telephone Encounter (Signed)
CPE and fasting labs scheduled for 01/11/22.

## 2022-01-11 ENCOUNTER — Ambulatory Visit (INDEPENDENT_AMBULATORY_CARE_PROVIDER_SITE_OTHER): Payer: Medicare HMO | Admitting: Family Medicine

## 2022-01-11 VITALS — BP 136/82 | HR 80 | Ht 62.0 in | Wt 129.0 lb

## 2022-01-11 DIAGNOSIS — Z0000 Encounter for general adult medical examination without abnormal findings: Secondary | ICD-10-CM

## 2022-01-11 DIAGNOSIS — E7800 Pure hypercholesterolemia, unspecified: Secondary | ICD-10-CM | POA: Diagnosis not present

## 2022-01-11 DIAGNOSIS — I739 Peripheral vascular disease, unspecified: Secondary | ICD-10-CM

## 2022-01-11 DIAGNOSIS — Z780 Asymptomatic menopausal state: Secondary | ICD-10-CM | POA: Diagnosis not present

## 2022-01-11 DIAGNOSIS — I10 Essential (primary) hypertension: Secondary | ICD-10-CM

## 2022-01-11 LAB — CBC WITH DIFFERENTIAL/PLATELET
Absolute Monocytes: 429 cells/uL (ref 200–950)
Basophils Absolute: 81 cells/uL (ref 0–200)
Basophils Relative: 1.4 %
Eosinophils Absolute: 58 cells/uL (ref 15–500)
Eosinophils Relative: 1 %
Hemoglobin: 10.9 g/dL — ABNORMAL LOW (ref 11.7–15.5)
MCV: 93 fL (ref 80.0–100.0)
MPV: 9.7 fL (ref 7.5–12.5)
Monocytes Relative: 7.4 %
Platelets: 392 10*3/uL (ref 140–400)
RDW: 16.6 % — ABNORMAL HIGH (ref 11.0–15.0)
Total Lymphocyte: 22.8 %
WBC: 5.8 10*3/uL (ref 3.8–10.8)

## 2022-01-11 NOTE — Progress Notes (Signed)
Subjective:    Patient ID: Ann Powell, female    DOB: 12/27/1940, 81 y.o.   MRN: [id number]  HPI Patient is a 81 y/o AAF here today for a wellness exam.  She denies any falls.  She denies any depression.  She denies any memory loss.  Past medical history is significant for peripheral vascular disease.  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  She has a history of peripheral stents.  She is here today for physical exam.  Her mammogram has been scheduled for next month and is pending.  Her last bone density test was 4 years ago.  She would like me to schedule this for her.  She has already had her COVID shot.  She is due for a flu shot.  Also recommended RSV.  She denies any falls or depression or memory loss.  Due to her age I would not recommend a colonoscopy or Pap smear.  Overall she denies any concerns.  Her blood pressure is excellent.  Examination of the left foot today reveals an absent third toe distal to the MTP joint.  She has a small purple bruise on the tip of her second toe that appears to be due to pressure.  I pointed this out to her and recommended that she discuss this with her podiatrist when she sees him next month.  Otherwise her exam today is normal Past Medical History:  Diagnosis Date   History of gastric ulcer    "after hysterectomy"   Hypertension    PVD (peripheral vascular disease) ([place])    Ulcer    Past Surgical History:  Procedure Laterality Date   ABDOMINAL HYSTERECTOMY     LOWER EXTREMITY INTERVENTION N/A 09/02/2016   Procedure: Lower Extremity Intervention;  Surgeon: Lorretta Harp, MD;  Location: [facility];  Service: Cardiovascular;  Laterality: N/A;   TOE AMPUTATION     left 3rd distal to MTP (Dr. Amalia Hailey) due to chronic osteomyelitis and ischemia.    TONSILLECTOMY     Current Outpatient Medications on File Prior to Visit  Medication Sig  Dispense Refill   aspirin 325 MG tablet Take 325 mg by mouth.     carbonyl iron (FEOSOL) 45 MG TABS tablet Take 45 mg by mouth.     cholecalciferol (VITAMIN D) 1000 units tablet Take 1,000 Units by mouth daily.     clopidogrel (PLAVIX) 75 MG tablet TAKE 1 TABLET (75 MG TOTAL) BY MOUTH DAILY. 90 tablet 3   co-enzyme Q-10 30 MG capsule Take 30 mg by mouth daily.      losartan (COZAAR) 50 MG tablet Take 1 tablet (50 mg total) by mouth in the morning and at bedtime. 180 tablet 3   magnesium gluconate (MAGONATE) 500 MG tablet Take 500 mg by mouth daily.     rosuvastatin (CRESTOR) 20 MG tablet TAKE 1 TABLET EVERY DAY 60 tablet 0   UNABLE TO FIND Take by mouth.     vitamin B-12 (CYANOCOBALAMIN) 1000 MCG tablet Take 1,000 mcg by mouth daily.     No current facility-administered medications on file prior to visit.   No Known Allergies Social History   Socioeconomic History   Marital status: Single    Spouse name: Not on file   Number of children: Not on file   Years of education: Not on file   Highest education level:  Not on file  Occupational History   Not on file  Tobacco Use   Smoking status: Never   Smokeless tobacco: Never  Vaping Use   Vaping Use: Never used  Substance and Sexual Activity   Alcohol use: No   Drug use: No   Sexual activity: Never  Other Topics Concern   Not on file  Social History Narrative   Not on file   Social Determinants of Health   Financial Resource Strain: Not on file  Food Insecurity: Not on file  Transportation Needs: Not on file  Physical Activity: Not on file  Stress: Not on file  Social Connections: Not on file  Intimate Partner Violence: Not on file      Review of Systems  All other systems reviewed and are negative.      Objective:   Physical Exam Vitals reviewed.  Constitutional:      General: She is not in acute distress.    Appearance: Normal appearance. She is well-developed and normal weight. She is not ill-appearing,  toxic-appearing or diaphoretic.  HENT:     Head: Normocephalic and atraumatic.     Right Ear: Tympanic membrane, ear canal and external ear normal. There is no impacted cerumen.     Left Ear: Tympanic membrane, ear canal and external ear normal. There is no impacted cerumen.     Nose: Nose normal. No congestion or rhinorrhea.     Mouth/Throat:     Mouth: Mucous membranes are moist.     Pharynx: No oropharyngeal exudate or posterior oropharyngeal erythema.  Eyes:     General: No scleral icterus.       Right eye: No discharge.        Left eye: No discharge.     Extraocular Movements: Extraocular movements intact.     Conjunctiva/sclera: Conjunctivae normal.     Pupils: Pupils are equal, round, and reactive to light.  Neck:     Vascular: No carotid bruit or JVD.  Cardiovascular:     Rate and Rhythm: Normal rate and regular rhythm.     Pulses:          Dorsalis pedis pulses are 1+ on the right side and 1+ on the left side.       Posterior tibial pulses are 1+ on the right side and 1+ on the left side.     Heart sounds: Normal heart sounds. No murmur heard. Pulmonary:     Effort: Pulmonary effort is normal. No respiratory distress.     Breath sounds: Normal breath sounds. No stridor. No wheezing, rhonchi or rales.  Chest:     Chest wall: No tenderness.  Abdominal:     General: Bowel sounds are normal. There is no distension.     Palpations: Abdomen is soft.     Tenderness: There is no abdominal tenderness. There is no guarding or rebound.  Musculoskeletal:        General: Deformity present. No swelling or signs of injury.     Cervical back: Rigidity present. Decreased range of motion.     Thoracic back: Scoliosis present.     Right lower leg: No edema.     Left lower leg: No edema.     Right foot: Normal range of motion.     Left foot: Normal range of motion. Deformity present.       Feet:  Lymphadenopathy:     Cervical: No cervical adenopathy.  Skin:    General: Skin is  warm.  Findings: No bruising, erythema, lesion or rash.  Neurological:     General: No focal deficit present.     Mental Status: She is alert and oriented to person, place, and time. Mental status is at baseline.     Cranial Nerves: No cranial nerve deficit.     Sensory: No sensory deficit.     Motor: No weakness.     Coordination: Coordination normal.     Gait: Gait normal.     Deep Tendon Reflexes: Reflexes normal.  Psychiatric:        Mood and Affect: Mood normal.        Behavior: Behavior normal.        Thought Content: Thought content normal.        Judgment: Judgment normal.       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  She is unable to extend her neck and her head essentially is locked in a downward facing position.  She has scoliosis in the back.     Assessment & Plan:  General medical exam - Plan: CBC with Differential/Platelet, Lipid panel, COMPLETE METABOLIC PANEL WITH GFR  Benign essential HTN - Plan: CBC with Differential/Platelet, Lipid panel, COMPLETE METABOLIC PANEL WITH GFR  PVD (peripheral vascular disease) (HCC) - Plan: CBC with Differential/Platelet, Lipid panel, COMPLETE METABOLIC PANEL WITH GFR  Pure hypercholesterolemia - Plan: CBC with Differential/Platelet, Lipid panel, COMPLETE METABOLIC PANEL WITH GFR  Postmenopausal estrogen deficiency - Plan: DG Bone Density Patient received her flu shot today.  I recommended RSV vaccine.  COVID booster is up-to-date.  Shingles shots up-to-date.  She had a pneumonia vaccine.  She is not due for colonoscopy or a Pap smear due to age.  Her mammogram is scheduled for next month.  I will schedule her for a bone density test.  Check CBC CMP and a lipid panel.  Given her history of peripheral vascular disease, I would like her LDL cholesterol to be below 70.  Her blood pressure today is excellent.  She denies any falls or depression or memory loss

## 2022-01-12 LAB — COMPLETE METABOLIC PANEL WITH GFR
AG Ratio: 1.8 (calc) (ref 1.0–2.5)
ALT: 14 U/L (ref 6–29)
AST: 26 U/L (ref 10–35)
Albumin: 4.6 g/dL (ref 3.6–5.1)
Alkaline phosphatase (APISO): 62 U/L (ref 37–153)
BUN: 12 mg/dL (ref 7–25)
CO2: 27 mmol/L (ref 20–32)
Calcium: 10.4 mg/dL (ref 8.6–10.4)
Chloride: 106 mmol/L (ref 98–110)
Creat: 0.82 mg/dL (ref 0.60–0.95)
Globulin: 2.6 g/dL (calc) (ref 1.9–3.7)
Glucose, Bld: 89 mg/dL (ref 65–99)
Potassium: 5.2 mmol/L (ref 3.5–5.3)
Sodium: 143 mmol/L (ref 135–146)
Total Bilirubin: 1.3 mg/dL — ABNORMAL HIGH (ref 0.2–1.2)
Total Protein: 7.2 g/dL (ref 6.1–8.1)
eGFR: 72 mL/min/{1.73_m2} (ref >=60)

## 2022-01-12 LAB — CBC WITH DIFFERENTIAL/PLATELET
HCT: 34.3 % — ABNORMAL LOW (ref 35.0–45.0)
Lymphs Abs: 1322 cells/uL (ref 850–3900)
MCH: 29.5 pg (ref 27.0–33.0)
MCHC: 31.8 g/dL — ABNORMAL LOW (ref 32.0–36.0)
Neutro Abs: 3909 cells/uL (ref 1500–7800)
Neutrophils Relative %: 67.4 %
RBC: 3.69 10*6/uL — ABNORMAL LOW (ref 3.80–5.10)

## 2022-01-12 LAB — LIPID PANEL
Cholesterol: 111 mg/dL (ref <200)
HDL: 71 mg/dL (ref >=50)
LDL Cholesterol (Calc): 26 mg/dL (calc)
Non-HDL Cholesterol (Calc): 40 mg/dL (calc) (ref <130)
Total CHOL/HDL Ratio: 1.6 (calc) (ref <5.0)
Triglycerides: 67 mg/dL (ref <150)

## 2022-01-31 DIAGNOSIS — Z1231 Encounter for screening mammogram for malignant neoplasm of breast: Secondary | ICD-10-CM | POA: Diagnosis not present

## 2022-01-31 LAB — HM MAMMOGRAPHY

## 2022-02-08 ENCOUNTER — Encounter: Payer: Self-pay | Admitting: Podiatry

## 2022-02-08 ENCOUNTER — Ambulatory Visit: Payer: Medicare HMO | Admitting: Podiatry

## 2022-02-08 DIAGNOSIS — E0842 Diabetes mellitus due to underlying condition with diabetic polyneuropathy: Secondary | ICD-10-CM

## 2022-02-08 DIAGNOSIS — M79609 Pain in unspecified limb: Secondary | ICD-10-CM

## 2022-02-08 DIAGNOSIS — S98132S Complete traumatic amputation of one left lesser toe, sequela: Secondary | ICD-10-CM

## 2022-02-08 DIAGNOSIS — B351 Tinea unguium: Secondary | ICD-10-CM | POA: Diagnosis not present

## 2022-02-08 NOTE — Progress Notes (Signed)
This patient returns to my office for at risk foot care.  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  This patient is unable to cut nails herself since the patient cannot reach her nails.These nails are painful walking and wearing shoes.   She is wearing a bandage on 4th toe right foot. This patient presents for at risk foot care today.  General Appearance  Alert, conversant and in no acute stress.  Vascular  Dorsalis pedis and posterior tibial  pulses are weakly  palpable  bilaterally.  Capillary return is within normal limits  bilaterally. Temperature is within normal limits  bilaterally.  Neurologic  Senn-Weinstein monofilament wire test within normal limits  bilaterally. Muscle power within normal limits bilaterally.  Nails Thick disfigured discolored nails with subungual debris hallux nails  bilaterally. No evidence of bacterial infection or drainage bilaterally.  Orthopedic  No limitations of motion  feet .  No crepitus or effusions noted.  No bony pathology or digital deformities noted. Amputation 3rd toe left foot.  Absent nail 4th right with masceration of fourth toe right foot.  Skin  normotropic skin with no porokeratosis noted bilaterally.  No signs of infections or ulcers noted.     Onychomycosis  Pain in right toes  Pain in left toes  Injured nail right foot.  Consent was obtained for treatment procedures.   Mechanical debridement of nails 1-5  bilaterally performed with a nail nipper.  Filed with dremel without incident. Told patient fourth toe is healing and she should peroxide nail bed and apply DSD.  To consider vascular studies in future after discussed with patient.   Return office visit      4 months                Told patient to return for periodic foot care and evaluation due to potential at risk complications.   Helane Gunther DPM

## 2022-02-22 DIAGNOSIS — H52223 Regular astigmatism, bilateral: Secondary | ICD-10-CM | POA: Diagnosis not present

## 2022-02-22 DIAGNOSIS — H524 Presbyopia: Secondary | ICD-10-CM | POA: Diagnosis not present

## 2022-02-22 DIAGNOSIS — H5203 Hypermetropia, bilateral: Secondary | ICD-10-CM | POA: Diagnosis not present

## 2022-02-22 DIAGNOSIS — Z135 Encounter for screening for eye and ear disorders: Secondary | ICD-10-CM | POA: Diagnosis not present

## 2022-02-22 DIAGNOSIS — H2513 Age-related nuclear cataract, bilateral: Secondary | ICD-10-CM | POA: Diagnosis not present

## 2022-03-11 ENCOUNTER — Other Ambulatory Visit: Payer: Self-pay | Admitting: Family Medicine

## 2022-03-15 ENCOUNTER — Other Ambulatory Visit: Payer: Self-pay | Admitting: Family Medicine

## 2022-03-15 DIAGNOSIS — E7800 Pure hypercholesterolemia, unspecified: Secondary | ICD-10-CM

## 2022-03-15 NOTE — Telephone Encounter (Signed)
Requested Prescriptions  Pending Prescriptions Disp Refills   rosuvastatin (CRESTOR) 20 MG tablet [Pharmacy Med Name: ROSUVASTATIN CALCIUM 20 MG Tablet] 90 tablet 3    Sig: TAKE 1 TABLET EVERY DAY     Cardiovascular:  Antilipid - Statins 2 Failed - 03/15/2022  5:00 PM      Failed - Valid encounter within last 12 months    Recent Outpatient Visits           1 year ago General medical exam   [facility] Donita Brooks, MD   2 years ago Need for immunization against influenza   [facility] Donita Brooks, MD   2 years ago Benign essential HTN   [facility] Tanya Nones, Priscille Heidelberg, MD   4 years ago PVD (peripheral vascular disease) (HCC)   [facility] Pickard, Priscille Heidelberg, MD   5 years ago Wet gangrene [facility])   [facility] Donita Brooks, MD              Failed - Lipid Panel in normal range within the last 12 months    Cholesterol  Date Value Ref Range Status  01/11/2022 111 <200 mg/dL Final   LDL Cholesterol (Calc)  Date Value Ref Range Status  01/11/2022 26 mg/dL (calc) Final    Comment:    Reference range: <100 . Desirable range <100 mg/dL for primary prevention;   <70 mg/dL for patients with CHD or diabetic patients  with > or = 2 CHD risk factors. Marland Kitchen LDL-C is now calculated using the Martin-Hopkins  calculation, which is a validated novel method providing  better accuracy than the Friedewald equation in the  estimation of LDL-C.  Horald Pollen et al. Lenox Ahr. 9833;825(05): 2061-2068  (http://education.[facility].com/faq/FAQ164)    HDL  Date Value Ref Range Status  01/11/2022 71 > OR = 50 mg/dL Final   Triglycerides  Date Value Ref Range Status  01/11/2022 67 <150 mg/dL Final         Passed - Cr in normal range and within 360 days    Creat  Date Value Ref Range Status  01/11/2022 0.82 0.60 - 0.95 mg/dL Final         Passed - Patient is not pregnant

## 2022-04-18 ENCOUNTER — Encounter: Payer: Self-pay | Admitting: Family Medicine

## 2022-06-14 ENCOUNTER — Ambulatory Visit: Payer: Medicare HMO | Admitting: Podiatry

## 2022-06-21 ENCOUNTER — Ambulatory Visit: Payer: Medicare HMO | Admitting: Podiatry

## 2022-06-21 ENCOUNTER — Encounter: Payer: Self-pay | Admitting: Podiatry

## 2022-06-21 DIAGNOSIS — E0842 Diabetes mellitus due to underlying condition with diabetic polyneuropathy: Secondary | ICD-10-CM | POA: Diagnosis not present

## 2022-06-21 DIAGNOSIS — M79609 Pain in unspecified limb: Secondary | ICD-10-CM | POA: Diagnosis not present

## 2022-06-21 DIAGNOSIS — B351 Tinea unguium: Secondary | ICD-10-CM | POA: Diagnosis not present

## 2022-06-21 DIAGNOSIS — S98132S Complete traumatic amputation of one left lesser toe, sequela: Secondary | ICD-10-CM

## 2022-06-21 NOTE — Progress Notes (Signed)
This patient returns to my office for at risk foot care.  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  This patient is unable to cut nails herself since the patient cannot reach her nails.These nails are painful walking and wearing shoes.   She is wearing a bandage on 4th toe right foot. Patient says she has bleeding from big toenail right foot. And she is wearing a band-aid.This patient presents for at risk foot care today.  General Appearance  Alert, conversant and in no acute stress.  Vascular  Dorsalis pedis and posterior tibial  pulses are weakly  palpable  bilaterally.  Capillary return is within normal limits  bilaterally. Temperature is within normal limits  bilaterally.  Neurologic  Senn-Weinstein monofilament wire test within normal limits  bilaterally. Muscle power within normal limits bilaterally.  Nails Thick disfigured discolored nails with subungual debris hallux nails  bilaterally. No evidence of bacterial infection or drainage bilaterally.  Nail dystrophy right hallux toenail.  Orthopedic  No limitations of motion  feet .  No crepitus or effusions noted.  No bony pathology or digital deformities noted. Amputation 3rd toe left foot.   Skin  normotropic skin with no porokeratosis noted bilaterally.  No signs of infections or ulcers noted.     Onychomycosis  Pain in right toes  Pain in left toes  Injured nail right foot.  Consent was obtained for treatment procedures.   Mechanical debridement of nails 1-5  bilaterally performed with a nail nipper.  Filed with dremel without incident. Neosporin/DSD right hallux bandage.   Return office visit      3 months                Told patient to return for periodic foot care and evaluation due to potential at risk complications.   Gardiner Barefoot DPM

## 2022-07-08 ENCOUNTER — Inpatient Hospital Stay: Admission: RE | Admit: 2022-07-08 | Payer: Medicare HMO | Source: Ambulatory Visit

## 2022-07-16 ENCOUNTER — Other Ambulatory Visit: Payer: Self-pay | Admitting: Family Medicine

## 2022-07-16 DIAGNOSIS — Z780 Asymptomatic menopausal state: Secondary | ICD-10-CM

## 2022-09-11 ENCOUNTER — Other Ambulatory Visit: Payer: Self-pay | Admitting: Family Medicine

## 2022-10-21 ENCOUNTER — Ambulatory Visit: Payer: Medicare HMO | Admitting: Podiatry

## 2022-10-21 ENCOUNTER — Encounter: Payer: Self-pay | Admitting: Podiatry

## 2022-10-21 DIAGNOSIS — B351 Tinea unguium: Secondary | ICD-10-CM

## 2022-10-21 DIAGNOSIS — S98132S Complete traumatic amputation of one left lesser toe, sequela: Secondary | ICD-10-CM

## 2022-10-21 DIAGNOSIS — E0842 Diabetes mellitus due to underlying condition with diabetic polyneuropathy: Secondary | ICD-10-CM

## 2022-10-21 NOTE — Progress Notes (Signed)
This patient returns to my office for at risk foot care.  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  This patient is unable to cut nails herself since the patient cannot reach her nails.These nails are painful walking and wearing shoes.   This patient presents for at risk foot care today.  General Appearance  Alert, conversant and in no acute stress.  Vascular  Dorsalis pedis and posterior tibial  pulses are weakly  palpable  bilaterally.  Capillary return is within normal limits  bilaterally. Temperature is within normal limits  bilaterally.  Neurologic  Senn-Weinstein monofilament wire test within normal limits  bilaterally. Muscle power within normal limits bilaterally.  Nails Thick disfigured discolored nails with subungual debris hallux nails  bilaterally. No evidence of bacterial infection or drainage bilaterally.  Nail dystrophy right hallux toenail.  Orthopedic  No limitations of motion  feet .  No crepitus or effusions noted.  No bony pathology or digital deformities noted. Amputation 3rd toe left foot.   Skin  normotropic skin with no porokeratosis noted bilaterally.  No signs of infections or ulcers noted.     Onychomycosis  Pain in right toes  Pain in left toes  Injured nail right foot.  Consent was obtained for treatment procedures.   Mechanical debridement of nails 1-5  bilaterally performed with a nail nipper.  Filed with dremel without incident.   Return office visit      3 months                Told patient to return for periodic foot care and evaluation due to potential at risk complications.   Helane Gunther DPM

## 2023-01-16 ENCOUNTER — Ambulatory Visit: Payer: Medicare HMO

## 2023-01-16 VITALS — Ht 62.0 in | Wt 129.0 lb

## 2023-01-16 DIAGNOSIS — Z0000 Encounter for general adult medical examination without abnormal findings: Secondary | ICD-10-CM | POA: Diagnosis not present

## 2023-01-16 NOTE — Patient Instructions (Addendum)
Ann Powell , Thank you for taking time to come for your Medicare Wellness Visit. I appreciate your ongoing commitment to your health goals. Please review the following plan we discussed and let me know if I can assist you in the future.   Referrals/Orders/Follow-Ups/Clinician Recommendations: Aim for 30 minutes of exercise or brisk walking, 6-8 glasses of water, and 5 servings of fruits and vegetables each day.  You have an order for:  []   2D Mammogram  []   3D Mammogram  [x]   Bone Density     Please call for appointment:   [facility] [address] [place], [place] [address] [phone]  Make sure to wear two-piece clothing.  No lotions, powders, or deodorants the day of the appointment. Make sure to bring picture ID and insurance card.  Bring list of medications you are currently taking including any supplements.   Schedule your [place] screening mammogram through MyChart!   Log into your MyChart account.  Go to 'Visit' (or 'Appointments' if on mobile App) --> Schedule an Appointment  Under 'Select a Reason for Visit' choose the Mammogram Screening option.  Complete the pre-visit questions and select the time and place that best fits your schedule.    This is a list of the screening recommended for you and due dates:  Health Maintenance  Topic Date Due   Yearly kidney health urinalysis for diabetes  Never done   Zoster (Shingles) Vaccine (2 of 2) 03/19/2021   Yearly kidney function blood test for diabetes  01/12/2023   Mammogram  02/01/2023   Complete foot exam   02/09/2023   COVID-19 Vaccine (5 - 2023-24 season) 04/14/2023   Medicare Annual Wellness Visit  01/16/2024   DTaP/Tdap/Td vaccine (2 - Td or Tdap) 12/12/2032   Pneumonia Vaccine  Completed   Flu Shot  Completed   DEXA scan (bone density measurement)  Completed   HPV Vaccine  Aged Out   Hemoglobin A1C  Discontinued   Eye exam for diabetics  Discontinued    Advanced  directives: (ACP Link)Information on Advanced Care Planning can be found at [facility] Advance Health Care Directives Advance Health Care Directives ([email or web address]   Next Medicare Annual Wellness Visit scheduled for next year: Yes

## 2023-01-16 NOTE — Progress Notes (Signed)
Subjective:   Ann Powell is a 82 y.o. female who presents for Medicare Annual (Subsequent) preventive examination.  Visit Complete: Virtual I connected with  [name] on 01/16/23 by a audio enabled telemedicine application and verified that I am speaking with the correct person using two identifiers.  Patient Location: Home  Provider Location: Home Office  I discussed the limitations of evaluation and management by telemedicine. The patient expressed understanding and agreed to proceed.  Vital Signs: Because this visit was a virtual/telehealth visit, some criteria may be missing or patient reported. Any vitals not documented were not able to be obtained and vitals that have been documented are patient reported.  Cardiac Risk Factors include: advanced age (>74men, >51 women);hypertension     Objective:    Today's Vitals   01/16/23 1035  Weight: 129 lb (58.5 kg)  Height: 5\' 2"  (1.575 m)   Body mass index is 23.59 kg/m.     01/16/2023    1:27 PM 11/19/2019    9:30 AM 09/02/2016    6:30 AM  Advanced Directives  Does Patient Have a Medical Advance Directive? No No No  Would patient like information on creating a medical advance directive? Yes (MAU/Ambulatory/Procedural Areas - Information given) Yes (MAU/Ambulatory/Procedural Areas - Information given) No - Patient declined    Current Medications (verified) Outpatient Encounter Medications as of 01/16/2023  Medication Sig   aspirin 325 MG tablet Take 325 mg by mouth.   carbonyl iron (FEOSOL) 45 MG TABS tablet Take 45 mg by mouth.   cholecalciferol (VITAMIN D) 1000 units tablet Take 1,000 Units by mouth daily.   clopidogrel (PLAVIX) 75 MG tablet TAKE 1 TABLET (75 MG TOTAL) BY MOUTH DAILY.   co-enzyme Q-10 30 MG capsule Take 30 mg by mouth daily.    losartan (COZAAR) 50 MG tablet TAKE 1 TABLET IN THE MORNING AND AT BEDTIME.   magnesium gluconate (MAGONATE) 500 MG tablet Take 500 mg by mouth daily.    rosuvastatin (CRESTOR) 20 MG tablet TAKE 1 TABLET EVERY DAY   UNABLE TO FIND Take by mouth.   vitamin B-12 (CYANOCOBALAMIN) 1000 MCG tablet Take 1,000 mcg by mouth daily.   No facility-administered encounter medications on file as of 01/16/2023.    Allergies (verified) Patient has no known allergies.   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 Logan Bores) due to chronic osteomyelitis and ischemia.    TONSILLECTOMY     History reviewed. No pertinent family history. Social History   Socioeconomic History   Marital status: Single    Spouse name: Not on file   Number of children: Not on file   Years of education: Not on file   Highest education level: Not on file  Occupational History   Not on file  Tobacco Use   Smoking status: Never   Smokeless tobacco: Never  Vaping Use   Vaping status: Never Used  Substance and Sexual Activity   Alcohol use: No   Drug use: No  Sexual activity: Never  Other Topics Concern   Not on file  Social History Narrative   Not on file   Social Determinants of Health   Financial Resource Strain: Low Risk  (01/16/2023)   Overall Financial Resource Strain (CARDIA)    Difficulty of Paying Living Expenses: Not hard at all  Food Insecurity: No Food Insecurity (01/16/2023)   Hunger Vital Sign    Worried About Running Out of Food in the Last Year: Never true    Ran Out of Food in the Last Year: Never true  Transportation Needs: No Transportation Needs (01/16/2023)   PRAPARE - Scientist, research (physical sciences) (Medical): No    Lack of Transportation (Non-Medical): No  Physical Activity: Insufficiently Active (01/16/2023)   Exercise Vital Sign    Days of Exercise per Week: 3 days    Minutes of Exercise per Session: 30 min  Stress: No Stress Concern Present (01/16/2023)   [employer] of Occupational Health - Occupational Stress Questionnaire    Feeling of Stress : Not at all  Social Connections: Moderately Isolated (01/16/2023)   Social Connection and Isolation Panel [NHANES]    Frequency of Communication with Friends and Family: More than three times a week    Frequency of Social Gatherings with Friends and Family: Three times a week    Attends Religious Services: More than 4 times per year    Active Member of Clubs or Organizations: No    Attends Banker Meetings: Never    Marital Status: Never married    Tobacco Counseling Counseling given: Not Answered   Clinical Intake:  Pre-visit preparation completed: Yes  Pain : No/denies pain     Diabetes: No  How often do you need to have someone help you when you read instructions, pamphlets, or other written materials from your doctor or pharmacy?: 1 - Never  Interpreter Needed?: No  Information entered by :: Kandis Fantasia LPN   Activities of Daily Living    01/16/2023    1:26 PM  In your present state of health, do you have any difficulty performing the following activities:  Hearing? 0  Vision? 0  Difficulty concentrating or making decisions? 0  Walking or climbing stairs? 0  Dressing or bathing? 0  Doing errands, shopping? 0  Preparing Food and eating ? N  Using the Toilet? N  In the past six months, have you accidently leaked urine? N  Do you have problems with loss of bowel control? N  Managing your Medications? N  Managing your Finances? N  Housekeeping or managing your Housekeeping? N    Patient Care Team: Donita Brooks, MD as PCP - General (Family Medicine) Mammography,  [facility] (Diagnostic Radiology) Helane Gunther, DPM as Consulting Physician (Podiatry)  Indicate any recent Medical Services you may have received from other than [facility] providers in the past year (date may be approximate).     Assessment:   This is a routine wellness examination for Johni.  Hearing/Vision screen Hearing Screening - Comments:: Denies hearing difficulties   Vision Screening - Comments:: Wears rx glasses - up to date with routine eye exams     Goals Addressed             This Visit's Progress    Remain active and independent        Depression Screen    01/16/2023    1:24 PM 11/19/2019    9:25 AM 11/11/2017   10:55 AM 11/11/2017  10:15 AM  PHQ 2/9 Scores  PHQ - 2 Score 0 0 0 0    Fall Risk    01/16/2023    1:26 PM 11/19/2019    9:24 AM 11/11/2017   10:55 AM 11/11/2017   10:15 AM  Fall Risk   Falls in the past year? 0 0 Yes Yes  Number falls in past yr: 0  1 1  Injury with Fall? 0  No No  Risk for fall due to : No Fall Risks No Fall Risks    Follow up Falls prevention discussed;Education provided;Falls evaluation completed Falls evaluation completed  Falls evaluation completed    MEDICARE RISK AT HOME: Medicare Risk at Home Any stairs in or around the home?: No If so, are there any without handrails?: No Home free of loose throw rugs in walkways, pet beds, electrical cords, etc?: Yes Adequate lighting in your home to reduce risk of falls?: Yes Life alert?: No Use of a cane, walker or w/c?: No Grab bars in the bathroom?: Yes Shower chair or bench in shower?: No Elevated toilet seat or a handicapped toilet?: Yes  TIMED UP AND GO:  Was the test performed?  No    Cognitive Function:        01/16/2023    1:27 PM  6CIT Screen  What Year? 0 points  What month? 0 points  What time? 0 points  Count back from 20 0 points  Months in reverse 0 points  Repeat phrase 0 points  Total Score 0 points    Immunizations Immunization History   Administered Date(s) Administered   Fluad Quad(high Dose 65+) 01/14/2019, 01/20/2020, 01/25/2021   Influenza, High Dose Seasonal PF 01/17/2017   Influenza,inj,Quad PF,6+ Mos 12/28/2013, 02/13/2015, 01/26/2016, 12/12/2017   Influenza-Unspecified 12/13/2022   PFIZER(Purple Top)SARS-COV-2 Vaccination 09/17/2019, 10/08/2019   Pfizer Covid-19 Vaccine Bivalent Booster 51yrs & up 11/20/2020   Pfizer(Comirnaty)Fall Seasonal Vaccine 12 years and older 12/13/2022   Pneumococcal Conjugate-13 10/17/2014   Pneumococcal Polysaccharide-23 10/25/2013   Tdap 12/13/2022   Zoster Recombinant(Shingrix) 01/22/2021   Zoster, Live 10/19/2014    TDAP status: Up to date  Flu Vaccine status: Up to date  Pneumococcal vaccine status: Up to date  Covid-19 vaccine status: Completed vaccines  Qualifies for Shingles Vaccine? Yes   Zostavax completed Yes   Shingrix Completed?: No.    Education has been provided regarding the importance of this vaccine. Patient has been advised to call insurance company to determine out of pocket expense if they have not yet received this vaccine. Advised may also receive vaccine at local pharmacy or Health Dept. Verbalized acceptance and understanding.  Screening Tests Health Maintenance  Topic Date Due   Diabetic kidney evaluation - Urine ACR  Never done   Zoster Vaccines- Shingrix (2 of 2) 03/19/2021   Diabetic kidney evaluation - eGFR measurement  01/12/2023   MAMMOGRAM  02/01/2023   FOOT EXAM  02/09/2023   COVID-19 Vaccine (5 - 2023-24 season) 04/14/2023   Medicare Annual Wellness (AWV)  01/16/2024   DTaP/Tdap/Td (2 - Td or Tdap) 12/12/2032   Pneumonia Vaccine 31+ Years old  Completed   INFLUENZA VACCINE  Completed   DEXA SCAN  Completed   HPV VACCINES  Aged Out   HEMOGLOBIN A1C  Discontinued   OPHTHALMOLOGY EXAM  Discontinued    Health Maintenance  Health Maintenance Due  Topic Date Due   Diabetic kidney evaluation - Urine ACR  Never done   Zoster Vaccines-  Shingrix (2 of 2) 03/19/2021  Diabetic kidney evaluation - eGFR measurement  01/12/2023    Colorectal cancer screening: No longer required.   Mammogram status: Completed 01/31/22. Repeat every year  Bone Density status: Ordered and scheduled for 01/24/23. Pt provided with contact info and advised to call to schedule appt.  Lung Cancer Screening: (Low Dose CT Chest recommended if Age 40-80 years, 20 pack-year currently smoking OR have quit w/in 15years.) does not qualify.   Lung Cancer Screening Referral: n/a  Additional Screening:  Hepatitis C Screening: does not qualify  Vision Screening: Recommended annual ophthalmology exams for early detection of glaucoma and other disorders of the eye. Is the patient up to date with their annual eye exam?  Yes  Who is the provider or what is the name of the office in which the patient attends annual eye exams? Unable to provide name If pt is not established with a provider, would they like to be referred to a provider to establish care? No .   Dental Screening: Recommended annual dental exams for proper oral hygiene  Community Resource Referral / Chronic Care Management: CRR required this visit?  No   CCM required this visit?  No     Plan:     I have personally reviewed and noted the following in the patient's chart:   Medical and social history Use of alcohol, tobacco or illicit drugs  Current medications and supplements including opioid prescriptions. Patient is not currently taking opioid prescriptions. Functional ability and status Nutritional status Physical activity Advanced directives List of other physicians Hospitalizations, surgeries, and ER visits in previous 12 months Vitals Screenings to include cognitive, depression, and falls Referrals and appointments  In addition, I have reviewed and discussed with patient certain preventive protocols, quality metrics, and best practice recommendations. A written personalized  care plan for preventive services as well as general preventive health recommendations were provided to patient.     Kandis Fantasia [place], [place]   60/63/0160   After Visit Summary: (Mail) Due to this being a telephonic visit, the after visit summary with patients personalized plan was offered to patient via mail   Nurse Notes: No concerns at this time

## 2023-01-22 ENCOUNTER — Ambulatory Visit: Payer: Medicare HMO | Admitting: Podiatry

## 2023-01-22 ENCOUNTER — Encounter: Payer: Self-pay | Admitting: Podiatry

## 2023-01-22 VITALS — Ht 62.0 in | Wt 129.0 lb

## 2023-01-22 DIAGNOSIS — S98132S Complete traumatic amputation of one left lesser toe, sequela: Secondary | ICD-10-CM | POA: Diagnosis not present

## 2023-01-22 DIAGNOSIS — M79609 Pain in unspecified limb: Secondary | ICD-10-CM | POA: Diagnosis not present

## 2023-01-22 DIAGNOSIS — E0842 Diabetes mellitus due to underlying condition with diabetic polyneuropathy: Secondary | ICD-10-CM

## 2023-01-22 DIAGNOSIS — B351 Tinea unguium: Secondary | ICD-10-CM

## 2023-01-22 NOTE — Progress Notes (Signed)
This patient returns to my office for at risk foot care.  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  This patient is unable to cut nails herself since the patient cannot reach her nails.These nails are painful walking and wearing shoes.   This patient presents for at risk foot care today.  General Appearance  Alert, conversant and in no acute stress.  Vascular  Dorsalis pedis and posterior tibial  pulses are weakly  palpable  bilaterally.  Capillary return is within normal limits  bilaterally. Temperature is within normal limits  bilaterally.  Neurologic  Senn-Weinstein monofilament wire test within normal limits  bilaterally. Muscle power within normal limits bilaterally.  Nails Thick disfigured discolored nails with subungual debris hallux nails  bilaterally. No evidence of bacterial infection or drainage bilaterally.  Nail dystrophy right hallux toenail.  Orthopedic  No limitations of motion  feet .  No crepitus or effusions noted.  No bony pathology or digital deformities noted. Amputation 3rd toe left foot.   Skin  normotropic skin with no porokeratosis noted bilaterally.  No signs of infections or ulcers noted.     Onychomycosis  Pain in right toes  Pain in left toes    Consent was obtained for treatment procedures.   Mechanical debridement of nails 1-5  right and 1-2 and 4,5 left foot.performed with a nail nipper.  Filed with dremel without incident.   Return office visit      3 months                Told patient to return for periodic foot care and evaluation due to potential at risk complications.   Helane Gunther DPM

## 2023-01-24 ENCOUNTER — Other Ambulatory Visit: Payer: Medicare HMO

## 2023-01-30 DIAGNOSIS — Z0101 Encounter for examination of eyes and vision with abnormal findings: Secondary | ICD-10-CM | POA: Diagnosis not present

## 2023-01-31 ENCOUNTER — Other Ambulatory Visit: Payer: Self-pay | Admitting: Family Medicine

## 2023-01-31 NOTE — Telephone Encounter (Signed)
Requested medication (s) are due for refill today: yes  Requested medication (s) are on the active medication list: yes  Last refill:  11/27/21  Future visit scheduled: yes  Notes to clinic:  Unable to refill per protocol due to failed labs, no updated results.      Requested Prescriptions  Pending Prescriptions Disp Refills   clopidogrel (PLAVIX) 75 MG tablet [Pharmacy Med Name: Clopidogrel Bisulfate Oral Tablet 75 MG] 90 tablet 3    Sig: TAKE 1 TABLET EVERY DAY     Hematology: Antiplatelets - clopidogrel Failed - 01/31/2023 12:38 PM      Failed - HCT in normal range and within 180 days    HCT  Date Value Ref Range Status  01/11/2022 34.3 (L) 35.0 - 45.0 % Final   Hematocrit  Date Value Ref Range Status  08/22/2016 34.9 34.0 - 46.6 % Final         Failed - HGB in normal range and within 180 days    Hemoglobin  Date Value Ref Range Status  01/11/2022 10.9 (L) 11.7 - 15.5 g/dL Final  32/44/0102 72.5 11.1 - 15.9 g/dL Final         Failed - PLT in normal range and within 180 days    Platelets  Date Value Ref Range Status  01/11/2022 392 140 - 400 Thousand/uL Final  08/22/2016 544 (H) 150 - 379 x10E3/uL Final         Failed - Cr in normal range and within 360 days    Creat  Date Value Ref Range Status  01/11/2022 0.82 0.60 - 0.95 mg/dL Final         Failed - Valid encounter within last 6 months    Recent Outpatient Visits           2 years ago General medical exam   [facility] Donita Brooks, MD   3 years ago Need for immunization against influenza   [facility] Donita Brooks, MD   3 years ago Benign essential HTN   [facility] Tanya Nones, Priscille Heidelberg, MD   5 years ago PVD (peripheral vascular disease) (HCC)   [facility] Donita Brooks, MD   6 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

## 2023-02-06 DIAGNOSIS — Z1231 Encounter for screening mammogram for malignant neoplasm of breast: Secondary | ICD-10-CM | POA: Diagnosis not present

## 2023-02-06 LAB — HM MAMMOGRAPHY

## 2023-02-14 ENCOUNTER — Other Ambulatory Visit: Payer: Self-pay | Admitting: Family Medicine

## 2023-02-14 DIAGNOSIS — E7800 Pure hypercholesterolemia, unspecified: Secondary | ICD-10-CM

## 2023-02-14 NOTE — Telephone Encounter (Signed)
Requested Prescriptions  Pending Prescriptions Disp Refills   rosuvastatin (CRESTOR) 20 MG tablet [Pharmacy Med Name: Rosuvastatin Calcium Oral Tablet 20 MG] 90 tablet 0    Sig: TAKE 1 TABLET EVERY DAY     Cardiovascular:  Antilipid - Statins 2 Failed - 02/14/2023 10:25 AM      Failed - Cr in normal range and within 360 days    Creat  Date Value Ref Range Status  01/11/2022 0.82 0.60 - 0.95 mg/dL Final         Failed - Valid encounter within last 12 months    Recent Outpatient Visits           2 years ago General medical exam   [facility] Donita Brooks, MD   3 years ago Need for immunization against influenza   [facility] Donita Brooks, MD   3 years ago Benign essential HTN   [facility] Tanya Nones, Priscille Heidelberg, MD   5 years ago PVD (peripheral vascular disease) (HCC)   [facility] Pickard, Priscille Heidelberg, MD   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 Desirable range <100 mg/dL for primary prevention;   <70 mg/dL for patients with CHD or diabetic patients  with > or = 2 CHD risk factors. Marland Kitchen LDL-C is now calculated using the Martin-Hopkins  calculation, which is a validated novel method providing  better accuracy than the Friedewald equation in the  estimation of LDL-C.  Horald Pollen et al. Lenox Ahr. 4098;119(14): 2061-2068  (http://education.[employer].com/faq/FAQ164)    HDL  Date Value Ref Range Status  01/11/2022 71 > OR = 50 mg/dL Final   Triglycerides   Date Value Ref Range Status  01/11/2022 67 <150 mg/dL Final         Passed - Patient is not pregnant       losartan (COZAAR) 50 MG tablet [Pharmacy Med Name: Losartan Potassium Oral Tablet 50 MG] 180 tablet 0    Sig: TAKE 1 TABLET IN THE MORNING AND AT BEDTIME.     Cardiovascular:  Angiotensin Receptor Blockers Failed - 02/14/2023 10:25 AM      Failed - Cr in normal range and within 180 days    Creat  Date Value Ref Range Status  01/11/2022 0.82 0.60 - 0.95 mg/dL Final         Failed - K in normal range and within 180 days    Potassium  Date Value Ref Range Status  01/11/2022 5.2 3.5 - 5.3 mmol/L Final         Failed - Valid encounter within last 6 months    Recent Outpatient Visits           2 years ago General medical exam   [facility] Donita Brooks, MD   3 years ago Need for immunization against  influenza   [facility] Tanya Nones, Priscille Heidelberg, MD   3 years ago Benign essential HTN   [facility] Tanya Nones, Priscille Heidelberg, MD   5 years ago PVD (peripheral vascular disease) [facility])   [facility] Tanya Nones, Priscille Heidelberg, MD   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

## 2023-03-04 ENCOUNTER — Ambulatory Visit (INDEPENDENT_AMBULATORY_CARE_PROVIDER_SITE_OTHER): Payer: Medicare HMO | Admitting: Family Medicine

## 2023-03-04 VITALS — BP 124/76 | HR 80 | Temp 97.8°F | Ht 62.0 in | Wt 118.4 lb

## 2023-03-04 DIAGNOSIS — Z0000 Encounter for general adult medical examination without abnormal findings: Secondary | ICD-10-CM

## 2023-03-04 DIAGNOSIS — E7800 Pure hypercholesterolemia, unspecified: Secondary | ICD-10-CM

## 2023-03-04 DIAGNOSIS — Z0001 Encounter for general adult medical examination with abnormal findings: Secondary | ICD-10-CM | POA: Diagnosis not present

## 2023-03-04 DIAGNOSIS — D649 Anemia, unspecified: Secondary | ICD-10-CM | POA: Diagnosis not present

## 2023-03-04 DIAGNOSIS — Z780 Asymptomatic menopausal state: Secondary | ICD-10-CM

## 2023-03-04 DIAGNOSIS — I739 Peripheral vascular disease, unspecified: Secondary | ICD-10-CM

## 2023-03-04 DIAGNOSIS — I10 Essential (primary) hypertension: Secondary | ICD-10-CM

## 2023-03-04 NOTE — Progress Notes (Signed)
Subjective:    Patient ID: Ann Powell, female    DOB: 01-Aug-1940, 82 y.o.   MRN: [id number]  HPI Patient is a 82 y/o AAF here today for a wellness exam.  She denies any falls.  She denies any depression.  She denies any memory loss.  Past medical history is significant for peripheral vascular disease.  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  She has a history of peripheral stents.  Patient had a mammogram performed earlier this year that was normal.  She brought a copy of the report.  They did mention fibroglandular tissue but recommended rechecking in 1 year.  She is due for a bone density test.  Her last bone density test was 5 years ago.  Due to her age she does not require colonoscopy or Pap smear.  She has had her flu shot, her COVID shot, her shingles vaccine, her pneumonia vaccine.  Regularly follows up with her vascular doctor.  Examination of her feet today reveals a superficial scrape on the skin on the top of her left foot.  She states that she dropped a piece of firewood on this.  However it is healing well with no evidence of cellulitis. Past Medical History:  Diagnosis Date   History of gastric ulcer    "after hysterectomy"   Hypertension    PVD (peripheral vascular disease) (HCC)    Ulcer    Past Surgical History:  Procedure Laterality Date   ABDOMINAL HYSTERECTOMY     LOWER EXTREMITY INTERVENTION N/A 09/02/2016   Procedure: Lower Extremity Intervention;  Surgeon: Runell Gess, MD;  Location: [facility] INVASIVE CV LAB;  Service: Cardiovascular;  Laterality: N/A;   TOE AMPUTATION     left 3rd distal to MTP (Dr. Logan Bores) due to chronic osteomyelitis and ischemia.    TONSILLECTOMY     Current Outpatient Medications on File Prior to Visit  Medication Sig Dispense Refill   aspirin 325 MG tablet Take 325 mg by mouth.     carbonyl iron (FEOSOL) 45 MG TABS tablet Take 45 mg by  mouth.     cholecalciferol (VITAMIN D) 1000 units tablet Take 1,000 Units by mouth daily.     clopidogrel (PLAVIX) 75 MG tablet TAKE 1 TABLET (75 MG TOTAL) BY MOUTH DAILY. 90 tablet 3   co-enzyme Q-10 30 MG capsule Take 30 mg by mouth daily.      losartan (COZAAR) 50 MG tablet TAKE 1 TABLET IN THE MORNING AND AT BEDTIME. 180 tablet 0   magnesium gluconate (MAGONATE) 500 MG tablet Take 500 mg by mouth daily.     rosuvastatin (CRESTOR) 20 MG tablet TAKE 1 TABLET EVERY DAY 90 tablet 0   UNABLE TO FIND Take by mouth.     vitamin B-12 (CYANOCOBALAMIN) 1000 MCG tablet Take 1,000 mcg by mouth daily.     No current facility-administered medications on file prior to visit.   No Known Allergies Social History   Socioeconomic History   Marital status: Single    Spouse name: Not on file   Number of children: Not on file   Years of education: Not on file   Highest education level: Not on file  Occupational History   Not on file  Tobacco Use   Smoking status: Never   Smokeless tobacco: Never  Vaping Use   Vaping status: Never Used  Substance and Sexual  Activity   Alcohol use: No   Drug use: No   Sexual activity: Never  Other Topics Concern   Not on file  Social History Narrative   Not on file   Social Determinants of Health   Financial Resource Strain: Low Risk  (01/16/2023)   Overall Financial Resource Strain (CARDIA)    Difficulty of Paying Living Expenses: Not hard at all  Food Insecurity: No Food Insecurity (01/16/2023)   Hunger Vital Sign    Worried About Running Out of Food in the Last Year: Never true    Ran Out of Food in the Last Year: Never true  Transportation Needs: No Transportation Needs (01/16/2023)   PRAPARE - Administrator, Civil Service (Medical): No    Lack of Transportation (Non-Medical): No  Physical Activity: Insufficiently Active (01/16/2023)   Exercise Vital Sign    Days of Exercise per Week: 3 days    Minutes of Exercise per Session: 30 min   Stress: No Stress Concern Present (01/16/2023)   [employer] of Occupational Health - Occupational Stress Questionnaire    Feeling of Stress : Not at all  Social Connections: Moderately Isolated (01/16/2023)   Social Connection and Isolation Panel [NHANES]    Frequency of Communication with Friends and Family: More than three times a week    Frequency of Social Gatherings with Friends and Family: Three times a week    Attends Religious Services: More than 4 times per year    Active Member of Clubs or Organizations: No    Attends Banker Meetings: Never    Marital Status: Never married  Intimate Partner Violence: Not At Risk (01/16/2023)   Humiliation, Afraid, Rape, and Kick questionnaire    Fear of Current or Ex-Partner: No    Emotionally Abused: No    Physically Abused: No    Sexually Abused: No      Review of Systems  All other systems reviewed and are negative.      Objective:   Physical Exam Vitals reviewed.  Constitutional:      General: She is not in acute distress.    Appearance: Normal appearance. She is well-developed and normal weight. She is not ill-appearing, toxic-appearing or diaphoretic.  HENT:     Head: Normocephalic and atraumatic.     Right Ear: Tympanic membrane, ear canal and external ear normal. There is no impacted cerumen.     Left Ear: Tympanic membrane, ear canal and external ear normal. There is no impacted cerumen.     Nose: Nose normal. No congestion or rhinorrhea.     Mouth/Throat:     Mouth: Mucous membranes are moist.     Pharynx: No oropharyngeal exudate or posterior oropharyngeal erythema.  Eyes:     General: No scleral icterus.       Right eye: No discharge.        Left eye: No discharge.     Extraocular Movements: Extraocular movements intact.     Conjunctiva/sclera: Conjunctivae normal.     Pupils: Pupils are equal, round, and reactive to light.  Neck:     Vascular: No carotid bruit or JVD.  Cardiovascular:      Rate and Rhythm: Normal rate and regular rhythm.     Pulses:          Dorsalis pedis pulses are 1+ on the right side and 1+ on the left side.       Posterior tibial pulses are 1+ on the right side and  1+ on the left side.     Heart sounds: Normal heart sounds. No murmur heard. Pulmonary:     Effort: Pulmonary effort is normal. No respiratory distress.     Breath sounds: Normal breath sounds. No stridor. No wheezing, rhonchi or rales.  Chest:     Chest wall: No tenderness.  Abdominal:     General: Bowel sounds are normal. There is no distension.     Palpations: Abdomen is soft.     Tenderness: There is no abdominal tenderness. There is no guarding or rebound.  Musculoskeletal:        General: Deformity present. No swelling or signs of injury.     Cervical back: Rigidity present. Decreased range of motion.     Thoracic back: Scoliosis present.     Right lower leg: No edema.     Left lower leg: No edema.     Right foot: Normal range of motion.     Left foot: Normal range of motion. Deformity present.       Feet:  Lymphadenopathy:     Cervical: No cervical adenopathy.  Skin:    General: Skin is warm.     Findings: No bruising, erythema, lesion or rash.  Neurological:     General: No focal deficit present.     Mental Status: She is alert and oriented to person, place, and time. Mental status is at baseline.     Cranial Nerves: No cranial nerve deficit.     Sensory: No sensory deficit.     Motor: No weakness.     Coordination: Coordination normal.     Gait: Gait normal.     Deep Tendon Reflexes: Reflexes normal.  Psychiatric:        Mood and Affect: Mood normal.        Behavior: Behavior normal.        Thought Content: Thought content normal.        Judgment: Judgment normal.       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  She is unable to extend her neck and her head essentially is locked in a downward facing position.  She has scoliosis in the back.      Assessment & Plan:  General medical exam  Benign essential HTN - Plan: CBC with Differential/Platelet, COMPLETE METABOLIC PANEL WITH GFR, Lipid panel  PVD (peripheral vascular disease) (HCC) - Plan: CBC with Differential/Platelet, COMPLETE METABOLIC PANEL WITH GFR, Lipid panel  Pure hypercholesterolemia - Plan: CBC with Differential/Platelet, COMPLETE METABOLIC PANEL WITH GFR, Lipid panel  Postmenopausal estrogen deficiency - Plan: DG Bone Density Patient's immunizations are up-to-date.  Her mammogram is up-to-date.  I will schedule the patient for a bone density test.  Also check CBC CMP and a fasting lipid panel.  Maintain LDL cholesterol less than 55.  Blood pressure today is outstanding at 124/76.  Patient denies any falls, memory loss, or depression.  Unfortunately she did lose her brother recently however she seems to be handling this very well.

## 2023-03-06 ENCOUNTER — Other Ambulatory Visit: Payer: Self-pay

## 2023-03-06 DIAGNOSIS — E7800 Pure hypercholesterolemia, unspecified: Secondary | ICD-10-CM

## 2023-03-06 DIAGNOSIS — I739 Peripheral vascular disease, unspecified: Secondary | ICD-10-CM

## 2023-03-06 DIAGNOSIS — I10 Essential (primary) hypertension: Secondary | ICD-10-CM

## 2023-03-08 LAB — LIPID PANEL
Cholesterol: 114 mg/dL (ref <200)
HDL: 68 mg/dL (ref >=50)
LDL Cholesterol (Calc): 32 mg/dL
Non-HDL Cholesterol (Calc): 46 mg/dL (ref <130)
Total CHOL/HDL Ratio: 1.7 (calc) (ref <5.0)
Triglycerides: 64 mg/dL (ref <150)

## 2023-03-08 LAB — CBC WITH DIFFERENTIAL/PLATELET
Absolute Lymphocytes: 1151 {cells}/uL (ref 850–3900)
Absolute Monocytes: 502 {cells}/uL (ref 200–950)
Basophils Absolute: 83 {cells}/uL (ref 0–200)
Basophils Relative: 1.4 %
Eosinophils Absolute: 71 {cells}/uL (ref 15–500)
Eosinophils Relative: 1.2 %
HCT: 34.1 % — ABNORMAL LOW (ref 35.0–45.0)
Hemoglobin: 10.8 g/dL — ABNORMAL LOW (ref 11.7–15.5)
MCH: 29.7 pg (ref 27.0–33.0)
MCHC: 31.7 g/dL — ABNORMAL LOW (ref 32.0–36.0)
MCV: 93.7 fL (ref 80.0–100.0)
MPV: 9.6 fL (ref 7.5–12.5)
Monocytes Relative: 8.5 %
Neutro Abs: 4095 {cells}/uL (ref 1500–7800)
Neutrophils Relative %: 69.4 %
Platelets: 441 10*3/uL — ABNORMAL HIGH (ref 140–400)
RBC: 3.64 10*6/uL — ABNORMAL LOW (ref 3.80–5.10)
RDW: 16.8 % — ABNORMAL HIGH (ref 11.0–15.0)
Total Lymphocyte: 19.5 %
WBC: 5.9 10*3/uL (ref 3.8–10.8)

## 2023-03-08 LAB — COMPLETE METABOLIC PANEL WITH GFR
AG Ratio: 1.9 (calc) (ref 1.0–2.5)
ALT: 14 U/L (ref 6–29)
AST: 28 U/L (ref 10–35)
Albumin: 4.6 g/dL (ref 3.6–5.1)
Alkaline phosphatase (APISO): 52 U/L (ref 37–153)
BUN: 14 mg/dL (ref 7–25)
CO2: 29 mmol/L (ref 20–32)
Calcium: 10.8 mg/dL — ABNORMAL HIGH (ref 8.6–10.4)
Chloride: 104 mmol/L (ref 98–110)
Creat: 0.81 mg/dL (ref 0.60–0.95)
Globulin: 2.4 g/dL (ref 1.9–3.7)
Glucose, Bld: 86 mg/dL (ref 65–99)
Potassium: 4.8 mmol/L (ref 3.5–5.3)
Sodium: 140 mmol/L (ref 135–146)
Total Bilirubin: 1.2 mg/dL (ref 0.2–1.2)
Total Protein: 7 g/dL (ref 6.1–8.1)
eGFR: 73 mL/min/{1.73_m2} (ref >=60)

## 2023-03-08 LAB — IRON,TIBC AND FERRITIN PANEL
%SAT: 21 % (ref 16–45)
Ferritin: 222 ng/mL (ref 16–288)
Iron: 64 ug/dL (ref 45–160)
TIBC: 299 ug/dL (ref 250–450)

## 2023-03-08 LAB — TEST AUTHORIZATION

## 2023-03-08 LAB — B12 AND FOLATE PANEL
Folate: >24 ng/mL
Vitamin B-12: >2000 pg/mL — ABNORMAL HIGH (ref 200–1100)

## 2023-03-11 ENCOUNTER — Telehealth: Payer: Self-pay

## 2023-03-11 NOTE — Telephone Encounter (Signed)
Copied from CRM [id number]. Topic: Clinical - Request for Lab/Test Order >> Mar 11, 2023 11:18 AM Ann Powell wrote: Reason for CRM: Pt called in regards to a possible sample she may have to give and wants to postpone in? I'm unsure. Can a nurse please give her a call to figure out what she is needing

## 2023-03-13 ENCOUNTER — Other Ambulatory Visit: Payer: Medicare HMO

## 2023-03-13 DIAGNOSIS — D649 Anemia, unspecified: Secondary | ICD-10-CM | POA: Diagnosis not present

## 2023-03-14 LAB — FECAL GLOBIN BY IMMUNOCHEMISTRY
FECAL GLOBIN RESULT:: NOT DETECTED
MICRO NUMBER:: 15843238
SPECIMEN QUALITY:: ADEQUATE

## 2023-04-23 ENCOUNTER — Ambulatory Visit: Payer: Medicare HMO | Admitting: Podiatry

## 2023-05-06 ENCOUNTER — Other Ambulatory Visit: Payer: Self-pay | Admitting: Family Medicine

## 2023-05-06 DIAGNOSIS — E7800 Pure hypercholesterolemia, unspecified: Secondary | ICD-10-CM

## 2023-06-23 ENCOUNTER — Ambulatory Visit: Payer: Medicare HMO | Admitting: Podiatry

## 2023-06-23 ENCOUNTER — Encounter: Payer: Self-pay | Admitting: Podiatry

## 2023-06-23 DIAGNOSIS — B351 Tinea unguium: Secondary | ICD-10-CM | POA: Diagnosis not present

## 2023-06-23 DIAGNOSIS — S98132S Complete traumatic amputation of one left lesser toe, sequela: Secondary | ICD-10-CM | POA: Diagnosis not present

## 2023-06-23 DIAGNOSIS — M79609 Pain in unspecified limb: Secondary | ICD-10-CM

## 2023-06-23 DIAGNOSIS — E0842 Diabetes mellitus due to underlying condition with diabetic polyneuropathy: Secondary | ICD-10-CM | POA: Diagnosis not present

## 2023-06-23 DIAGNOSIS — I739 Peripheral vascular disease, unspecified: Secondary | ICD-10-CM | POA: Diagnosis not present

## 2023-06-23 NOTE — Progress Notes (Signed)
 This patient returns to my office for at risk foot care.  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  This patient is unable to cut nails herself since the patient cannot reach her nails.These nails are painful walking and wearing shoes.   This patient presents for at risk foot care today.  General Appearance  Alert, conversant and in no acute stress.  Vascular  Dorsalis pedis and posterior tibial  pulses are weakly  palpable  bilaterally.  Capillary return is within normal limits  bilaterally. Temperature is within normal limits  bilaterally.  Neurologic  Senn-Weinstein monofilament wire test within normal limits  bilaterally. Muscle power within normal limits bilaterally.  Nails Thick disfigured discolored nails with subungual debris hallux nails  bilaterally. No evidence of bacterial infection or drainage bilaterally.  Nail dystrophy right hallux toenail.  Orthopedic  No limitations of motion  feet .  No crepitus or effusions noted.  No bony pathology or digital deformities noted. Amputation 3rd toe left foot.   Skin  normotropic skin with no porokeratosis noted bilaterally.  No signs of infections or ulcers noted.     Onychomycosis  Pain in right toes  Pain in left toes    Consent was obtained for treatment procedures.   Mechanical debridement of nails 1-5  right and 1-2 and 4,5 left foot.performed with a nail nipper.  Filed with dremel without incident. Patient presented with a band-aid on her fourth toe right foot.  She also had a bandage covering her toes right foot.  She says she has been bandaging her toes herself  and she is not under a doctors care.  She says she had vascular studies performed on his left extremity. This led to vascular surgery and amputation third toe left foot.  No evaluation of her right foot was performed.  She has no open wounds to her toes right foot .  Her second  toe is healing dorsally.  No drainage or infection.  Masceration fourth toe due to band-aid application.  Nail debridement was performed.  I am concerned that her vascular studies need to be performed on her right side.  She says she wants to continue working on the toes herself.  If this condition worsens she will call the office.  RTC 3 months for nail care.  She would benefit from vascular studies.   Return office visit      3 months  for nail care.              Told patient to return for periodic foot care and evaluation due to potential at risk complications.   Helane Gunther DPM

## 2023-06-27 ENCOUNTER — Emergency Department (HOSPITAL_COMMUNITY)

## 2023-06-27 ENCOUNTER — Inpatient Hospital Stay (HOSPITAL_COMMUNITY)

## 2023-06-27 ENCOUNTER — Inpatient Hospital Stay (HOSPITAL_COMMUNITY)
Admission: EM | Admit: 2023-06-27 | Discharge: 2023-07-07 | DRG: 208 | Disposition: A | Discharge status: Skilled Nursing Facility | Attending: Internal Medicine | Admitting: Internal Medicine

## 2023-06-27 ENCOUNTER — Encounter (HOSPITAL_COMMUNITY): Payer: Self-pay

## 2023-06-27 ENCOUNTER — Other Ambulatory Visit: Payer: Self-pay

## 2023-06-27 DIAGNOSIS — I700 Atherosclerosis of aorta: Secondary | ICD-10-CM | POA: Diagnosis present

## 2023-06-27 DIAGNOSIS — R2689 Other abnormalities of gait and mobility: Secondary | ICD-10-CM | POA: Diagnosis not present

## 2023-06-27 DIAGNOSIS — D75839 Thrombocytosis, unspecified: Secondary | ICD-10-CM | POA: Diagnosis not present

## 2023-06-27 DIAGNOSIS — I110 Hypertensive heart disease with heart failure: Secondary | ICD-10-CM | POA: Diagnosis present

## 2023-06-27 DIAGNOSIS — I4891 Unspecified atrial fibrillation: Secondary | ICD-10-CM | POA: Diagnosis not present

## 2023-06-27 DIAGNOSIS — I5021 Acute systolic (congestive) heart failure: Secondary | ICD-10-CM

## 2023-06-27 DIAGNOSIS — J9690 Respiratory failure, unspecified, unspecified whether with hypoxia or hypercapnia: Secondary | ICD-10-CM | POA: Diagnosis not present

## 2023-06-27 DIAGNOSIS — L97519 Non-pressure chronic ulcer of other part of right foot with unspecified severity: Secondary | ICD-10-CM | POA: Diagnosis present

## 2023-06-27 DIAGNOSIS — Z4682 Encounter for fitting and adjustment of non-vascular catheter: Secondary | ICD-10-CM | POA: Diagnosis not present

## 2023-06-27 DIAGNOSIS — R0603 Acute respiratory distress: Secondary | ICD-10-CM | POA: Diagnosis not present

## 2023-06-27 DIAGNOSIS — R64 Cachexia: Secondary | ICD-10-CM | POA: Diagnosis present

## 2023-06-27 DIAGNOSIS — I2510 Atherosclerotic heart disease of native coronary artery without angina pectoris: Secondary | ICD-10-CM | POA: Diagnosis not present

## 2023-06-27 DIAGNOSIS — J9600 Acute respiratory failure, unspecified whether with hypoxia or hypercapnia: Secondary | ICD-10-CM | POA: Diagnosis present

## 2023-06-27 DIAGNOSIS — E785 Hyperlipidemia, unspecified: Secondary | ICD-10-CM | POA: Diagnosis present

## 2023-06-27 DIAGNOSIS — D72829 Elevated white blood cell count, unspecified: Secondary | ICD-10-CM | POA: Diagnosis not present

## 2023-06-27 DIAGNOSIS — M6281 Muscle weakness (generalized): Secondary | ICD-10-CM | POA: Diagnosis not present

## 2023-06-27 DIAGNOSIS — J9602 Acute respiratory failure with hypercapnia: Secondary | ICD-10-CM | POA: Diagnosis not present

## 2023-06-27 DIAGNOSIS — I21A1 Myocardial infarction type 2: Secondary | ICD-10-CM | POA: Diagnosis not present

## 2023-06-27 DIAGNOSIS — M40202 Unspecified kyphosis, cervical region: Secondary | ICD-10-CM | POA: Diagnosis present

## 2023-06-27 DIAGNOSIS — R570 Cardiogenic shock: Secondary | ICD-10-CM | POA: Diagnosis not present

## 2023-06-27 DIAGNOSIS — Z79899 Other long term (current) drug therapy: Secondary | ICD-10-CM

## 2023-06-27 DIAGNOSIS — D638 Anemia in other chronic diseases classified elsewhere: Secondary | ICD-10-CM | POA: Diagnosis not present

## 2023-06-27 DIAGNOSIS — E8729 Other acidosis: Secondary | ICD-10-CM | POA: Diagnosis present

## 2023-06-27 DIAGNOSIS — J810 Acute pulmonary edema: Secondary | ICD-10-CM | POA: Diagnosis not present

## 2023-06-27 DIAGNOSIS — Z955 Presence of coronary angioplasty implant and graft: Secondary | ICD-10-CM | POA: Diagnosis not present

## 2023-06-27 DIAGNOSIS — J9601 Acute respiratory failure with hypoxia: Principal | ICD-10-CM | POA: Diagnosis present

## 2023-06-27 DIAGNOSIS — E0842 Diabetes mellitus due to underlying condition with diabetic polyneuropathy: Secondary | ICD-10-CM | POA: Diagnosis not present

## 2023-06-27 DIAGNOSIS — Z7401 Bed confinement status: Secondary | ICD-10-CM | POA: Diagnosis not present

## 2023-06-27 DIAGNOSIS — R739 Hyperglycemia, unspecified: Secondary | ICD-10-CM | POA: Diagnosis present

## 2023-06-27 DIAGNOSIS — I214 Non-ST elevation (NSTEMI) myocardial infarction: Secondary | ICD-10-CM | POA: Diagnosis not present

## 2023-06-27 DIAGNOSIS — Z7982 Long term (current) use of aspirin: Secondary | ICD-10-CM

## 2023-06-27 DIAGNOSIS — M40209 Unspecified kyphosis, site unspecified: Secondary | ICD-10-CM | POA: Diagnosis not present

## 2023-06-27 DIAGNOSIS — I4719 Other supraventricular tachycardia: Secondary | ICD-10-CM | POA: Diagnosis present

## 2023-06-27 DIAGNOSIS — R0902 Hypoxemia: Secondary | ICD-10-CM | POA: Diagnosis not present

## 2023-06-27 DIAGNOSIS — R54 Age-related physical debility: Secondary | ICD-10-CM | POA: Diagnosis present

## 2023-06-27 DIAGNOSIS — W19XXXA Unspecified fall, initial encounter: Secondary | ICD-10-CM | POA: Diagnosis not present

## 2023-06-27 DIAGNOSIS — R0602 Shortness of breath: Secondary | ICD-10-CM | POA: Diagnosis not present

## 2023-06-27 DIAGNOSIS — J189 Pneumonia, unspecified organism: Secondary | ICD-10-CM | POA: Diagnosis not present

## 2023-06-27 DIAGNOSIS — I480 Paroxysmal atrial fibrillation: Secondary | ICD-10-CM | POA: Diagnosis not present

## 2023-06-27 DIAGNOSIS — I878 Other specified disorders of veins: Secondary | ICD-10-CM | POA: Diagnosis present

## 2023-06-27 DIAGNOSIS — K219 Gastro-esophageal reflux disease without esophagitis: Secondary | ICD-10-CM | POA: Diagnosis present

## 2023-06-27 DIAGNOSIS — J90 Pleural effusion, not elsewhere classified: Secondary | ICD-10-CM | POA: Diagnosis not present

## 2023-06-27 DIAGNOSIS — Z7902 Long term (current) use of antithrombotics/antiplatelets: Secondary | ICD-10-CM

## 2023-06-27 DIAGNOSIS — L89153 Pressure ulcer of sacral region, stage 3: Secondary | ICD-10-CM | POA: Diagnosis not present

## 2023-06-27 DIAGNOSIS — E162 Hypoglycemia, unspecified: Secondary | ICD-10-CM | POA: Diagnosis present

## 2023-06-27 DIAGNOSIS — Z7984 Long term (current) use of oral hypoglycemic drugs: Secondary | ICD-10-CM | POA: Diagnosis not present

## 2023-06-27 DIAGNOSIS — R918 Other nonspecific abnormal finding of lung field: Secondary | ICD-10-CM | POA: Diagnosis not present

## 2023-06-27 DIAGNOSIS — J811 Chronic pulmonary edema: Secondary | ICD-10-CM | POA: Diagnosis not present

## 2023-06-27 LAB — CBC WITH DIFFERENTIAL/PLATELET
Abs Immature Granulocytes: 0 10*3/uL (ref 0.00–0.07)
Basophils Absolute: 0 10*3/uL (ref 0.0–0.1)
Basophils Relative: 0 %
Eosinophils Absolute: 0.4 10*3/uL (ref 0.0–0.5)
Eosinophils Relative: 1 %
HCT: 41.8 % (ref 36.0–46.0)
Hemoglobin: 11.8 g/dL — ABNORMAL LOW (ref 12.0–15.0)
Lymphocytes Relative: 35 %
Lymphs Abs: 12.3 10*3/uL — ABNORMAL HIGH (ref 0.7–4.0)
MCH: 28.4 pg (ref 26.0–34.0)
MCHC: 28.2 g/dL — ABNORMAL LOW (ref 30.0–36.0)
MCV: 100.5 fL — ABNORMAL HIGH (ref 80.0–100.0)
Monocytes Absolute: 2.8 10*3/uL — ABNORMAL HIGH (ref 0.1–1.0)
Monocytes Relative: 8 %
Neutro Abs: 19.7 10*3/uL — ABNORMAL HIGH (ref 1.7–7.7)
Neutrophils Relative %: 56 %
Platelets: 1204 10*3/uL (ref 150–400)
RBC: 4.16 MIL/uL (ref 3.87–5.11)
RDW: 19.9 % — ABNORMAL HIGH (ref 11.5–15.5)
WBC: 35.2 10*3/uL — ABNORMAL HIGH (ref 4.0–10.5)
nRBC: 11.9 % — ABNORMAL HIGH (ref 0.0–0.2)
nRBC: 8 /100{WBCs} — ABNORMAL HIGH

## 2023-06-27 LAB — RESPIRATORY PANEL BY PCR

## 2023-06-27 LAB — ECHOCARDIOGRAM COMPLETE
Height: 62 in
P 1/2 time: 247 ms
S' Lateral: 3.6 cm
Single Plane A2C EF: 43.5 %
Weight: 1904.77 [oz_av]

## 2023-06-27 LAB — I-STAT ARTERIAL BLOOD GAS, ED
Acid-base deficit: 1 mmol/L (ref 0.0–2.0)
Bicarbonate: 26.2 mmol/L (ref 20.0–28.0)
Calcium, Ion: 1.3 mmol/L (ref 1.15–1.40)
HCT: 36 % (ref 36.0–46.0)
Hemoglobin: 12.2 g/dL (ref 12.0–15.0)
O2 Saturation: 99 %
Potassium: 3.3 mmol/L — ABNORMAL LOW (ref 3.5–5.1)
Sodium: 141 mmol/L (ref 135–145)
TCO2: 28 mmol/L (ref 22–32)
pCO2 arterial: 56.1 mmHg — ABNORMAL HIGH (ref 32–48)
pH, Arterial: 7.277 — ABNORMAL LOW (ref 7.35–7.45)
pO2, Arterial: 144 mmHg — ABNORMAL HIGH (ref 83–108)

## 2023-06-27 LAB — I-STAT CHEM 8, ED
BUN: 12 mg/dL (ref 8–23)
Calcium, Ion: 1.33 mmol/L (ref 1.15–1.40)
Chloride: 107 mmol/L (ref 98–111)
Creatinine, Ser: 0.9 mg/dL (ref 0.44–1.00)
Glucose, Bld: 227 mg/dL — ABNORMAL HIGH (ref 70–99)
HCT: 40 % (ref 36.0–46.0)
Hemoglobin: 13.6 g/dL (ref 12.0–15.0)
Potassium: 4.4 mmol/L (ref 3.5–5.1)
Sodium: 141 mmol/L (ref 135–145)
TCO2: 24 mmol/L (ref 22–32)

## 2023-06-27 LAB — POCT I-STAT 7, (LYTES, BLD GAS, ICA,H+H)
Acid-Base Excess: 0 mmol/L (ref 0.0–2.0)
Bicarbonate: 24.5 mmol/L (ref 20.0–28.0)
Calcium, Ion: 1.22 mmol/L (ref 1.15–1.40)
HCT: 27 % — ABNORMAL LOW (ref 36.0–46.0)
Hemoglobin: 9.2 g/dL — ABNORMAL LOW (ref 12.0–15.0)
O2 Saturation: 99 %
Patient temperature: 37.1
Potassium: 3.4 mmol/L — ABNORMAL LOW (ref 3.5–5.1)
Sodium: 141 mmol/L (ref 135–145)
TCO2: 26 mmol/L (ref 22–32)
pCO2 arterial: 37.9 mmHg (ref 32–48)
pH, Arterial: 7.419 (ref 7.35–7.45)
pO2, Arterial: 143 mmHg — ABNORMAL HIGH (ref 83–108)

## 2023-06-27 LAB — COMPREHENSIVE METABOLIC PANEL WITH GFR
ALT: 17 U/L (ref 0–44)
AST: 42 U/L — ABNORMAL HIGH (ref 15–41)
Albumin: 3.8 g/dL (ref 3.5–5.0)
Alkaline Phosphatase: 66 U/L (ref 38–126)
Anion gap: 9 (ref 5–15)
BUN: 11 mg/dL (ref 8–23)
CO2: 23 mmol/L (ref 22–32)
Calcium: 9.7 mg/dL (ref 8.9–10.3)
Chloride: 108 mmol/L (ref 98–111)
Creatinine, Ser: 0.84 mg/dL (ref 0.44–1.00)
GFR, Estimated: >60 mL/min (ref >60)
Glucose, Bld: 241 mg/dL — ABNORMAL HIGH (ref 70–99)
Potassium: 4.7 mmol/L (ref 3.5–5.1)
Sodium: 140 mmol/L (ref 135–145)
Total Bilirubin: 1.1 mg/dL (ref 0.0–1.2)
Total Protein: 7.1 g/dL (ref 6.5–8.1)

## 2023-06-27 LAB — CBC
HCT: 40.2 % (ref 36.0–46.0)
Hemoglobin: 11.7 g/dL — ABNORMAL LOW (ref 12.0–15.0)
MCH: 28.7 pg (ref 26.0–34.0)
MCHC: 29.1 g/dL — ABNORMAL LOW (ref 30.0–36.0)
MCV: 98.5 fL (ref 80.0–100.0)
Platelets: 723 10*3/uL — ABNORMAL HIGH (ref 150–400)
RBC: 4.08 MIL/uL (ref 3.87–5.11)
RDW: 19.7 % — ABNORMAL HIGH (ref 11.5–15.5)
WBC: 27.5 10*3/uL — ABNORMAL HIGH (ref 4.0–10.5)
nRBC: 5.3 % — ABNORMAL HIGH (ref 0.0–0.2)

## 2023-06-27 LAB — I-STAT VENOUS BLOOD GAS, ED
Acid-base deficit: 6 mmol/L — ABNORMAL HIGH (ref 0.0–2.0)
Bicarbonate: 23.4 mmol/L (ref 20.0–28.0)
Calcium, Ion: 1.32 mmol/L (ref 1.15–1.40)
HCT: 41 % (ref 36.0–46.0)
Hemoglobin: 13.9 g/dL (ref 12.0–15.0)
O2 Saturation: 63 %
Potassium: 4.4 mmol/L (ref 3.5–5.1)
Sodium: 140 mmol/L (ref 135–145)
TCO2: 25 mmol/L (ref 22–32)
pCO2, Ven: 62.2 mmHg — ABNORMAL HIGH (ref 44–60)
pH, Ven: 7.183 — CL (ref 7.25–7.43)
pO2, Ven: 41 mmHg (ref 32–45)

## 2023-06-27 LAB — HEMOGLOBIN A1C
Hgb A1c MFr Bld: 4.4 % — ABNORMAL LOW (ref 4.8–5.6)
Mean Plasma Glucose: 79.58 mg/dL

## 2023-06-27 LAB — STREP PNEUMONIAE URINARY ANTIGEN: Strep Pneumo Urinary Antigen: NEGATIVE

## 2023-06-27 LAB — MRSA NEXT GEN BY PCR, NASAL: MRSA by PCR Next Gen: NOT DETECTED

## 2023-06-27 LAB — GLUCOSE, CAPILLARY
Glucose-Capillary: 107 mg/dL — ABNORMAL HIGH (ref 70–99)
Glucose-Capillary: 130 mg/dL — ABNORMAL HIGH (ref 70–99)
Glucose-Capillary: 132 mg/dL — ABNORMAL HIGH (ref 70–99)
Glucose-Capillary: 142 mg/dL — ABNORMAL HIGH (ref 70–99)

## 2023-06-27 LAB — TROPONIN I (HIGH SENSITIVITY)
Troponin I (High Sensitivity): 636 ng/L (ref <18)
Troponin I (High Sensitivity): 881 ng/L (ref <18)
Troponin I (High Sensitivity): 1135 ng/L (ref <18)

## 2023-06-27 LAB — SARS CORONAVIRUS 2 BY RT PCR: SARS Coronavirus 2 by RT PCR: NEGATIVE

## 2023-06-27 LAB — CREATININE, SERUM
Creatinine, Ser: 0.94 mg/dL (ref 0.44–1.00)
GFR, Estimated: >60 mL/min (ref >60)

## 2023-06-27 LAB — PROTIME-INR
INR: 1.1 (ref 0.8–1.2)
Prothrombin Time: 14.1 s (ref 11.4–15.2)

## 2023-06-27 LAB — I-STAT CG4 LACTIC ACID, ED: Lactic Acid, Venous: 2.7 mmol/L (ref 0.5–1.9)

## 2023-06-27 LAB — APTT: aPTT: 28 s (ref 24–36)

## 2023-06-27 LAB — BRAIN NATRIURETIC PEPTIDE: B Natriuretic Peptide: 850.7 pg/mL — ABNORMAL HIGH (ref 0.0–100.0)

## 2023-06-27 MED ORDER — DOCUSATE SODIUM 50 MG/5ML PO LIQD: 100.0000 mg | Freq: Two times a day (BID) | ORAL | Status: DC | PRN

## 2023-06-27 MED ORDER — HEPARIN (PORCINE) 25000 UT/250ML-% IV SOLN
950.0000 [IU]/h | INTRAVENOUS | Status: AC
  Administered 2023-06-27: 650 [IU]/h via INTRAVENOUS
  Administered 2023-06-28: 950 [IU]/h via INTRAVENOUS
  Filled 2023-06-27 (×2): qty 250

## 2023-06-27 MED ORDER — SODIUM CHLORIDE 0.9 % IV SOLN
250.0000 mL | INTRAVENOUS | Status: AC
Start: 2023-06-27 — End: 2023-06-28

## 2023-06-27 MED ORDER — POLYETHYLENE GLYCOL 3350 17 G PO PACK
17.0000 g | PACK | Freq: Every day | ORAL | Status: DC
  Administered 2023-06-27 – 2023-06-29 (×3): 17 g
  Filled 2023-06-27 (×3): qty 1

## 2023-06-27 MED ORDER — LACTATED RINGERS IV BOLUS
250.0000 mL | Freq: Once | INTRAVENOUS | Status: AC
  Administered 2023-06-27: 250 mL via INTRAVENOUS

## 2023-06-27 MED ORDER — FUROSEMIDE 10 MG/ML IJ SOLN
40.0000 mg | Freq: Once | INTRAMUSCULAR | Status: AC
  Administered 2023-06-27: 40 mg via INTRAVENOUS
  Filled 2023-06-27: qty 4

## 2023-06-27 MED ORDER — HEPARIN SODIUM (PORCINE) 5000 UNIT/ML IJ SOLN
5000.0000 [IU] | Freq: Three times a day (TID) | INTRAMUSCULAR | Status: DC
  Administered 2023-06-27: 5000 [IU] via SUBCUTANEOUS
  Filled 2023-06-27: qty 1

## 2023-06-27 MED ORDER — SODIUM CHLORIDE 0.9 % IV SOLN
500.0000 mg | Freq: Once | INTRAVENOUS | Status: AC
  Administered 2023-06-27: 500 mg via INTRAVENOUS
  Filled 2023-06-27: qty 5

## 2023-06-27 MED ORDER — ONDANSETRON HCL 4 MG/2ML IJ SOLN
INTRAMUSCULAR | Status: AC
  Administered 2023-06-27: 4 mg via INTRAVENOUS
  Filled 2023-06-27: qty 2

## 2023-06-27 MED ORDER — FAMOTIDINE 20 MG PO TABS
20.0000 mg | ORAL_TABLET | Freq: Every day | ORAL | Status: DC
  Administered 2023-06-27 – 2023-06-29 (×3): 20 mg
  Filled 2023-06-27 (×3): qty 1

## 2023-06-27 MED ORDER — ASPIRIN 325 MG PO TABS
325.0000 mg | ORAL_TABLET | Freq: Every day | ORAL | Status: DC
  Administered 2023-06-27: 325 mg
  Filled 2023-06-27: qty 1

## 2023-06-27 MED ORDER — SODIUM CHLORIDE 0.9 % IV SOLN: INTRAVENOUS | Status: AC

## 2023-06-27 MED ORDER — FENTANYL CITRATE PF 50 MCG/ML IJ SOSY
25.0000 ug | PREFILLED_SYRINGE | INTRAMUSCULAR | Status: DC | PRN
  Administered 2023-06-27: 50 ug via INTRAVENOUS
  Filled 2023-06-27: qty 1

## 2023-06-27 MED ORDER — CHLORHEXIDINE GLUCONATE CLOTH 2 % EX PADS
6.0000 | MEDICATED_PAD | Freq: Every day | CUTANEOUS | Status: DC
  Administered 2023-06-27 – 2023-07-06 (×10): 6 via TOPICAL

## 2023-06-27 MED ORDER — FENTANYL CITRATE PF 50 MCG/ML IJ SOSY
12.5000 ug | PREFILLED_SYRINGE | INTRAMUSCULAR | Status: DC | PRN
  Filled 2023-06-27: qty 1

## 2023-06-27 MED ORDER — IPRATROPIUM-ALBUTEROL 0.5-2.5 (3) MG/3ML IN SOLN
3.0000 mL | Freq: Four times a day (QID) | RESPIRATORY_TRACT | Status: DC | PRN
  Administered 2023-06-30: 3 mL via RESPIRATORY_TRACT
  Filled 2023-06-27: qty 3

## 2023-06-27 MED ORDER — SODIUM CHLORIDE 0.9 % IV SOLN
2.0000 g | INTRAVENOUS | Status: AC
  Administered 2023-06-28 – 2023-07-01 (×4): 2 g via INTRAVENOUS
  Filled 2023-06-27 (×4): qty 20

## 2023-06-27 MED ORDER — NITROGLYCERIN 0.4 MG SL SUBL
SUBLINGUAL_TABLET | SUBLINGUAL | Status: AC
  Administered 2023-06-27: 0.4 mg via SUBLINGUAL
  Filled 2023-06-27: qty 1

## 2023-06-27 MED ORDER — NITROGLYCERIN 0.4 MG SL SUBL: 0.4000 mg | SUBLINGUAL_TABLET | SUBLINGUAL | Status: DC | PRN

## 2023-06-27 MED ORDER — CLOPIDOGREL BISULFATE 75 MG PO TABS
75.0000 mg | ORAL_TABLET | Freq: Every day | ORAL | Status: DC
  Administered 2023-06-27 – 2023-06-29 (×3): 75 mg
  Filled 2023-06-27 (×3): qty 1

## 2023-06-27 MED ORDER — ROSUVASTATIN CALCIUM 20 MG PO TABS
20.0000 mg | ORAL_TABLET | Freq: Every day | ORAL | Status: DC
  Administered 2023-06-27 – 2023-06-30 (×4): 20 mg
  Filled 2023-06-27 (×4): qty 1

## 2023-06-27 MED ORDER — FENTANYL CITRATE PF 50 MCG/ML IJ SOSY: 25.0000 ug | PREFILLED_SYRINGE | INTRAMUSCULAR | Status: DC | PRN

## 2023-06-27 MED ORDER — IPRATROPIUM-ALBUTEROL 0.5-2.5 (3) MG/3ML IN SOLN
3.0000 mL | Freq: Four times a day (QID) | RESPIRATORY_TRACT | Status: DC
  Administered 2023-06-27 (×2): 3 mL via RESPIRATORY_TRACT
  Filled 2023-06-27 (×2): qty 3

## 2023-06-27 MED ORDER — ROCURONIUM BROMIDE 10 MG/ML (PF) SYRINGE
PREFILLED_SYRINGE | INTRAVENOUS | Status: AC | PRN
  Administered 2023-06-27: 70 mg via INTRAVENOUS

## 2023-06-27 MED ORDER — PROPOFOL 1000 MG/100ML IV EMUL: 0.0000 ug/kg/min | INTRAVENOUS | Status: DC

## 2023-06-27 MED ORDER — IOHEXOL 350 MG/ML SOLN
75.0000 mL | Freq: Once | INTRAVENOUS | Status: AC | PRN
  Administered 2023-06-27: 75 mL via INTRAVENOUS

## 2023-06-27 MED ORDER — PROPOFOL 1000 MG/100ML IV EMUL
5.0000 ug/kg/min | INTRAVENOUS | Status: DC
  Administered 2023-06-27: 20 ug/kg/min via INTRAVENOUS

## 2023-06-27 MED ORDER — ASPIRIN 81 MG PO CHEW
81.0000 mg | CHEWABLE_TABLET | Freq: Every day | ORAL | Status: DC
  Administered 2023-06-28 – 2023-06-29 (×2): 81 mg
  Filled 2023-06-27 (×2): qty 1

## 2023-06-27 MED ORDER — SODIUM CHLORIDE 0.9 % IV SOLN
500.0000 mg | INTRAVENOUS | Status: DC
  Administered 2023-06-28 – 2023-06-29 (×2): 500 mg via INTRAVENOUS
  Filled 2023-06-27 (×2): qty 5

## 2023-06-27 MED ORDER — SODIUM CHLORIDE 0.9 % IV SOLN
2.0000 g | Freq: Once | INTRAVENOUS | Status: AC
  Administered 2023-06-27: 2 g via INTRAVENOUS
  Filled 2023-06-27: qty 20

## 2023-06-27 MED ORDER — ETOMIDATE 2 MG/ML IV SOLN
INTRAVENOUS | Status: AC | PRN
  Administered 2023-06-27: 15 mg via INTRAVENOUS

## 2023-06-27 MED ORDER — NOREPINEPHRINE 4 MG/250ML-% IV SOLN
INTRAVENOUS | Status: AC
  Administered 2023-06-27: 2 ug/min via INTRAVENOUS
  Filled 2023-06-27: qty 250

## 2023-06-27 MED ORDER — POLYETHYLENE GLYCOL 3350 17 G PO PACK: 17.0000 g | PACK | Freq: Every day | ORAL | Status: DC | PRN

## 2023-06-27 MED ORDER — IPRATROPIUM-ALBUTEROL 0.5-2.5 (3) MG/3ML IN SOLN: 3.0000 mL | Freq: Once | RESPIRATORY_TRACT | Status: DC

## 2023-06-27 MED ORDER — DOCUSATE SODIUM 50 MG/5ML PO LIQD
100.0000 mg | Freq: Two times a day (BID) | ORAL | Status: DC
  Administered 2023-06-27 – 2023-06-29 (×5): 100 mg
  Filled 2023-06-27 (×5): qty 10

## 2023-06-27 MED ORDER — INSULIN ASPART 100 UNIT/ML IJ SOLN
0.0000 [IU] | INTRAMUSCULAR | Status: DC
  Administered 2023-06-27 – 2023-06-29 (×4): 1 [IU] via SUBCUTANEOUS

## 2023-06-27 MED ORDER — NOREPINEPHRINE 4 MG/250ML-% IV SOLN
2.0000 ug/min | INTRAVENOUS | Status: DC
  Filled 2023-06-27: qty 250

## 2023-06-27 MED ORDER — DEXMEDETOMIDINE HCL IN NACL 400 MCG/100ML IV SOLN
0.0000 ug/kg/h | INTRAVENOUS | Status: DC
  Administered 2023-06-27: 0.4 ug/kg/h via INTRAVENOUS
  Administered 2023-06-28: 0.7 ug/kg/h via INTRAVENOUS
  Administered 2023-06-28: 0.052 ug/kg/h via INTRAVENOUS
  Administered 2023-06-28: 0.4 ug/kg/h via INTRAVENOUS
  Filled 2023-06-27 (×3): qty 100

## 2023-06-27 MED ORDER — ONDANSETRON HCL 4 MG/2ML IJ SOLN: 4.0000 mg | Freq: Once | INTRAMUSCULAR | Status: AC

## 2023-06-27 NOTE — Progress Notes (Signed)
 eLink Physician-Brief Progress Note Patient Name: Ann Powell DOB: 04/13/1940 MRN: [id number]   Date of Service  06/27/2023  HPI/Events of Note  Patient with hypotension following 12.5 mcg of Fentanyl, following a > 1300 ml diuresis in response to Lasix 40 mg IV.   eICU Interventions  Peripheral Levophed gtt ordered, along with a 250 ml LR bolus.        Migdalia Dk 06/27/2023, 8:27 PM

## 2023-06-27 NOTE — Progress Notes (Signed)
 RT attempted ABG x 2 without any success. RN notified to try for a VBG if acceptable.

## 2023-06-27 NOTE — Progress Notes (Signed)
 Patient was intubated by ED MD without complications. Positive color change noted.  Bilateral breath sounds auscultated.  Chest xray obtained.  Will continue to monitor.

## 2023-06-27 NOTE — Progress Notes (Signed)
 PHARMACY - ANTICOAGULATION CONSULT NOTE  Pharmacy Consult:  Heparin Indication: chest pain/ACS  No Known Allergies  Patient Measurements: Height: 5\' 2"  (157.5 cm) Weight: 54 kg (119 lb 0.8 oz) IBW/kg (Calculated) : 50.1 HEPARIN DW (KG): 53.7  Vital Signs: Temp: 97.3 F (36.3 C) (03/28 1207) Temp Source: Oral (03/28 1207) BP: 133/79 (03/28 1300) Pulse Rate: 76 (03/28 1300)  Labs: Recent Labs    06/27/23 1142 06/27/23 1147 06/27/23 1325  HGB 11.8* 13.6  13.9 12.2  HCT 41.8 40.0  41.0 36.0  PLT 1,204*  --   --   CREATININE 0.84 0.90  --   TROPONINIHS 636*  --   --     Estimated Creatinine Clearance: 38.8 mL/min (by C-G formula based on SCr of 0.9 mg/dL).   Medical History: Past Medical History:  Diagnosis Date   History of gastric ulcer    "after hysterectomy"   Hypertension    PVD (peripheral vascular disease) (HCC)    Ulcer      Assessment: 78 YOF presented with SOB and was intubated in the ED.  Troponin is elevated and Pharmacy consulted to dose IV heparin.  H/H stable and platelet count is elevated.  No bleeding reported.  Goal of Therapy:  Heparin level 0.3-0.7 units/ml Monitor platelets by anticoagulation protocol: Yes   Plan:  D/C HSQ Heparin gtt at 650 units/hr - no bolus as patient just received HSQ Check 8 hr heparin level Daily heparin level and CBC  Javan Gonzaga D. Laney Potash, PharmD, BCPS, BCCCP 06/27/2023, 2:55 PM

## 2023-06-27 NOTE — Progress Notes (Signed)
 Patient transported to CT and to room 3M11, on ventilator, without complications.

## 2023-06-27 NOTE — ED Notes (Signed)
 CRITICAL VALUE STICKER  CRITICAL VALUE: Tropinin 636  RECEIVER (on-site recipient of call):Bob Daversa , RN  DATE & TIME NOTIFIED: 06/27/23 1303  MESSENGER (representative from lab):  MD NOTIFIED: Dr. Renard Hamper  TIME OF NOTIFICATION:1304  RESPONSE:

## 2023-06-27 NOTE — ED Notes (Signed)
Phlebotomy at bedside to collect blood cultures

## 2023-06-27 NOTE — Progress Notes (Signed)
 Post intubation ABG obtained on ventilator settings of VT: 400, RR: 20, FIO2: 100%, and PEEP: 8.  Increased RR to 24.  Tolerating well at this time.     Latest Reference Range & Units 06/27/23 13:25  Sample type  ARTERIAL  pH, Arterial 7.35 - 7.45  7.277 (L)  pCO2 arterial 32 - 48 mmHg 56.1 (H)  pO2, Arterial 83 - 108 mmHg 144 (H)  TCO2 22 - 32 mmol/L 28  Acid-base deficit 0.0 - 2.0 mmol/L 1.0  Bicarbonate 20.0 - 28.0 mmol/L 26.2  O2 Saturation % 99

## 2023-06-27 NOTE — ED Notes (Signed)
 CRITICAL VALUE STICKER  CRITICAL VALUE: Platelet 1204  RECEIVER (on-site recipient of call Claudia Desanctis, RN  DATE & TIME NOTIFIED: 06/27/2023 1230  MESSENGER (representative from lab):  MD NOTIFIED: Rhoderick Moody, MD  TIME OF NOTIFICATION:1231  RESPONSE:

## 2023-06-27 NOTE — Progress Notes (Signed)
 Called tp bedside. Hypotensive after fent gtt.  Plan Dc prop Reduce fent to 12. dose Add dex

## 2023-06-27 NOTE — Progress Notes (Signed)
  Echocardiogram 2D Echocardiogram has been performed.  Ann Powell 06/27/2023, 5:11 PM

## 2023-06-27 NOTE — ED Provider Notes (Addendum)
 [place] EMERGENCY DEPARTMENT AT [facility] Provider Note  CSN: [id number] Arrival date & time: 06/27/23 1112  Chief Complaint(s) Shortness of Breath  HPI Nicole Cella May Claytor is a 83 y.o. female history of peripheral vascular disease, hypertension, hyperlipidemia presenting to the emergency department with shortness of breath.  Patient's niece reports that they were going to the bank, suddenly developed shortness of breath and dyspnea.  Brought patient to the emergency department.  Patient unable to provide much additional history given severe respiratory distress.  History limited due to respiratory distress.  Patient's niece denies any additional recent illness or symptoms and reports she was doing well prior to this happening.   Past Medical History Past Medical History:  Diagnosis Date   History of gastric ulcer    "after hysterectomy"   Hypertension    PVD (peripheral vascular disease) (HCC)    Ulcer    Patient Active Problem List   Diagnosis Date Noted   Acute respiratory failure (HCC) 06/27/2023   PVD (peripheral vascular disease) (HCC)    Hypertension 01/29/2017   Edema of right lower extremity 09/25/2016   Gangrenous (HCC) 09/25/2016   Pain in extremity 09/25/2016   PAD (peripheral artery disease) (HCC) 09/25/2016   Critical lower limb ischemia (HCC) 08/22/2016   Home Medication(s) Prior to Admission medications   Medication Sig Start Date End Date Taking? Authorizing Provider  aspirin 325 MG tablet Take 325 mg by mouth. 10/01/16   [provider]  carbonyl iron (FEOSOL) 45 MG TABS tablet Take 45 mg by mouth.    [provider]  cholecalciferol (VITAMIN D) 1000 units tablet Take 1,000 Units by mouth daily.    [provider]  clopidogrel (PLAVIX) 75 MG tablet TAKE 1 TABLET (75 MG TOTAL) BY MOUTH DAILY. 11/27/21   Donita Brooks, MD  co-enzyme Q-10 30 MG capsule Take 30 mg by mouth daily.     [provider]  losartan  (COZAAR) 50 MG tablet TAKE 1 TABLET IN THE MORNING AND AT BEDTIME. 05/06/23   Donita Brooks, MD  magnesium gluconate (MAGONATE) 500 MG tablet Take 500 mg by mouth daily.    [provider]  rosuvastatin (CRESTOR) 20 MG tablet TAKE 1 TABLET EVERY DAY 05/06/23   Donita Brooks, MD  UNABLE TO FIND Take by mouth.    [provider]  vitamin B-12 (CYANOCOBALAMIN) 1000 MCG tablet Take 1,000 mcg by mouth daily.    [provider]                                                                                                                                    Past Surgical History Past Surgical History:  Procedure Laterality Date   ABDOMINAL HYSTERECTOMY     LOWER EXTREMITY INTERVENTION N/A 09/02/2016   Procedure: Lower Extremity Intervention;  Surgeon: Runell Gess, MD;  Location: [facility] INVASIVE CV LAB;  Service: Cardiovascular;  Laterality: N/A;   TOE AMPUTATION     left 3rd distal to MTP (Dr. Logan Bores) due to chronic osteomyelitis and ischemia.    TONSILLECTOMY     Family History History reviewed. No pertinent family history.  Social History Social History   Tobacco Use   Smoking status: Never   Smokeless tobacco: Never  Vaping Use   Vaping status: Never Used  Substance Use Topics   Alcohol use: No   Drug use: No   Allergies Patient has no known allergies.  Review of Systems Review of Systems  Unable to perform ROS: Severe respiratory distress    Physical Exam Vital Signs  I have reviewed the triage vital signs BP 133/79   Pulse 76   Temp (!) 97.3 F (36.3 C) (Oral)   Resp 20   Ht 5\' 2"  (1.575 m)   Wt 53.7 kg   SpO2 100%   BMI 21.65 kg/m  Physical Exam Vitals and nursing note reviewed.  Constitutional:      General: She is in acute distress.     Appearance: She is ill-appearing and diaphoretic.  HENT:     Head: Normocephalic and atraumatic.     Mouth/Throat:     Mouth: Mucous membranes are moist.  Eyes:     Pupils: Pupils are equal,  round, and reactive to light.  Neck:     Vascular: JVD present.  Cardiovascular:     Rate and Rhythm: Regular rhythm. Tachycardia present.  Pulmonary:     Comments: Severe respiratory distress with accessory muscle use, pulmonary exam with diffuse crackles throughout all lung fields Musculoskeletal:     Cervical back: Neck supple.     Right lower leg: Edema present.     Left lower leg: Edema present.  Skin:    Capillary Refill: Capillary refill takes less than 2 seconds.  Neurological:     Comments: Follows commands but lethargic  Psychiatric:        Mood and Affect: Mood normal.        Behavior: Behavior normal.     ED Results and Treatments Labs (all labs ordered are listed, but only abnormal results are displayed) Labs Reviewed  CBC WITH DIFFERENTIAL/PLATELET - Abnormal; Notable for the following components:      Result Value   WBC 35.2 (*)    Hemoglobin 11.8 (*)    MCV 100.5 (*)    MCHC 28.2 (*)    RDW 19.9 (*)    Platelets 1,204 (*)    nRBC 11.9 (*)    Neutro Abs 19.7 (*)    Lymphs Abs 12.3 (*)    Monocytes Absolute 2.8 (*)    nRBC 8 (*)    All other components within normal limits  COMPREHENSIVE METABOLIC PANEL WITH GFR - Abnormal; Notable for the following components:   Glucose, Bld 241 (*)    AST 42 (*)    All other components within normal limits  I-STAT VENOUS BLOOD GAS, ED - Abnormal; Notable for the following components:   pH, Ven 7.183 (*)    pCO2, Ven 62.2 (*)    Acid-base deficit 6.0 (*)    All other components within normal limits  I-STAT CHEM 8, ED - Abnormal; Notable for the following components:   Glucose, Bld 227 (*)    All other components within normal limits  TROPONIN I (HIGH SENSITIVITY) - Abnormal; Notable for the following components:   Troponin I (High Sensitivity) 636 (*)    All other components within normal limits  CULTURE, BLOOD (ROUTINE X 2)  CULTURE, BLOOD (ROUTINE X 2)  CULTURE, BLOOD (ROUTINE X 2)  CULTURE, BLOOD (ROUTINE X  2)  CULTURE, RESPIRATORY W GRAM STAIN  BRAIN NATRIURETIC PEPTIDE  CBC  CREATININE, SERUM  PROTIME-INR  APTT  STREP PNEUMONIAE URINARY ANTIGEN  LEGIONELLA PNEUMOPHILA SEROGP 1 UR AG  BLOOD GAS, ARTERIAL  HEMOGLOBIN A1C  I-STAT CG4 LACTIC ACID, ED                                                                                                                          Radiology No results found.  Pertinent labs & imaging results that were available during my care of the patient were reviewed by me and considered in my medical decision making (see MDM for details).  Medications Ordered in ED Medications  ipratropium-albuterol (DUONEB) 0.5-2.5 (3) MG/3ML nebulizer solution 3 mL (0 mLs Nebulization Hold 06/27/23 1218)  nitroGLYCERIN (NITROSTAT) SL tablet 0.4 mg (0.4 mg Sublingual Given 06/27/23 1152)  etomidate (AMIDATE) injection (15 mg Intravenous Given 06/27/23 1156)  rocuronium (ZEMURON) injection (70 mg Intravenous Given 06/27/23 1157)  cefTRIAXone (ROCEPHIN) 2 g in sodium chloride 0.9 % 100 mL IVPB (2 g Intravenous New Bag/Given 06/27/23 1306)  azithromycin (ZITHROMAX) 500 mg in sodium chloride 0.9 % 250 mL IVPB (has no administration in time range)  aspirin tablet 325 mg (has no administration in time range)  rosuvastatin (CRESTOR) tablet 20 mg (has no administration in time range)  clopidogrel (PLAVIX) tablet 75 mg (has no administration in time range)  docusate (COLACE) 50 MG/5ML liquid 100 mg (has no administration in time range)  polyethylene glycol (MIRALAX / GLYCOLAX) packet 17 g (has no administration in time range)  heparin injection 5,000 Units (has no administration in time range)  0.9 %  sodium chloride infusion (has no administration in time range)  famotidine (PEPCID) tablet 20 mg (has no administration in time range)  ipratropium-albuterol (DUONEB) 0.5-2.5 (3) MG/3ML nebulizer solution 3 mL (has no administration in time range)  ipratropium-albuterol (DUONEB) 0.5-2.5 (3)  MG/3ML nebulizer solution 3 mL (has no administration in time range)  insulin aspart (novoLOG) injection 0-9 Units (has no administration in time range)  docusate (COLACE) 50 MG/5ML liquid 100 mg (has no administration in time range)  polyethylene glycol (MIRALAX / GLYCOLAX) packet 17 g (has no administration in time range)  propofol (DIPRIVAN) 1000 MG/100ML infusion (has no administration in time range)  fentaNYL (SUBLIMAZE) injection 25 mcg (has no administration in time range)  fentaNYL (SUBLIMAZE) injection 25-100 mcg (has no administration in time range)  ondansetron (ZOFRAN) injection 4 mg (4 mg Intravenous Given 06/27/23 1152)  furosemide (LASIX) injection 40 mg (40 mg Intravenous Given 06/27/23 1246)  Procedures .Critical Care  Performed by: Lonell Grandchild, MD Authorized by: Lonell Grandchild, MD   Critical care provider statement:    Critical care time (minutes):  30   Critical care was necessary to treat or prevent imminent or life-threatening deterioration of the following conditions:  Respiratory failure   Critical care was time spent personally by me on the following activities:  Development of treatment plan with patient or surrogate, discussions with consultants, evaluation of patient's response to treatment, examination of patient, ordering and review of laboratory studies, ordering and review of radiographic studies, ordering and performing treatments and interventions, pulse oximetry, re-evaluation of patient's condition and review of old charts Procedure Name: Intubation Date/Time: 06/27/2023 12:13 PM  Performed by: Lonell Grandchild, MDPre-anesthesia Checklist: Patient identified, Patient being monitored, Emergency Drugs available, Timeout performed and Suction available Oxygen Delivery Method: Ambu bag Preoxygenation: Pre-oxygenation  with 100% oxygen Induction Type: Rapid sequence Ventilation: Mask ventilation without difficulty Laryngoscope Size: Mac and 4 Grade View: Grade I Tube size: 7.5 mm Number of attempts: 1 Placement Confirmation: ETT inserted through vocal cords under direct vision, CO2 detector, Breath sounds checked- equal and bilateral and Positive ETCO2 Secured at: 23 cm Tube secured with: ETT holder      (including critical care time)  Medical Decision Making / ED Course   MDM:  83 year old presenting to the emergency department with shortness of breath.  Patient in respiratory distress.  Patient was hypoxic to the 70s on arrival.  She was placed on nonrebreather and improved to the low 80s.  Exam with JVD, diffuse crackles, lower extremity edema.  Seems most consistent with flash pulmonary edema but differential also includes pneumonia, ARDS, chest x-ray shows diffuse infiltrate throughout all lung fields.  Initially hoped to try BiPAP however patient began vomiting.  She also became more lethargic.  Intubation was discussed with the patient's niece and with the patient, patient able to nod that she would be okay with intubation.  Patient was able to be bagged to 100% prior to intubation, intubation with diffuse frothy secretions from larynx but otherwise uneventful.  VBG also shows some hypercapnia.  Will check labs including troponin, BNP, CBC.  Will obtain postintubation chest x-ray.  EKG has some ST depressions but no STEMI.  Will need admission to ICU.  Discussed with Cindee Lame with ICU who will consult and admit patient.  Clinical Course as of 06/27/23 1309  Fri Jun 27, 2023  1307 Labs notable for thrombocytosis and leukocytosis. Given antibiotics and cultures sent to cover for possible infection. ICU has admitted patient. Remains stable on ventilator.  [WS]    Clinical Course User Index [WS] Lonell Grandchild, MD     Additional history obtained: -Additional history obtained from family  and ems -External records from outside source obtained and reviewed including: Chart review including previous notes, labs, imaging, consultation notes including prior notes    Lab Tests: -I ordered, reviewed, and interpreted labs.   The pertinent results include:   Labs Reviewed  CBC WITH DIFFERENTIAL/PLATELET - Abnormal; Notable for the following components:      Result Value   WBC 35.2 (*)    Hemoglobin 11.8 (*)    MCV 100.5 (*)    MCHC 28.2 (*)    RDW 19.9 (*)    Platelets 1,204 (*)    nRBC 11.9 (*)    Neutro Abs 19.7 (*)    Lymphs Abs 12.3 (*)    Monocytes Absolute 2.8 (*)    nRBC  8 (*)    All other components within normal limits  COMPREHENSIVE METABOLIC PANEL WITH GFR - Abnormal; Notable for the following components:   Glucose, Bld 241 (*)    AST 42 (*)    All other components within normal limits  I-STAT VENOUS BLOOD GAS, ED - Abnormal; Notable for the following components:   pH, Ven 7.183 (*)    pCO2, Ven 62.2 (*)    Acid-base deficit 6.0 (*)    All other components within normal limits  I-STAT CHEM 8, ED - Abnormal; Notable for the following components:   Glucose, Bld 227 (*)    All other components within normal limits  TROPONIN I (HIGH SENSITIVITY) - Abnormal; Notable for the following components:   Troponin I (High Sensitivity) 636 (*)    All other components within normal limits  CULTURE, BLOOD (ROUTINE X 2)  CULTURE, BLOOD (ROUTINE X 2)  CULTURE, BLOOD (ROUTINE X 2)  CULTURE, BLOOD (ROUTINE X 2)  CULTURE, RESPIRATORY W GRAM STAIN  BRAIN NATRIURETIC PEPTIDE  CBC  CREATININE, SERUM  PROTIME-INR  APTT  STREP PNEUMONIAE URINARY ANTIGEN  LEGIONELLA PNEUMOPHILA SEROGP 1 UR AG  BLOOD GAS, ARTERIAL  HEMOGLOBIN A1C  I-STAT CG4 LACTIC ACID, ED    Notable for leukocytosis, resp acidoisis, hyperglycemia, elevated troponin  EKG   EKG Interpretation Date/Time:  Friday June 27 2023 11:28:59 EDT Ventricular Rate:  105 PR Interval:  135 QRS Duration:  85 QT  Interval:  317 QTC Calculation: 419 R Axis:   90  Text Interpretation: Sinus tachycardia Atrial premature complex Biatrial enlargement Borderline right axis deviation Probable LVH with secondary repol abnrm Anterior ST elevation, probably due to LVH Confirmed by Alvino Blood ([id number]) on 06/27/2023 12:05:15 PM         Imaging Studies ordered: I ordered imaging studies including CXR On my interpretation imaging demonstrates pulmonary edema  I independently visualized and interpreted imaging. I agree with the radiologist interpretation   Medicines ordered and prescription drug management: Meds ordered this encounter  Medications   ipratropium-albuterol (DUONEB) 0.5-2.5 (3) MG/3ML nebulizer solution 3 mL   ondansetron (ZOFRAN) injection 4 mg   nitroGLYCERIN (NITROSTAT) SL tablet 0.4 mg   nitroGLYCERIN (NITROSTAT) 0.4 MG SL tablet    Chaplin, Krista M: cabinet override   ondansetron (ZOFRAN) 4 MG/2ML injection    Chaplin, Krista M: cabinet override   etomidate (AMIDATE) injection   rocuronium (ZEMURON) injection   DISCONTD: propofol (DIPRIVAN) 1000 MG/100ML infusion   furosemide (LASIX) injection 40 mg   cefTRIAXone (ROCEPHIN) 2 g in sodium chloride 0.9 % 100 mL IVPB    Antibiotic Indication::   CAP   azithromycin (ZITHROMAX) 500 mg in sodium chloride 0.9 % 250 mL IVPB   aspirin tablet 325 mg   rosuvastatin (CRESTOR) tablet 20 mg   clopidogrel (PLAVIX) tablet 75 mg   docusate (COLACE) 50 MG/5ML liquid 100 mg   polyethylene glycol (MIRALAX / GLYCOLAX) packet 17 g   heparin injection 5,000 Units   0.9 %  sodium chloride infusion   famotidine (PEPCID) tablet 20 mg   ipratropium-albuterol (DUONEB) 0.5-2.5 (3) MG/3ML nebulizer solution 3 mL   ipratropium-albuterol (DUONEB) 0.5-2.5 (3) MG/3ML nebulizer solution 3 mL   insulin aspart (novoLOG) injection 0-9 Units    Correction coverage::   Sensitive (thin, NPO, renal)    CBG < 70::   Implement Hypoglycemia Standing Orders and  refer to Hypoglycemia Standing Orders sidebar report    CBG 70 - 120::   0 units  CBG 121 - 150::   1 unit    CBG 151 - 200::   2 units    CBG 201 - 250::   3 units    CBG 251 - 300::   5 units    CBG 301 - 350::   7 units    CBG 351 - 400:   9 units    CBG > 400:   call MD and obtain STAT lab verification   docusate (COLACE) 50 MG/5ML liquid 100 mg   polyethylene glycol (MIRALAX / GLYCOLAX) packet 17 g   propofol (DIPRIVAN) 1000 MG/100ML infusion   fentaNYL (SUBLIMAZE) injection 25 mcg   fentaNYL (SUBLIMAZE) injection 25-100 mcg    -I have reviewed the patients home medicines and have made adjustments as needed   Consultations Obtained: I requested consultation with the ICU ,  and discussed lab and imaging findings as well as pertinent plan - they recommend: admission to ICU    Cardiac Monitoring: The patient was maintained on a cardiac monitor.  I personally viewed and interpreted the cardiac monitored which showed an underlying rhythm of: NSR  Reevaluation: After the interventions noted above, I reevaluated the patient and found that their symptoms have improved  Co morbidities that complicate the patient evaluation  Past Medical History:  Diagnosis Date   History of gastric ulcer    "after hysterectomy"   Hypertension    PVD (peripheral vascular disease) (HCC)    Ulcer       Dispostion: Disposition decision including need for hospitalization was considered, and patient admitted to the hospital.    Final Clinical Impression(s) / ED Diagnoses Final diagnoses:  Acute respiratory failure with hypoxia and hypercapnia (HCC)     This chart was dictated using voice recognition software.  Despite best efforts to proofread,  errors can occur which can change the documentation meaning.    Lonell Grandchild, MD 06/27/23 1309    Lonell Grandchild, MD 06/28/23 [id number]

## 2023-06-27 NOTE — Progress Notes (Signed)
 Came bedside for echo, but nurse states CT and transport to ICU must be done first.    Please contact echo department when patient is available for STAT echo.

## 2023-06-27 NOTE — H&P (Addendum)
 NAME:  Ann Powell, MRN:  [id number], DOB:  19-Jan-1941, LOS: 0 ADMISSION DATE:  06/27/2023, CONSULTATION DATE:  3/28 REFERRING MD:  acute resp failure , CHIEF COMPLAINT:  scheving   History of Present Illness:  83 year old female w/ hx as outlined below. Reportedly in [facility] when Presented to ER acutely on 3/28. Per Hx was walking to vehicle from bank suddenly became diaphoretic and short of breath.  On further questioning her niece tells me that the patient typically only leaves the house the last Friday of the month.  Her only family member is the niece here locally who takes care of her and has so since December.  The patient has had worsening activity tolerance since January 2025.  The niece reports that the patient says she has a history of "inflammatory fever" and on the events that she does leave the house she typically starts feeling poorly after minimal exertion and blames it on this "inflammatory fever".  The niece did recall after further questioning when they left the house today the patient reported "the fevers in my chest".  She could not clarify whether or not this meant chest pain reflux heartburn shortness of breath.  In ER speaking one word phrase, sats 70s, not much better w/ NRB,, vomited, got more lethargic, intubated by EDP  PCXR diffuse pulmonary edema Sbp 130s to 150s VBG resp acidosis  General ST depression   PCCM asked to admit   Pertinent  Medical History  Essential HTN, PVD, HLD, gastric ulcer, PVD on asa and plavix remote fempop bypass left lower ext  Significant Hospital Events: Including procedures, antibiotic start and stop dates in addition to other pertinent events   3/28 admitted. Resp failure working dx NSTEMI pulm edema +/- PNA   Interim History / Subjective:  She is now sedated on mechanical ventilator  Objective   Blood pressure 135/78, pulse (!) 126, temperature (!) 97.3 F (36.3 C), temperature source Oral, resp. rate (!) 21, height 5\' 2"  (1.575  m), weight 53.7 kg, SpO2 100%.    Vent Mode: PRVC FiO2 (%):  [100 %] 100 % Set Rate:  [20 bmp] 20 bmp Vt Set:  [400 mL] 400 mL PEEP:  [8 cmH20] 8 cmH20 Plateau Pressure:  [23 cmH20] 23 cmH20  No intake or output data in the 24 hours ending 06/27/23 1213 Filed Weights   06/27/23 1131  Weight: 53.7 kg    Examination: General: Chronically ill-appearing 83 year old female currently sedated on propofol HENT: Normocephalic atraumatic she does have some mild exophthalmia orally intubated no obvious JVD Lungs: Diffuse rales bilaterally portable chest x-ray showed endotracheal tube in satisfactory position with diffuse pulmonary edema Cardiovascular: Tachycardic regular rhythm Abdomen: Soft nontender Extremities: Warm chronic ulceration on the toes of the right foot, chronic venous stasis changes, pulses palpable Neuro: Sedated on vent GU: Due to void  Resolved Hospital Problem list     Assessment & Plan:  Acute hypoxic respiratory failure secondary to diffuse bilateral pulmonary infiltrates.  Favoring pulmonary edema but cannot rule out possible pneumonia at least as a contributing factor Plan She has received IV Lasix, will follow urine output Continue full ventilator support Scheduled bronchodilators Respiratory culture Urine antigens Respiratory viral panel Empiric hcap coverage  Mild lactic acidosis.  Favoring secondary to respiratory distress, not sepsis Plan Repeat lactate  NSTEMI  Twelve-lead EKG with nonspecific ST depression, initial troponin 636 Plan Admit to the intensive care Continue her aspirin via tube she was also on Plavix we will  continue this as well IV heparin Cycle cardiac enzymes Echocardiogram today Cardiology evaluation Probably needs beta-blockade but would like to see echo first CT angiogram ruling out dissection, low level of suspicion but with patient reporting "fever and chest" hard to differentiate if this is pain or  not  Hyperglycemia Plan Sending hemoglobin A1c Sliding scale insulin Glucose goal 140-180  Marked leukocytosis.  Question reactive versus an infectious Plan Cultures and antibiotics as above treating empirically for pneumonia  Thrombocytosis Plan Getting AC  Trend   J[employer]ov[employer] Plan No blood products  Best Practice (right click and "Reselect all SmartList Selections" daily)   Diet/type: NPO DVT prophylaxis systemic heparin Pressure ulcer(s): N/A GI prophylaxis: PPI Lines: N/A Foley:  N/A Code Status:  full code Last date of multidisciplinary goals of care discussion [pending]  Labs   CBC: Recent Labs  Lab 06/27/23 1147  HGB 13.6  13.9  HCT 40.0  41.0    Basic Metabolic Panel: Recent Labs  Lab 06/27/23 1147  NA 141  140  K 4.4  4.4  CL 107  GLUCOSE 227*  BUN 12  CREATININE 0.90   GFR: Estimated Creatinine Clearance: 38.8 mL/min (by C-G formula based on SCr of 0.9 mg/dL). No results for input(s): "PROCALCITON", "WBC", "LATICACIDVEN" in the last 168 hours.  Liver Function Tests: No results for input(s): "AST", "ALT", "ALKPHOS", "BILITOT", "PROT", "ALBUMIN" in the last 168 hours. No results for input(s): "LIPASE", "AMYLASE" in the last 168 hours. No results for input(s): "AMMONIA" in the last 168 hours.  ABG    Component Value Date/Time   HCO3 23.4 06/27/2023 1147   TCO2 25 06/27/2023 1147   TCO2 24 06/27/2023 1147   ACIDBASEDEF 6.0 (H) 06/27/2023 1147   O2SAT 63 06/27/2023 1147     Coagulation Profile: No results for input(s): "INR", "PROTIME" in the last 168 hours.  Cardiac Enzymes: No results for input(s): "CKTOTAL", "CKMB", "CKMBINDEX", "TROPONINI" in the last 168 hours.  HbA1C: No results found for: "HGBA1C"  CBG: No results for input(s): "GLUCAP" in the last 168 hours.  Review of Systems:   Not able   Past Medical History:  She,  has a past medical history of History of gastric ulcer, Hypertension, PVD (peripheral  vascular disease) (HCC), and Ulcer.   Surgical History:   Past Surgical History:  Procedure Laterality Date   ABDOMINAL HYSTERECTOMY     LOWER EXTREMITY INTERVENTION N/A 09/02/2016   Procedure: Lower Extremity Intervention;  Surgeon: Runell Gess, MD;  Location: [facility] INVASIVE CV LAB;  Service: Cardiovascular;  Laterality: N/A;   TOE AMPUTATION     left 3rd distal to MTP (Dr. Logan Bores) due to chronic osteomyelitis and ischemia.    TONSILLECTOMY       Social History:   reports that she has never smoked. She has never used smokeless tobacco. She reports that she does not drink alcohol and does not use drugs.   Family History:  Her family history is not on file.   Allergies No Known Allergies   Home Medications  Prior to Admission medications   Medication Sig Start Date End Date Taking? Authorizing Provider  aspirin 325 MG tablet Take 325 mg by mouth. 10/01/16   [provider]  carbonyl iron (FEOSOL) 45 MG TABS tablet Take 45 mg by mouth.    [provider]  cholecalciferol (VITAMIN D) 1000 units tablet Take 1,000 Units by mouth daily.    [provider]  clopidogrel (PLAVIX) 75 MG tablet TAKE 1 TABLET (  75 MG TOTAL) BY MOUTH DAILY. 11/27/21   Donita Brooks, MD  co-enzyme Q-10 30 MG capsule Take 30 mg by mouth daily.     [provider]  losartan (COZAAR) 50 MG tablet TAKE 1 TABLET IN THE MORNING AND AT BEDTIME. 05/06/23   Donita Brooks, MD  magnesium gluconate (MAGONATE) 500 MG tablet Take 500 mg by mouth daily.    [provider]  rosuvastatin (CRESTOR) 20 MG tablet TAKE 1 TABLET EVERY DAY 05/06/23   Donita Brooks, MD  UNABLE TO FIND Take by mouth.    [provider]  vitamin B-12 (CYANOCOBALAMIN) 1000 MCG tablet Take 1,000 mcg by mouth daily.    [provider]     Critical care time: 45 min

## 2023-06-27 NOTE — ED Triage Notes (Signed)
 Pt to ED via pov w/ family. Pt's family states pt was walking to vehicle from bank and became very sweaty and short of breath. Pt labored breathing in triage, unable to answer more than one word at a time, 02 = 71%. Per family pt does not wear oxygen at home.

## 2023-06-27 NOTE — Consult Note (Addendum)
 Cardiology Consultation   Patient ID: Meryn Sarracino MRN: [id number]; DOB: 10-17-1940  Admit date: 06/27/2023 Date of Consult: 06/27/2023  PCP:  Donita Brooks, MD   [facility] Providers Cardiologist:  None        Patient Profile:   Ann Powell is a 83 y.o. female with a hx of HTN, HLD, PVD who is being seen 06/27/2023 for the evaluation of Troponin elevation at the request of Dr. Delton Coombes.  History of Present Illness:   Ann Powell has no known cardiac history but has been followed for PAD by [facility] health care.   PV angiogram 09/02/16: critical limb ischemia with occluded left SFA, popliteal and tibial arteries. No endovascular or surgical revascularization options. Recommended aggressive local wound care   Last seen in office 09/10/2016 with Dr. Allyson Sabal for gangrenous changes of her left toes. She was referred to Dr. Hoy Finlay for revascularization limb. She has been lost to folllow up since then.    Presented to ED today for SOB and found to be in respiratory distress with O2 % 70's. She improved to low 80's with NRB. ED exam revealed JVD, diffuse crackles, diaphoresis, tachycardia and LE edema. Patient was put on Bipap with improvement but had to d/c due vomiting. Patient was bagged to 100% then successfully intubated with diffuse frothy secretions.  CMP notable for K 4.7, Glu elevated 241, Cr 0.84, AST elevated 42, ALT 17, GFR 60.  CBC notable for mild anemia with plt elevated to 1204, CBC elevated WBC 35.2 with elevated differentials including Neut 19.7, Lymphs abs 12.3, Mono 2.8, polychromasia, + tear drops, mild left shift.  hsTN 636 > 881; BNP 850, Lactic Acid 2.7  CXR: Widespread bilateral airspace consolidations, c/w  bilateral perihilar regions most likely  edema versus multifocal pneumonia. CTA: No evidence of PE. Bilateral opacities, dense consolidation, and diffuse ground glass opacities suspicious for multifocal PNA or ARDS. Extensive coronary artery  atherosclerosis with probable coronary artery stents. Small bilateral pleural effusions  EKG: Sinus tachycardia, HR, PAC, mild ST elevation in V1-2, mild depression inferior latera,  inverted T waves ECHO pending  Cardiology consulted for elevated troponin. On my interview, patient was intubated but awake and able to nod yes/no to questions. Patient reports SOB developed today suddenly while walking into the bank associated with diaphoresis and palpitations. Also reports orthopnea and unable to sleep laying flat. Denies any CP, dizziness, syncope. Denies any tobacco, etoh, or drug history. Denies any recent illness or being around anyone sick. Prior to today, patient was able to do house chores and take care of herself without any assistance. She lives with her niece who was not present during interview. She admits to seeing another cardiologist but unable to clarify exactly where, however denied ever having cardiac cath. Patient is willing to undergo cardiac cath if needed.   Past Medical History:  Diagnosis Date   History of gastric ulcer    "after hysterectomy"   Hypertension    PVD (peripheral vascular disease) (HCC)    Ulcer     Past Surgical History:  Procedure Laterality Date   ABDOMINAL HYSTERECTOMY     LOWER EXTREMITY INTERVENTION N/A 09/02/2016   Procedure: Lower Extremity Intervention;  Surgeon: Runell Gess, MD;  Location: [facility] INVASIVE CV LAB;  Service: Cardiovascular;  Laterality: N/A;   TOE AMPUTATION     left 3rd distal to MTP (Dr. Logan Bores) due to chronic osteomyelitis and ischemia.    TONSILLECTOMY  Home Medications:  Prior to Admission medications   Medication Sig Start Date End Date Taking? Authorizing Provider  aspirin 325 MG tablet Take 325 mg by mouth. 10/01/16   [provider]  carbonyl iron (FEOSOL) 45 MG TABS tablet Take 45 mg by mouth.    [provider]  cholecalciferol (VITAMIN D) 1000 units tablet Take 1,000 Units by mouth daily.     [provider]  clopidogrel (PLAVIX) 75 MG tablet TAKE 1 TABLET (75 MG TOTAL) BY MOUTH DAILY. 11/27/21   Donita Brooks, MD  co-enzyme Q-10 30 MG capsule Take 30 mg by mouth daily.     [provider]  losartan (COZAAR) 50 MG tablet TAKE 1 TABLET IN THE MORNING AND AT BEDTIME. 05/06/23   Donita Brooks, MD  magnesium gluconate (MAGONATE) 500 MG tablet Take 500 mg by mouth daily.    [provider]  rosuvastatin (CRESTOR) 20 MG tablet TAKE 1 TABLET EVERY DAY 05/06/23   Donita Brooks, MD  UNABLE TO FIND Take by mouth.    [provider]  vitamin B-12 (CYANOCOBALAMIN) 1000 MCG tablet Take 1,000 mcg by mouth daily.    [provider]    Inpatient Medications: Scheduled Meds:  aspirin  325 mg Per Tube Daily   Chlorhexidine Gluconate Cloth  6 each Topical Daily   clopidogrel  75 mg Per Tube Daily   docusate  100 mg Per Tube BID   famotidine  20 mg Per Tube Daily   insulin aspart  0-9 Units Subcutaneous Q4H   ipratropium-albuterol  3 mL Nebulization Once   ipratropium-albuterol  3 mL Nebulization Q6H   polyethylene glycol  17 g Per Tube Daily   rosuvastatin  20 mg Per Tube Daily   Continuous Infusions:  sodium chloride 40 mL/hr at 06/27/23 1441   [START ON 06/28/2023] azithromycin     [START ON 06/28/2023] cefTRIAXone (ROCEPHIN)  IV     heparin 650 Units/hr (06/27/23 1628)   propofol (DIPRIVAN) infusion 20 mcg/kg/min (06/27/23 1432)   PRN Meds: docusate, fentaNYL (SUBLIMAZE) injection, fentaNYL (SUBLIMAZE) injection, ipratropium-albuterol, nitroGLYCERIN, polyethylene glycol  Allergies:   No Known Allergies  Social History:   Social History   Socioeconomic History   Marital status: Single    Spouse name: Not on file   Number of children: Not on file   Years of education: Not on file   Highest education level: Not on file  Occupational History   Not on file  Tobacco Use   Smoking status: Never   Smokeless tobacco: Never  Vaping  Use   Vaping status: Never Used  Substance and Sexual Activity   Alcohol use: No   Drug use: No   Sexual activity: Never  Other Topics Concern   Not on file  Social History Narrative   Not on file   Social Drivers of Health   Financial Resource Strain: Low Risk  (01/16/2023)   Overall Financial Resource Strain (CARDIA)    Difficulty of Paying Living Expenses: Not hard at all  Food Insecurity: No Food Insecurity (01/16/2023)   Hunger Vital Sign    Worried About Radiation protection practitioner of Food in the Last Year: Never true    Ran Out of Food in the Last Year: Never true  Transportation Needs: No Transportation Needs (01/16/2023)   PRAPARE - Administrator, Civil Service (Medical): No    Lack of Transportation (Non-Medical): No  Physical Activity: Insufficiently Active (01/16/2023)   Exercise Vital Sign  Days of Exercise per Week: 3 days    Minutes of Exercise per Session: 30 min  Stress: No Stress Concern Present (01/16/2023)   [employer] of Occupational Health - Occupational Stress Questionnaire    Feeling of Stress : Not at all  Social Connections: Moderately Isolated (01/16/2023)   Social Connection and Isolation Panel [NHANES]    Frequency of Communication with Friends and Family: More than three times a week    Frequency of Social Gatherings with Friends and Family: Three times a week    Attends Religious Services: More than 4 times per year    Active Member of Clubs or Organizations: No    Attends Banker Meetings: Never    Marital Status: Never married  Intimate Partner Violence: Not At Risk (01/16/2023)   Humiliation, Afraid, Rape, and Kick questionnaire    Fear of Current or Ex-Partner: No    Emotionally Abused: No    Physically Abused: No    Sexually Abused: No    Family History:   History reviewed. No pertinent family history.   ROS:  Please see the history of present illness.  All other ROS reviewed and negative.     Physical  Exam/Data:   Vitals:   06/27/23 1245 06/27/23 1300 06/27/23 1417 06/27/23 1434  BP: 136/87 133/79    Pulse: (!) 110 76    Resp: 20 20    Temp:      TempSrc:      SpO2: 100% 100% 100%   Weight:    54 kg  Height:        Intake/Output Summary (Last 24 hours) at 06/27/2023 1710 Last data filed at 06/27/2023 1215 Gross per 24 hour  Intake --  Output 30 ml  Net -30 ml      06/27/2023    2:34 PM 06/27/2023   11:31 AM 03/04/2023   10:23 AM  Last 3 Weights  Weight (lbs) 119 lb 0.8 oz 118 lb 6.2 oz 118 lb 6.4 oz  Weight (kg) 54 kg 53.7 kg 53.706 kg     Body mass index is 21.77 kg/m.  General:  Laying in bed wit mild  acute distress with intubation but still awake;   HEENT: normal Neck: unable to assess Vascular: No carotid bruits; Distal pulses 2+ bilaterally Cardiac:  normal S1, S2; RRR; no murmur  Lungs:  anterior and lateral with diminished crackles . Unable to assess posteriorly  Abd: soft, nontender, no hepatomegaly  Ext: no edema Musculoskeletal:  No deformities, BUE and BLE strength normal and equal Skin: warm and dry  Neuro:  CNs 2-12 intact, no focal abnormalities noted Psych:  Normal affect   EKG:  The EKG was personally reviewed and demonstrates:  Sinus tachycardia, HR 105, PAC mild depression inferior lateral,  inverted T waves, LVH Telemetry:  Telemetry was personally reviewed and demonstrates:  Sinus Tachycardia, HR 100-110's  Relevant CV Studies: ECHO pending  Laboratory Data:  High Sensitivity Troponin:   Recent Labs  Lab 06/27/23 1142 06/27/23 1528  TROPONINIHS 636* 881*     Chemistry Recent Labs  Lab 06/27/23 1142 06/27/23 1147 06/27/23 1325  NA 140 141  140 141  K 4.7 4.4  4.4 3.3*  CL 108 107  --   CO2 23  --   --   GLUCOSE 241* 227*  --   BUN 11 12  --   CREATININE 0.84 0.90  --   CALCIUM 9.7  --   --   GFRNONAA >60  --   --  ANIONGAP 9  --   --     Recent Labs  Lab 06/27/23 1142  PROT 7.1  ALBUMIN 3.8  AST 42*  ALT 17   ALKPHOS 66  BILITOT 1.1   Lipids No results for input(s): "CHOL", "TRIG", "HDL", "LABVLDL", "LDLCALC", "CHOLHDL" in the last 168 hours.  Hematology Recent Labs  Lab 06/27/23 1142 06/27/23 1147 06/27/23 1325 06/27/23 1528  WBC 35.2*  --   --  27.5*  RBC 4.16  --   --  4.08  HGB 11.8* 13.6  13.9 12.2 11.7*  HCT 41.8 40.0  41.0 36.0 40.2  MCV 100.5*  --   --  98.5  MCH 28.4  --   --  28.7  MCHC 28.2*  --   --  29.1*  RDW 19.9*  --   --  19.7*  PLT 1,204*  --   --  723*   Thyroid No results for input(s): "TSH", "FREET4" in the last 168 hours.  BNP Recent Labs  Lab 06/27/23 1241  BNP 850.7*    DDimer No results for input(s): "DDIMER" in the last 168 hours.   Radiology/Studies:  CT ANGIO CHEST AORTA W/CM & OR WO/CM Result Date: 06/27/2023 CLINICAL DATA:  Shortness of breath. Acute aortic syndrome suspected. EXAM: CT ANGIOGRAPHY CHEST WITH CONTRAST TECHNIQUE: Multidetector CT imaging of the chest was performed using the standard protocol during bolus administration of intravenous contrast. Multiplanar CT image reconstructions and MIPs were obtained to evaluate the vascular anatomy. RADIATION DOSE REDUCTION: This exam was performed according to the departmental dose-optimization program which includes automated exposure control, adjustment of the mA and/or kV according to patient size and/or use of iterative reconstruction technique. CONTRAST:  75mL OMNIPAQUE IOHEXOL 350 MG/ML SOLN COMPARISON:  Chest radiographs same date and 08/23/2016. Abdominal CT 09/26/2016. FINDINGS: Cardiovascular: Pre contrast images were obtained and demonstrate no evidence intramural thrombus. Extensive coronary artery atherosclerosis with probable underlying coronary artery stents. Atherosclerosis of the aorta and great vessels. No evidence of aneurysm or dissection. The pulmonary arteries are well opacified with contrast. No evidence of acute pulmonary embolism. The heart size is normal. No significant  pericardial fluid. Along the right atrial appendage is a soft tissue nodule with peripheral calcifications measuring 1.9 x 1.3 cm on image 60/6. This is near the right coronary artery. Mediastinum/Nodes: There are no enlarged mediastinal, hilar or axillary lymph nodes.As above, partially calcified nodule adjacent to the right atrial appendage. Endotracheal tube tip in the mid trachea. Enteric tube projects below the diaphragm into the stomach. The thyroid gland, trachea and esophagus demonstrate no significant findings. Lungs/Pleura: Small bilateral pleural effusions. Extensive bilateral airspace opacities as seen on earlier radiographs. There is dense consolidation dependently in both lungs with additional ground-glass opacities more diffusely. No evidence of pneumothorax or endobronchial lesion. Upper abdomen: No acute findings are seen in the visualized upper abdomen. There is reflux of contrast into the IVC and hepatic veins. Musculoskeletal/Chest wall: There is no chest wall mass or suspicious osseous finding. Mild multilevel spondylosis. Review of the MIP images confirms the above findings. IMPRESSION: 1. No evidence of acute aortic syndrome or acute pulmonary embolism. 2. Extensive bilateral airspace opacities with dense consolidation dependently in both lungs and additional ground-glass opacities more diffusely. Findings are suspicious for multifocal pneumonia or ARDS. Correlate clinically. 3. Small bilateral pleural effusions. 4. Extensive coronary artery atherosclerosis with probable underlying coronary artery stents. 5. Indeterminate partially calcified soft tissue nodule adjacent to the right atrial appendage, not imaged on previous abdominal  CT. 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 Assessment limited by breathing artifact. Recommend attention on follow-up. 6.  Aortic Atherosclerosis (ICD10-I70.0). Electronically Signed   By: Carey Bullocks M.D.   On: 06/27/2023 15:16    DG Chest Portable 1 View Result Date: 06/27/2023 CLINICAL DATA:  Intubation. EXAM: PORTABLE CHEST 1 VIEW COMPARISON:  Same day. FINDINGS: Endotracheal and nasogastric tubes are in grossly good position. Stable bilateral lung opacities are noted concerning for pneumonia or edema. IMPRESSION: Endotracheal and nasogastric tubes are in grossly good position. Stable bilateral lung opacities. Electronically Signed   By: Lupita Raider M.D.   On: 06/27/2023 13:34   DG Abd Portable 1V Result Date: 06/27/2023 CLINICAL DATA:  Nasogastric tube placement. EXAM: PORTABLE ABDOMEN - 1 VIEW COMPARISON:  None Available. FINDINGS: The bowel gas pattern is normal. Distal tip of nasogastric tube is seen in expected position of distal stomach. No radio-opaque calculi or other significant radiographic abnormality are seen. IMPRESSION: Nasogastric tube tip seen in expected position of distal stomach. Electronically Signed   By: Lupita Raider M.D.   On: 06/27/2023 13:33   DG Chest Portable 1 View Result Date: 06/27/2023 CLINICAL DATA:  Shortness of breath EXAM: PORTABLE CHEST 1 VIEW COMPARISON:  08/23/2016 FINDINGS: No appreciable cardiomegaly although the cardiac silhouette is largely obscured. Aortic atherosclerosis. Widespread bilateral airspace consolidations, most confluent within the bilateral perihilar regions. No appreciable pleural fluid collection. No pneumothorax. IMPRESSION: Widespread bilateral airspace consolidations, most confluent within the bilateral perihilar regions. Findings may reflect pulmonary edema versus multifocal pneumonia. Electronically Signed   By: Duanne Guess D.O.   On: 06/27/2023 13:14     Assessment and Plan:   NSTEMI hsTN 636 > 881 - Trend troponins to peak - EKG: Sinus tachycardia, HR 105, PAC LVH with repolarization abnormalities - CTA suggestive of multifocal PNA or ARDs w/ Extensive coronary artery atherosclerosis  - Echo with EF 35-40% - Started on heparin  - Continued  on ASA, Plavix and statin from PAD hx  - Could represent demand ischemia in setting of her acute illness, but given very severe coronary calcifications on CT and regional WMA on echo, suspect likely does have underlying obstructive CAD.  Would manage medically for now, can consider ischemic evaluation once recovers from acute illness.    Acute systolic heart failure -Echo with EF 35-40% with regional wall motion abnormalities.  Plan ischemic evaluation once recovers from acute illness as above.  No room for GDMT since soft BP since intubated, can fold in as BP tolerates  Acute Hypoxic Respiratory Failure  - Suspected multifocal pneumonia but pulmonary edema may be contributing.  On antibiotics per PCCM.  Continue IV Lasix.  PAD  - Continue on ASA 325 mg, Plavix 75 mg, Crestor 20 mg  [employer]  - no blood products   Otherwise managed by primary  - Marked Leukocytosis - Thrombocytosis   Risk Assessment/Risk Scores:   TIMI Risk Score for Unstable Angina or Non-ST Elevation MI:   The patient's TIMI risk score is 4, which indicates a 20% risk of all cause mortality, new or recurrent myocardial infarction or need for urgent revascularization in the next 14 days.{  New York Heart Association (NYHA) Functional Class NYHA Class II      For questions or updates, please contact [facility] Please consult [email or web address] for contact info under    Signed, Basilio Cairo, PA-C  06/27/2023 5:10 PM   Patient seen and examined.  Agree with above  documentation.  Ann Powell is an 83 year old female with a history of PAD, hypertension, hyperlipidemia who were consulted by Dr. Delton Coombes for troponin elevation.  She previously followed with Dr. Allyson Sabal for PAD but has not been seen since 2018.  She presented to the ED today in respiratory distress.  Found to have significant hypoxia with SpO2 down to 70s.  Initial vital signs notable for BP 133/69, pulse 99, SpO2 70%.  Ultimately required  intubation.  Labs notable for creatinine 0.8, sodium 140, WBC 35, platelets 1200, hemoglobin 11.8, BNP 851, troponin 636>881, lactate 2.7. VBG showed 7.18, 62, 41.  CTA chest showed no acute aortic syndrome or acute PE but extensive bilateral airspace opacities suspicious for multifocal pneumonia or ARDS.  Also noted to have extensive coronary atherosclerosis, as well as a partially calcified soft tissue nodule adjacent to right atrial appendage that could represent a thrombosed coronary artery aneurysm.  EKG shows sinus tachycardia, rate 105, LVH with repolarization abnormalities.On exam, patient is intubated and sedated, regular rate and rhythm, no murmurs, mechanical breath sounds, trace LE edema.  For her troponin elevation, suspect likely demand ischemia in setting of acute illness; markedly elevated leukocytosis and CT concerning for multifocal pneumonia.  She does have extensive coronary calcifications and suspect she likely does have underlying obstructive CAD.  Can consider ischemic evaluation once recovers from acute illness.  In the meantime, would continue heparin drip.   She is on aspirin 325 mg daily and Plavix 75 mg daily, can decrease aspirin to 81 mg daily.  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  For her acute systolic heart failure, echo shows EF 35-40% with regional WMA.  No room to add GDMT at this time, as BP has been soft since intubated and sedated.  Will add GDMT as able.  Plan ischemic evaluation once recovers from acute illness. For acute hypoxic respiratory failure, suspect due to pneumonia but pulmonary edema could also be contributing.  Started on IV Lasix.  CRITICAL CARE TIME: I have spent a total of 35 minutes with patient reviewing hospital notes, telemetry, EKGs, labs and examining the patient as well as establishing an assessment and plan that was discussed with the patient's niece.  > 50% of time was spent in  direct patient care. The patient is critically ill with multi-organ system failure and requires high complexity decision making for assessment and support, frequent evaluation and titration of therapies, application of advanced monitoring technologies and extensive interpretation of multiple databases.   Little Ishikawa, MD

## 2023-06-28 ENCOUNTER — Inpatient Hospital Stay (HOSPITAL_COMMUNITY)

## 2023-06-28 DIAGNOSIS — E8729 Other acidosis: Secondary | ICD-10-CM | POA: Diagnosis not present

## 2023-06-28 DIAGNOSIS — I5021 Acute systolic (congestive) heart failure: Secondary | ICD-10-CM | POA: Diagnosis not present

## 2023-06-28 DIAGNOSIS — R739 Hyperglycemia, unspecified: Secondary | ICD-10-CM | POA: Diagnosis not present

## 2023-06-28 DIAGNOSIS — D72829 Elevated white blood cell count, unspecified: Secondary | ICD-10-CM

## 2023-06-28 DIAGNOSIS — J9601 Acute respiratory failure with hypoxia: Secondary | ICD-10-CM | POA: Diagnosis not present

## 2023-06-28 LAB — MAGNESIUM
Magnesium: 2 mg/dL (ref 1.7–2.4)
Magnesium: 2 mg/dL (ref 1.7–2.4)

## 2023-06-28 LAB — BASIC METABOLIC PANEL WITH GFR
Anion gap: 13 (ref 5–15)
BUN: 13 mg/dL (ref 8–23)
CO2: 21 mmol/L — ABNORMAL LOW (ref 22–32)
Calcium: 8.9 mg/dL (ref 8.9–10.3)
Chloride: 108 mmol/L (ref 98–111)
Creatinine, Ser: 0.95 mg/dL (ref 0.44–1.00)
GFR, Estimated: >60 mL/min (ref >60)
Glucose, Bld: 136 mg/dL — ABNORMAL HIGH (ref 70–99)
Potassium: 4.3 mmol/L (ref 3.5–5.1)
Sodium: 142 mmol/L (ref 135–145)

## 2023-06-28 LAB — HEPARIN LEVEL (UNFRACTIONATED)
Heparin Unfractionated: <0.1 [IU]/mL — ABNORMAL LOW (ref 0.30–0.70)
Heparin Unfractionated: 0.14 [IU]/mL — ABNORMAL LOW (ref 0.30–0.70)
Heparin Unfractionated: 0.23 [IU]/mL — ABNORMAL LOW (ref 0.30–0.70)
Heparin Unfractionated: 0.48 [IU]/mL (ref 0.30–0.70)

## 2023-06-28 LAB — GLUCOSE, CAPILLARY
Glucose-Capillary: 113 mg/dL — ABNORMAL HIGH (ref 70–99)
Glucose-Capillary: 102 mg/dL — ABNORMAL HIGH (ref 70–99)
Glucose-Capillary: 96 mg/dL (ref 70–99)
Glucose-Capillary: 104 mg/dL — ABNORMAL HIGH (ref 70–99)
Glucose-Capillary: 105 mg/dL — ABNORMAL HIGH (ref 70–99)
Glucose-Capillary: 108 mg/dL — ABNORMAL HIGH (ref 70–99)

## 2023-06-28 LAB — LACTIC ACID, PLASMA: Lactic Acid, Venous: 2.5 mmol/L (ref 0.5–1.9)

## 2023-06-28 LAB — BRAIN NATRIURETIC PEPTIDE
B Natriuretic Peptide: 876 pg/mL — ABNORMAL HIGH (ref 0.0–100.0)
B Natriuretic Peptide: 623.5 pg/mL — ABNORMAL HIGH (ref 0.0–100.0)

## 2023-06-28 LAB — TRIGLYCERIDES: Triglycerides: 48 mg/dL (ref <150)

## 2023-06-28 LAB — PROCALCITONIN: Procalcitonin: 0.7 ng/mL

## 2023-06-28 LAB — PHOSPHORUS: Phosphorus: 3.4 mg/dL (ref 2.5–4.6)

## 2023-06-28 MED ORDER — PHENTOLAMINE MESYLATE 5 MG IJ SOLR
5.0000 mg | Freq: Once | INTRAMUSCULAR | Status: AC
  Administered 2023-06-28: 5 mg via SUBCUTANEOUS
  Filled 2023-06-28: qty 5

## 2023-06-28 MED ORDER — VITAL HIGH PROTEIN PO LIQD
1000.0000 mL | ORAL | Status: DC
  Administered 2023-06-28: 1000 mL

## 2023-06-28 MED ORDER — ACETAMINOPHEN 160 MG/5ML PO SOLN
650.0000 mg | ORAL | Status: DC | PRN
  Administered 2023-06-28 (×2): 650 mg
  Filled 2023-06-28 (×3): qty 20.3

## 2023-06-28 MED ORDER — ORAL CARE MOUTH RINSE: 15.0000 mL | OROMUCOSAL | Status: DC | PRN

## 2023-06-28 MED ORDER — NITROGLYCERIN 2 % TD OINT
1.0000 [in_us] | TOPICAL_OINTMENT | Freq: Three times a day (TID) | TRANSDERMAL | Status: AC
  Administered 2023-06-28 – 2023-06-30 (×5): 1 [in_us] via TOPICAL
  Filled 2023-06-28: qty 30

## 2023-06-28 MED ORDER — HEPARIN BOLUS VIA INFUSION
1000.0000 [IU] | Freq: Once | INTRAVENOUS | Status: AC
  Administered 2023-06-28: 1000 [IU] via INTRAVENOUS
  Filled 2023-06-28: qty 1000

## 2023-06-28 MED ORDER — ORAL CARE MOUTH RINSE
15.0000 mL | OROMUCOSAL | Status: DC
  Administered 2023-06-28 – 2023-06-29 (×14): 15 mL via OROMUCOSAL

## 2023-06-28 MED ORDER — PROSOURCE TF20 ENFIT COMPATIBL EN LIQD
60.0000 mL | Freq: Every day | ENTERAL | Status: DC
  Administered 2023-06-28 – 2023-06-29 (×2): 60 mL
  Filled 2023-06-28 (×2): qty 60

## 2023-06-28 NOTE — Progress Notes (Signed)
 PHARMACY - ANTICOAGULATION CONSULT NOTE  Pharmacy Consult:  Heparin Indication: chest pain/ACS  No Known Allergies  Patient Measurements: Height: 5\' 2"  (157.5 cm) Weight: 54 kg (119 lb 0.8 oz) IBW/kg (Calculated) : 50.1 HEPARIN DW (KG): 53.7  Vital Signs: Temp: 99.9 F (37.7 C) (03/29 0130) Temp Source: Bladder (03/29 0000) BP: 109/69 (03/29 0130) Pulse Rate: 83 (03/29 0130)  Labs: Recent Labs    06/27/23 1142 06/27/23 1147 06/27/23 1325 06/27/23 1432 06/27/23 1528 06/27/23 1632 06/27/23 1635 06/27/23 2111 06/28/23 0103  HGB 11.8* 13.6  13.9 12.2  --  11.7*  --   --  9.2*  --   HCT 41.8 40.0  41.0 36.0  --  40.2  --   --  27.0*  --   PLT 1,204*  --   --   --  723*  --   --   --   --   APTT  --   --   --  28  --   --   --   --   --   LABPROT  --   --   --  14.1  --   --   --   --   --   INR  --   --   --  1.1  --   --   --   --   --   HEPARINUNFRC  --   --   --   --   --   --   --   --  <0.10*  CREATININE 0.84 0.90  --   --   --   --  0.94  --   --   TROPONINIHS 636*  --   --   --  881* 1,135*  --   --   --     Estimated Creatinine Clearance: 37.1 mL/min (by C-G formula based on SCr of 0.94 mg/dL).   Medical History: Past Medical History:  Diagnosis Date   History of gastric ulcer    "after hysterectomy"   Hypertension    PVD (peripheral vascular disease) (HCC)    Ulcer      Assessment: 41 YOF presented with SOB and was intubated in the ED.  Troponin is elevated and Pharmacy consulted to dose IV heparin.  H/H stable and platelet count is elevated.  No bleeding reported.  AM heparin level undetectable on 650 units/hr. Per RN, no issues with it running continuously or signs/symptoms of bleeding  Goal of Therapy:  Heparin level 0.3-0.7 units/ml Monitor platelets by anticoagulation protocol: Yes   Plan:  Heparin bolus 1000 units IV x1  Increase heparin gtt to 800 units/hr  Check 8 hr heparin level Daily heparin level and CBC  Arabella Merles,  PharmD. Clinical Pharmacist 06/28/2023 1:47 AM

## 2023-06-28 NOTE — Progress Notes (Signed)
 PHARMACY - ANTICOAGULATION CONSULT NOTE  Pharmacy Consult:  Heparin Indication: chest pain/ACS  No Known Allergies  Patient Measurements: Height: 5\' 2"  (157.5 cm) Weight: 54 kg (119 lb 0.8 oz) IBW/kg (Calculated) : 50.1 HEPARIN DW (KG): 53.7  Vital Signs: Temp: 100 F (37.8 C) (03/29 1945) BP: 114/73 (03/29 1945) Pulse Rate: 77 (03/29 1945)  Labs: Recent Labs    06/27/23 1142 06/27/23 1147 06/27/23 1325 06/27/23 1432 06/27/23 1528 06/27/23 1632 06/27/23 1635 06/27/23 2111 06/28/23 0103 06/28/23 0312 06/28/23 1045 06/28/23 1942  HGB 11.8* 13.6  13.9 12.2  --  11.7*  --   --  9.2*  --   --   --   --   HCT 41.8 40.0  41.0 36.0  --  40.2  --   --  27.0*  --   --   --   --   PLT 1,204*  --   --   --  723*  --   --   --   --   --   --   --   APTT  --   --   --  28  --   --   --   --   --   --   --   --   LABPROT  --   --   --  14.1  --   --   --   --   --   --   --   --   INR  --   --   --  1.1  --   --   --   --   --   --   --   --   HEPARINUNFRC  --   --   --   --   --   --   --   --    < > 0.14* 0.23* 0.48  CREATININE 0.84 0.90  --   --   --   --  0.94  --   --  0.95  --   --   TROPONINIHS 636*  --   --   --  881* 1,135*  --   --   --   --   --   --    < > = values in this interval not displayed.   Estimated Creatinine Clearance: 36.7 mL/min (by C-G formula based on SCr of 0.95 mg/dL).  Medical History: Past Medical History:  Diagnosis Date   History of gastric ulcer    "after hysterectomy"   Hypertension    PVD (peripheral vascular disease) (HCC)    Ulcer    Assessment: 83 YOF presented with SOB and was intubated in the ED.  Troponin uptrended following admission for 636 to 1135, TTE with EF 35-40%, AV sclerosis w/o evidence of stenosis. CTA negative for acute aortic syndrome or PE, however noted extensive coronary artery atherosclerosis w/ probable underlying coronary artery stents. Pharmacy consulted to dose IV heparin.  Heparin level therapeutic (0.48) on  infusion at 950 units/hr. No bleeding noted.  Goal of Therapy:  Heparin level 0.3-0.7 units/ml Monitor platelets by anticoagulation protocol: Yes   Plan:  Continue heparin 950 units/hr Will f/u daily heparin level to confirm  Christoper Fabian, PharmD, BCPS Please see amion for complete clinical pharmacist phone list 06/28/2023 8:08 PM

## 2023-06-28 NOTE — Progress Notes (Signed)
 PHARMACY - ANTICOAGULATION CONSULT NOTE  Pharmacy Consult:  Heparin Indication: chest pain/ACS  No Known Allergies  Patient Measurements: Height: 5\' 2"  (157.5 cm) Weight: 54 kg (119 lb 0.8 oz) IBW/kg (Calculated) : 50.1 HEPARIN DW (KG): 53.7  Vital Signs: Temp: 101.3 F (38.5 C) (03/29 1059) Temp Source: Bladder (03/29 0000) BP: 111/68 (03/29 0800) Pulse Rate: 78 (03/29 1059)  Labs: Recent Labs    06/27/23 1142 06/27/23 1147 06/27/23 1325 06/27/23 1432 06/27/23 1528 06/27/23 1632 06/27/23 1635 06/27/23 2111 06/28/23 0103 06/28/23 0312 06/28/23 1045  HGB 11.8* 13.6  13.9 12.2  --  11.7*  --   --  9.2*  --   --   --   HCT 41.8 40.0  41.0 36.0  --  40.2  --   --  27.0*  --   --   --   PLT 1,204*  --   --   --  723*  --   --   --   --   --   --   APTT  --   --   --  28  --   --   --   --   --   --   --   LABPROT  --   --   --  14.1  --   --   --   --   --   --   --   INR  --   --   --  1.1  --   --   --   --   --   --   --   HEPARINUNFRC  --   --   --   --   --   --   --   --  <0.10* 0.14* 0.23*  CREATININE 0.84 0.90  --   --   --   --  0.94  --   --  0.95  --   TROPONINIHS 636*  --   --   --  881* 1,135*  --   --   --   --   --    Estimated Creatinine Clearance: 36.7 mL/min (by C-G formula based on SCr of 0.95 mg/dL).  Medical History: Past Medical History:  Diagnosis Date   History of gastric ulcer    "after hysterectomy"   Hypertension    PVD (peripheral vascular disease) (HCC)    Ulcer    Assessment: 24 YOF presented with SOB and was intubated in the ED.  Troponin uptrended following admission for 636 to 1135, TTE with EF 35-40%, AV sclerosis w/o evidence of stenosis. CTA negative for acute aortic syndrome or PE, however noted extensive coronary artery atherosclerosis w/ probable underlying coronary artery stents. Pharmacy consulted to dose IV heparin.  Repeat heparin level improved to 0.23 from 0.14 after increase by 150 u/hr. Per RN no issues with  infusion, no new s/sx bleeding.   Goal of Therapy:  Heparin level 0.3-0.7 units/ml Monitor platelets by anticoagulation protocol: Yes   Plan:  Increase heparin gtt to 950 units/hr  Check 8 hr heparin level Daily heparin level and CBC F/u duration of anticoagulation, ACS workup  Rutherford Nail, PharmD PGY2 Critical Care Pharmacy Resident 06/28/2023 11:10 AM

## 2023-06-28 NOTE — Progress Notes (Signed)
 NAME:  Ann Powell, MRN:  [id number], DOB:  01/17/1941, LOS: 1 ADMISSION DATE:  06/27/2023, CONSULTATION DATE:  3/28 REFERRING MD:  acute resp failure , CHIEF COMPLAINT:  scheving   History of Present Illness:  83 year old female w/ hx as outlined below. Reportedly in USOH when Presented to ER acutely on 3/28. Per Hx was walking to vehicle from bank suddenly became diaphoretic and short of breath.  On further questioning her niece tells me that the patient typically only leaves the house the last Friday of the month.  Her only family member is the niece here locally who takes care of her and has so since December.  The patient has had worsening activity tolerance since January 2025.  The niece reports that the patient says she has a history of "inflammatory fever" and on the events that she does leave the house she typically starts feeling poorly after minimal exertion and blames it on this "inflammatory fever".  The niece did recall after further questioning when they left the house today the patient reported "the fevers in my chest".  She could not clarify whether or not this meant chest pain reflux heartburn shortness of breath.  In ER speaking one word phrase, sats 70s, not much better w/ NRB,, vomited, got more lethargic, intubated by EDP  PCXR diffuse pulmonary edema Sbp 130s to 150s VBG resp acidosis  General ST depression   PCCM asked to admit   Pertinent  Medical History  Essential HTN, PVD, HLD, gastric ulcer, PVD on asa and plavix remote fempop bypass left lower ext   Significant Hospital Events: Including procedures, antibiotic start and stop dates in addition to other pertinent events   3/28 admitted. Resp failure working dx NSTEMI pulm edema +/- PNA  3/29 no acute events overnight, remains on mechanical ventilation with low-dose Precedex  Interim History / Subjective:  Seen lying in bed on mechanical ventilation no acute distress, niece updated at bedside  Objective    Blood pressure (!) 88/51, pulse 78, temperature (!) 101.5 F (38.6 C), resp. rate (!) 24, height 5\' 2"  (1.575 m), weight 54 kg, SpO2 100%.    Vent Mode: PRVC FiO2 (%):  [40 %-100 %] 40 % Set Rate:  [20 bmp-24 bmp] 24 bmp Vt Set:  [400 mL] 400 mL PEEP:  [8 cmH20] 8 cmH20 Plateau Pressure:  [18 cmH20-23 cmH20] 18 cmH20   Intake/Output Summary (Last 24 hours) at 06/28/2023 1138 Last data filed at 06/28/2023 1100 Gross per 24 hour  Intake 1117.32 ml  Output 1930 ml  Net -812.68 ml   Filed Weights   06/27/23 1131 06/27/23 1434  Weight: 53.7 kg 54 kg    Examination: General: Acute on chronic ill-appearing deconditioned cachectic elderly female lying in bed on mechanical ventilation no acute distress HEENT: ETT, MM pink/moist, PERRL, kyphotic Neuro: Opens eyes to verbal command, simple commands CV: s1s2 regular rate and rhythm, no murmur, rubs, or gallops,  PULM: Clear to auscultation bilaterally, no increased work of breathing, no added breath sounds GI: soft, bowel sounds active in all 4 quadrants, non-tender, non-distended, tolerating  Extremities: warm/dry, no edema  Skin: no rashes or lesions  Resolved Hospital Problem list     Assessment & Plan:  Acute hypoxic respiratory failure secondary to diffuse bilateral pulmonary infiltrates.   -Favoring pulmonary edema but cannot rule out possible pneumonia at least as a contributing factor -RVP panel negative P: Continue ventilator support with lung protective strategies  Wean PEEP and FiO2  for sats greater than 90%. Head of bed elevated 30 degrees. Plateau pressures less than 30 cm H20.  Follow intermittent chest x-ray and ABG.   SAT/SBT as tolerated, mentation preclude extubation  Ensure adequate pulmonary hygiene  Follow cultures  VAP bundle in place  PAD protocol Check procalcitonin Continue empiric ceftriaxone and azithromycin  Mild lactic acidosis.  Favoring secondary to respiratory distress, not sepsis P: Repeat  lactic  NSTEMI  -Twelve-lead EKG with nonspecific ST depression, initial troponin 636 Extensive coronary artery atherosclerosis -Seen on CTA chest Acute systolic congestive heart failure -Echo with EF 35 to 40% with regional wall motion abnormality P: Cardiology following, appreciate assistance Continuous telemetry  Continue heparin drip x 48 hours Strict intake and output  Daily weight to assess volume status Daily assessment for need to diurese  Closely monitor renal function and electrolytes  GDMT when able  Hyperglycemia -Hemoglobin A1c 4.4 P: As needed SSI CBG goal 140-180  Marked leukocytosis -Question reactive versus an infectious P: Continue empiric treatment for community-acquired pneumonia Trend CBC Follow for need for hematology/oncology consult   Thrombocytosis -Platelet on admission 1204 down trended to 723 on 3/28 P: Anticoagulation as above Trend  Jehovah's Witness P: No blood products   Best Practice (right click and "Reselect all SmartList Selections" daily)   Diet/type: NPO DVT prophylaxis systemic heparin Pressure ulcer(s): N/A GI prophylaxis: PPI Lines: N/A Foley:  N/A Code Status:  full code Last date of multidisciplinary goals of care discussion [pending]  Critical care time:   CRITICAL CARE Performed by: Lisett Dirusso D. Harris   Total critical care time: 37 minutes  Critical care time was exclusive of separately billable procedures and treating other patients.  Critical care was necessary to treat or prevent imminent or life-threatening deterioration.  Critical care was time spent personally by me on the following activities: development of treatment plan with patient and/or surrogate as well as nursing, discussions with consultants, evaluation of patient's response to treatment, examination of patient, obtaining history from patient or surrogate, ordering and performing treatments and interventions, ordering and review of laboratory  studies, ordering and review of radiographic studies, pulse oximetry and re-evaluation of patient's condition.  Charvi Gammage D. Harris, NP-C [facility] Personal contact information can be found on Amion  If no contact or response made please call [phone] 06/28/2023, 11:44 AM

## 2023-06-28 NOTE — Progress Notes (Signed)
 Patient had ABG done earlier in shift WNL. 0500 ABG held at this time per Elink. No vent changes have been made since last ABG.  ABG    Component Value Date/Time   PHART 7.419 06/27/2023 2111   PCO2ART 37.9 06/27/2023 2111   PO2ART 143 (H) 06/27/2023 2111   HCO3 24.5 06/27/2023 2111   TCO2 26 06/27/2023 2111   ACIDBASEDEF 1.0 06/27/2023 1325   O2SAT 99 06/27/2023 2111

## 2023-06-28 NOTE — Plan of Care (Signed)
 Patient comfortable on minimal dose of Precedex.

## 2023-06-28 NOTE — Progress Notes (Signed)
 Chart reviewed, pt not seen.  Pt appears to remain febrile and hypotensive. Will follow. No new recommendations from cardiology today.   Parke Poisson, MD

## 2023-06-29 ENCOUNTER — Inpatient Hospital Stay (HOSPITAL_COMMUNITY)

## 2023-06-29 DIAGNOSIS — R739 Hyperglycemia, unspecified: Secondary | ICD-10-CM | POA: Diagnosis not present

## 2023-06-29 DIAGNOSIS — J9601 Acute respiratory failure with hypoxia: Secondary | ICD-10-CM | POA: Diagnosis not present

## 2023-06-29 DIAGNOSIS — I5021 Acute systolic (congestive) heart failure: Secondary | ICD-10-CM | POA: Diagnosis not present

## 2023-06-29 DIAGNOSIS — I214 Non-ST elevation (NSTEMI) myocardial infarction: Secondary | ICD-10-CM | POA: Diagnosis not present

## 2023-06-29 DIAGNOSIS — E8729 Other acidosis: Secondary | ICD-10-CM | POA: Diagnosis not present

## 2023-06-29 LAB — GLUCOSE, CAPILLARY
Glucose-Capillary: 122 mg/dL — ABNORMAL HIGH (ref 70–99)
Glucose-Capillary: 102 mg/dL — ABNORMAL HIGH (ref 70–99)
Glucose-Capillary: 94 mg/dL (ref 70–99)
Glucose-Capillary: 41 mg/dL — CL (ref 70–99)
Glucose-Capillary: 58 mg/dL — ABNORMAL LOW (ref 70–99)
Glucose-Capillary: 77 mg/dL (ref 70–99)
Glucose-Capillary: 88 mg/dL (ref 70–99)
Glucose-Capillary: 99 mg/dL (ref 70–99)

## 2023-06-29 LAB — BASIC METABOLIC PANEL WITH GFR
Anion gap: 16 — ABNORMAL HIGH (ref 5–15)
BUN: 24 mg/dL — ABNORMAL HIGH (ref 8–23)
CO2: 19 mmol/L — ABNORMAL LOW (ref 22–32)
Calcium: 8.7 mg/dL — ABNORMAL LOW (ref 8.9–10.3)
Chloride: 107 mmol/L (ref 98–111)
Creatinine, Ser: 0.9 mg/dL (ref 0.44–1.00)
GFR, Estimated: >60 mL/min (ref >60)
Glucose, Bld: 128 mg/dL — ABNORMAL HIGH (ref 70–99)
Potassium: 3.8 mmol/L (ref 3.5–5.1)
Sodium: 142 mmol/L (ref 135–145)

## 2023-06-29 LAB — CBC
HCT: 25.6 % — ABNORMAL LOW (ref 36.0–46.0)
Hemoglobin: 7.6 g/dL — ABNORMAL LOW (ref 12.0–15.0)
MCH: 28.6 pg (ref 26.0–34.0)
MCHC: 29.7 g/dL — ABNORMAL LOW (ref 30.0–36.0)
MCV: 96.2 fL (ref 80.0–100.0)
Platelets: 476 10*3/uL — ABNORMAL HIGH (ref 150–400)
RBC: 2.66 MIL/uL — ABNORMAL LOW (ref 3.87–5.11)
RDW: 19.7 % — ABNORMAL HIGH (ref 11.5–15.5)
WBC: 9 10*3/uL (ref 4.0–10.5)
nRBC: 1.5 % — ABNORMAL HIGH (ref 0.0–0.2)

## 2023-06-29 LAB — LEGIONELLA PNEUMOPHILA SEROGP 1 UR AG
L. pneumophila Serogp 1 Ur Ag: NEGATIVE
Source of Sample: 9985

## 2023-06-29 LAB — PHOSPHORUS: Phosphorus: 2.8 mg/dL (ref 2.5–4.6)

## 2023-06-29 LAB — BRAIN NATRIURETIC PEPTIDE: B Natriuretic Peptide: 429.3 pg/mL — ABNORMAL HIGH (ref 0.0–100.0)

## 2023-06-29 LAB — HEPARIN LEVEL (UNFRACTIONATED): Heparin Unfractionated: 0.46 [IU]/mL (ref 0.30–0.70)

## 2023-06-29 LAB — MAGNESIUM: Magnesium: 2 mg/dL (ref 1.7–2.4)

## 2023-06-29 MED ORDER — DEXTROSE 50 % IV SOLN
50.0000 mL | Freq: Once | INTRAVENOUS | Status: AC
  Administered 2023-06-29: 50 mL via INTRAVENOUS

## 2023-06-29 MED ORDER — FAMOTIDINE 20 MG PO TABS
20.0000 mg | ORAL_TABLET | Freq: Every day | ORAL | Status: DC
  Administered 2023-06-30 – 2023-07-07 (×8): 20 mg via ORAL
  Filled 2023-06-29 (×8): qty 1

## 2023-06-29 MED ORDER — ENSURE ENLIVE PO LIQD
237.0000 mL | Freq: Two times a day (BID) | ORAL | Status: DC
  Administered 2023-06-29 – 2023-07-07 (×13): 237 mL via ORAL

## 2023-06-29 MED ORDER — CLOPIDOGREL BISULFATE 75 MG PO TABS
75.0000 mg | ORAL_TABLET | Freq: Every day | ORAL | Status: DC
  Administered 2023-06-30 – 2023-07-07 (×8): 75 mg via ORAL
  Filled 2023-06-29 (×7): qty 1

## 2023-06-29 MED ORDER — FUROSEMIDE 10 MG/ML IJ SOLN
40.0000 mg | Freq: Two times a day (BID) | INTRAMUSCULAR | Status: DC
  Administered 2023-06-29 – 2023-07-01 (×5): 40 mg via INTRAVENOUS
  Filled 2023-06-29 (×7): qty 4

## 2023-06-29 MED ORDER — POLYETHYLENE GLYCOL 3350 17 G PO PACK
17.0000 g | PACK | Freq: Every day | ORAL | Status: DC
  Filled 2023-06-29 (×6): qty 1

## 2023-06-29 MED ORDER — DEXTROSE 50 % IV SOLN
INTRAVENOUS | Status: AC
  Filled 2023-06-29: qty 50

## 2023-06-29 MED ORDER — DOCUSATE SODIUM 100 MG PO CAPS
100.0000 mg | ORAL_CAPSULE | Freq: Two times a day (BID) | ORAL | Status: DC
  Administered 2023-07-03 – 2023-07-06 (×6): 100 mg via ORAL
  Filled 2023-06-29 (×12): qty 1

## 2023-06-29 MED ORDER — INSULIN ASPART 100 UNIT/ML IJ SOLN: 0.0000 [IU] | Freq: Every day | INTRAMUSCULAR | Status: DC

## 2023-06-29 MED ORDER — ADULT MULTIVITAMIN W/MINERALS CH
1.0000 | ORAL_TABLET | Freq: Every day | ORAL | Status: DC
  Administered 2023-06-29 – 2023-07-07 (×9): 1 via ORAL
  Filled 2023-06-29 (×9): qty 1

## 2023-06-29 MED ORDER — DOCUSATE SODIUM 50 MG/5ML PO LIQD
100.0000 mg | Freq: Two times a day (BID) | ORAL | Status: DC
  Filled 2023-06-29 (×2): qty 10

## 2023-06-29 MED ORDER — ASPIRIN 81 MG PO CHEW
81.0000 mg | CHEWABLE_TABLET | Freq: Every day | ORAL | Status: DC
  Administered 2023-06-30 – 2023-07-07 (×8): 81 mg via ORAL
  Filled 2023-06-29 (×8): qty 1

## 2023-06-29 MED ORDER — INSULIN ASPART 100 UNIT/ML IJ SOLN
0.0000 [IU] | Freq: Three times a day (TID) | INTRAMUSCULAR | Status: DC
  Administered 2023-06-30: 1 [IU] via SUBCUTANEOUS

## 2023-06-29 MED ORDER — ACETAMINOPHEN 325 MG PO TABS
650.0000 mg | ORAL_TABLET | ORAL | Status: DC | PRN
  Administered 2023-06-29: 325 mg via ORAL
  Administered 2023-06-30 – 2023-07-02 (×6): 650 mg via ORAL
  Filled 2023-06-29 (×7): qty 2

## 2023-06-29 MED ORDER — HEPARIN SODIUM (PORCINE) 5000 UNIT/ML IJ SOLN
5000.0000 [IU] | Freq: Three times a day (TID) | INTRAMUSCULAR | Status: DC
  Administered 2023-06-29 – 2023-07-07 (×21): 5000 [IU] via SUBCUTANEOUS
  Filled 2023-06-29 (×19): qty 1

## 2023-06-29 NOTE — Progress Notes (Signed)
   Patient Name: Ann Powell Date of Encounter: 06/29/2023 [facility] Cardiologist: None   Interval Summary  .    83 year old female with history of PAD followed at [facility], also hypertension, hyperlipidemia on whom cardiology was consulted for troponin elevation.  Initial presentation for respiratory distress found to be hypoxic with SpO2 down into the 70s, ultimately requiring intubation and mechanical ventilation.  Lactic acidosis.  She has extensive coronary atherosclerosis on CT and echocardiogram demonstrates reduced ejection fraction approximately 40% with regional wall motion abnormalities.  Initial consult felt this was most likely demand ischemia in the setting of acute illness, with a markedly elevated leukocytosis and CT concerning for multifocal pneumonia.  Critical care evaluation suggest there is likely a component of cardiogenic edema.  Heparin is plan to continue for a total of 48 hours and then will stop.  She is also on aspirin which we recommended decreasing to 81 mg daily and Plavix 75 mg daily with a history of PAD.  She was hypotensive and has been supported with norepinephrine, no room for GDMT at this time.  Norepinephrine has been paused at this time, BNP improving, vent weaning attempted.  Receiving diuretic today 40 mg IV twice daily.  Troponin increased from [phone].  She is now extubated, and communicates easily. No complaints. Breathing comfortably on [place].  Vital Signs .    Vitals:   06/29/23 0930 06/29/23 1000 06/29/23 1030 06/29/23 1133  BP: (!) 106/59 104/61 124/67 125/73  Pulse: 71 74 78   Resp: 19 19 18    Temp: 99.3 F (37.4 C) 99.1 F (37.3 C) 99 F (37.2 C)   TempSrc:      SpO2: 100% 100% 100%   Weight:      Height:        Intake/Output Summary (Last 24 hours) at 06/29/2023 1308 Last data filed at 06/29/2023 1200 Gross per 24 hour  Intake 1726.84 ml  Output 1085 ml  Net 641.84 ml      06/27/2023    2:34 PM 06/27/2023   11:31 AM  03/04/2023   10:23 AM  Last 3 Weights  Weight (lbs) 119 lb 0.8 oz 118 lb 6.2 oz 118 lb 6.4 oz  Weight (kg) 54 kg 53.7 kg 53.706 kg      Telemetry/ECG    SR - Personally Reviewed  Physical Exam .   GEN: NAD Neck: JVP to lower 1/3 of neck. Severe cervical kyphosis. Cardiac: RRR, no murmurs, rubs, or gallops.  Respiratory: Clear to auscultation bilaterally. GI: Soft, nontender, non-distended  MS: No edema  Assessment & Plan .     #NSTEMI #HFrEF, acute #CAC, suspect ischemia -diuresing 40 mg IV BID lasix - heparin IV to end today after 48 hr therapy.  - no room for GDMT yet, will follow - eventual ischemic eval when she recovers.  -now extubated, discussed with patient and family in room that we will observe for recovery and if stable from respiratory standpoint will consider ischemic eval. For now continue diuresis.  #Anemic -downtrending, per primary service. May limit possibility of interventions if anemia persists.  For questions or updates, please contact [facility] Please consult [email or web address] for contact info under        Signed, Parke Poisson, MD

## 2023-06-29 NOTE — Progress Notes (Signed)
 PHARMACY - ANTICOAGULATION CONSULT NOTE  Pharmacy Consult:  Heparin Indication: chest pain/ACS  No Known Allergies  Patient Measurements: Height: 5\' 2"  (157.5 cm) Weight: 54 kg (119 lb 0.8 oz) IBW/kg (Calculated) : 50.1 HEPARIN DW (KG): 53.7  Vital Signs: Temp: 99.9 F (37.7 C) (03/30 0755) Temp Source: Bladder (03/30 0400) BP: 111/64 (03/30 0700) Pulse Rate: 71 (03/30 0755)  Labs: Recent Labs    06/27/23 1142 06/27/23 1147 06/27/23 1325 06/27/23 1432 06/27/23 1528 06/27/23 1632 06/27/23 1635 06/27/23 2111 06/28/23 0103 06/28/23 0312 06/28/23 1045 06/28/23 1942 06/29/23 0506  HGB 11.8* 13.6  13.9   < >  --  11.7*  --   --  9.2*  --   --   --   --  7.6*  HCT 41.8 40.0  41.0   < >  --  40.2  --   --  27.0*  --   --   --   --  25.6*  PLT 1,204*  --   --   --  723*  --   --   --   --   --   --   --  476*  APTT  --   --   --  28  --   --   --   --   --   --   --   --   --   LABPROT  --   --   --  14.1  --   --   --   --   --   --   --   --   --   INR  --   --   --  1.1  --   --   --   --   --   --   --   --   --   HEPARINUNFRC  --   --   --   --   --   --   --   --    < > 0.14* 0.23* 0.48 0.46  CREATININE 0.84 0.90  --   --   --   --  0.94  --   --  0.95  --   --   --   TROPONINIHS 636*  --   --   --  881* 1,135*  --   --   --   --   --   --   --    < > = values in this interval not displayed.   Estimated Creatinine Clearance: 36.7 mL/min (by C-G formula based on SCr of 0.95 mg/dL).  Medical History: Past Medical History:  Diagnosis Date   History of gastric ulcer    "after hysterectomy"   Hypertension    PVD (peripheral vascular disease) (HCC)    Ulcer    Assessment: 6 YOF presented with SOB and was intubated in the ED.  Troponin uptrended following admission for 636 to 1135, TTE with EF 35-40%, AV sclerosis w/o evidence of stenosis. CTA negative for acute aortic syndrome or PE, however noted extensive coronary artery atherosclerosis w/ probable underlying  coronary artery stents. Pharmacy consulted to dose IV heparin.  Heparin level 0.46 therapeutic on infusion at 950 units/hr. No note of additional bleeding.   Goal of Therapy:  Heparin level 0.3-0.7 units/ml Monitor platelets by anticoagulation protocol: Yes   Plan:  Continue heparin 950 units/hr Daily heparin level, CBC, additional labs as needed 48 hours heparin gtt then f/u DVT PPX (on DAPT w/  ASA/plavix)  Rutherford Nail, PharmD PGY2 Critical Care Pharmacy Resident 06/29/2023 8:02 AM

## 2023-06-29 NOTE — Progress Notes (Signed)
 NAME:  Ann Powell, MRN:  [id number], DOB:  November 05, 1940, LOS: 2 ADMISSION DATE:  06/27/2023, CONSULTATION DATE:  3/28 REFERRING MD:  acute resp failure , CHIEF COMPLAINT:  scheving   History of Present Illness:  83 year old female w/ hx as outlined below. Reportedly in USOH when Presented to ER acutely on 3/28. Per Hx was [place] from [place] suddenly became diaphoretic and short of breath.  On further questioning her niece tells me that the patient typically only leaves the house the last Friday of the month.  Her only family member is the niece here locally who takes care of her and has so since December.  The patient has had worsening activity tolerance since January 2025.  The niece reports that the patient says she has a history of "inflammatory fever" and on the events that she does leave the house she typically starts feeling poorly after minimal exertion and blames it on this "inflammatory fever".  The niece did recall after further questioning when they left the house today the patient reported "the fevers in my chest".  She could not clarify whether or not this meant chest pain reflux heartburn shortness of breath.  In ER speaking one word phrase, sats 70s, not much better w/ NRB,, vomited, got more lethargic, intubated by EDP  PCXR diffuse pulmonary edema Sbp 130s to 150s VBG resp acidosis  General ST depression   PCCM asked to admit   Pertinent  Medical History  Essential HTN, PVD, HLD, gastric ulcer, PVD on asa and plavix remote fempop bypass left lower ext   Significant Hospital Events: Including procedures, antibiotic start and stop dates in addition to other pertinent events   3/28 admitted. Resp failure working dx NSTEMI pulm edema +/- PNA  3/29 no acute events overnight, remains on mechanical ventilation with low-dose Precedex  Interim History / Subjective:  I/O even 0.40, PEEP 5 Precedex 0.5 Norepinephrine off BNP 876 >> 429  Objective   Blood pressure  111/64, pulse 71, temperature 99.9 F (37.7 C), resp. rate 13, height 5\' 2"  (1.575 m), weight 54 kg, SpO2 100%.    Vent Mode: PSV;CPAP FiO2 (%):  [40 %] 40 % Set Rate:  [24 bmp] 24 bmp Vt Set:  [400 mL] 400 mL PEEP:  [5 cmH20-8 cmH20] 5 cmH20 Pressure Support:  [8 cmH20] 8 cmH20 Plateau Pressure:  [18 cmH20] 18 cmH20   Intake/Output Summary (Last 24 hours) at 06/29/2023 0818 Last data filed at 06/29/2023 0600 Gross per 24 hour  Intake 1319.43 ml  Output 615 ml  Net 704.43 ml   Filed Weights   06/27/23 1131 06/27/23 1434  Weight: 53.7 kg 54 kg    Examination: General: Thin kyphotic woman mechanically ventilated HEENT: Eyes open, ET tube in place, no secretions Neuro: Awake, alert, nods to questions, follows commands CV: Distant, regular, no murmur PULM: Clear.  No crackles or wheezes GI: Nondistended with positive bowel sounds Extremities: No edema Skin: No rash  Resolved Hospital Problem list     Assessment & Plan:  Acute hypoxic respiratory failure secondary to diffuse bilateral pulmonary infiltrates.   -Favoring pulmonary edema but cannot rule out possible pneumonia at least as a contributing factor -RVP panel negative P: -PRVC 8 cc/kg, wean PEEP and FiO2. -Push for spontaneous breathing, wake up 3/30 -Repeat chest x-ray 3/30 -Continue diuresis, did not receive on 3/29 -Pulmonary hygiene -VAP prevention orders -Sedation as per PAD protocol -Continue empiric ceftriaxone and azithromycin, procalcitonin 0.7  Mild lactic  acidosis.   P: -Repeat lactic acid 3/31  NSTEMI  -Twelve-lead EKG with nonspecific ST depression, initial troponin 636 Extensive coronary artery atherosclerosis -Seen on CTA chest Acute systolic congestive heart failure -Echo with EF 35 to 40% with regional wall motion abnormality P: -Appreciate cardiology assistance -Heparin infusion will and this afternoon, then transition to subcutaneous prophylaxis dosing -Diuresis as she can  tolerate -Blood pressure improving slowly, initiate goal-directed therapy once fully stabilized  Hyperglycemia -Hemoglobin A1c 4.4 P: -Sliding scale insulin as ordered -CBG goal 140-180  Marked leukocytosis, improved -Question reactive versus an infectious P: -Continue empiric treatment for possible CAP -Follow CBC intermittently  Thrombocytosis -Platelet on admission 1204, downtrending P: -Anticoagulation as above -Follow CBC -Depending on trend consider outpatient hematology evaluation  Jehovah's Witness P: -No blood products   Best Practice (right click and "Reselect all SmartList Selections" daily)   Diet/type: NPO DVT prophylaxis systemic heparin Pressure ulcer(s): N/A GI prophylaxis: PPI Lines: N/A Foley:  N/A Code Status:  full code Last date of multidisciplinary goals of care discussion [pending]  Critical care time:   CRITICAL CARE Performed by: Leslye Peer  Total critical care time: 33 minutes  Levy Pupa, MD, PhD 06/29/2023, 8:18 AM [facility] [phone] or if no answer before 7:00PM call [phone] For any issues after 7:00PM please call eLink [phone]

## 2023-06-29 NOTE — Procedures (Signed)
 Extubation Procedure Note  Patient Details:   Name: Ann Powell DOB: June 07, 1940 MRN: [id number]   Airway Documentation:    Vent end date: 06/29/23 Vent end time: 1328   Evaluation  O2 sats: stable throughout Complications: No apparent complications Patient did tolerate procedure well. Bilateral Breath Sounds: Clear, Diminished   Yes  Positive cuff leak prior to extubation. Pt on 4L [place].  Lajean Manes 06/29/2023, 1:28 PM

## 2023-06-29 NOTE — Progress Notes (Signed)
 Initial Nutrition Assessment  DOCUMENTATION CODES:   Not applicable  INTERVENTION:  Multivitamin w/ minerals daily Ensure Enlive po BID, each supplement provides 350 kcal and 20 grams of protein. Encourage good PO intake  NUTRITION DIAGNOSIS:   Increased nutrient needs related to acute illness as evidenced by estimated needs.  GOAL:   Patient will meet greater than or equal to 90% of their needs  MONITOR:   Weight trends, Labs, PO intake, Supplement acceptance  REASON FOR ASSESSMENT:   Consult Enteral/tube feeding initiation and management  ASSESSMENT:   83 y.o. female presented to the ED with SOB and diaphoretic. PMH includes GERD, HTN, PAD, and gastric ulcer. Pt admitted with acute hypoxic respiratory failure and NSTEMI.   3/28 - Admitted; Intubated 3/30 - Extubated; diet advanced to Heart Healthy  RD working remotely at time of assessment.  Pt recently extubated. Discussed with RN, pt just passed her [facility] swallow screen. RN able to advance pt diet.   Per EMR, pt with a 4.5 kg or 7.7% weight loss within 5 months.    Medications reviewed and include: Colace, Pepcid, Lasix, NovoLog SSI, Miralax, IV antibiotics  Labs reviewed: Sodium 142, Potassium 3.8, BUN 24, Creatinine 0.90, Phosphorus 2.8, Magnesium 2.0, Hgb A1c 4.4  CBG: 94-122 x 24 hrs  NUTRITION - FOCUSED PHYSICAL EXAM:  Deferred to follow-up.   Diet Order:   Diet Order             Diet Heart Room service appropriate? Yes; Fluid consistency: Thin  Diet effective now                   EDUCATION NEEDS:   Not appropriate for education at this time  Skin:  Skin Assessment: Reviewed RN Assessment  Last BM:  3/28  Height:  Ht Readings from Last 1 Encounters:  06/27/23 5\' 2"  (1.575 m)   Weight:  Wt Readings from Last 1 Encounters:  06/27/23 54 kg   Ideal Body Weight:  50 kg  BMI:  Body mass index is 21.77 kg/m.  Estimated Nutritional Needs:  Kcal:  1500-1700 Protein:  75-95  grams Fluid:  >/= 1.5 L   Kirby Crigler RD, LDN Clinical Dietitian

## 2023-06-29 NOTE — Progress Notes (Signed)
 Hypoglycemic Event  CBG: 41  Treatment: 8 oz juice/soda  Symptoms: None  Follow-up CBG: Time:1655 CBG Result:58  Possible Reasons for Event: Other: stopped TF for extubation   Comments/MD notified: Janeann Forehand, NP     Sharalyn Ink

## 2023-06-30 ENCOUNTER — Inpatient Hospital Stay (HOSPITAL_COMMUNITY)

## 2023-06-30 DIAGNOSIS — I214 Non-ST elevation (NSTEMI) myocardial infarction: Secondary | ICD-10-CM | POA: Diagnosis not present

## 2023-06-30 DIAGNOSIS — D638 Anemia in other chronic diseases classified elsewhere: Secondary | ICD-10-CM | POA: Diagnosis not present

## 2023-06-30 DIAGNOSIS — J9601 Acute respiratory failure with hypoxia: Secondary | ICD-10-CM | POA: Diagnosis not present

## 2023-06-30 DIAGNOSIS — I5021 Acute systolic (congestive) heart failure: Secondary | ICD-10-CM | POA: Diagnosis not present

## 2023-06-30 DIAGNOSIS — I4891 Unspecified atrial fibrillation: Secondary | ICD-10-CM | POA: Diagnosis not present

## 2023-06-30 LAB — BASIC METABOLIC PANEL WITH GFR
Anion gap: 13 (ref 5–15)
Anion gap: 11 (ref 5–15)
BUN: 22 mg/dL (ref 8–23)
BUN: 18 mg/dL (ref 8–23)
CO2: 23 mmol/L (ref 22–32)
CO2: 28 mmol/L (ref 22–32)
Calcium: 8.8 mg/dL — ABNORMAL LOW (ref 8.9–10.3)
Calcium: 9.2 mg/dL (ref 8.9–10.3)
Chloride: 104 mmol/L (ref 98–111)
Chloride: 102 mmol/L (ref 98–111)
Creatinine, Ser: 0.89 mg/dL (ref 0.44–1.00)
Creatinine, Ser: 0.86 mg/dL (ref 0.44–1.00)
GFR, Estimated: >60 mL/min (ref >60)
GFR, Estimated: >60 mL/min (ref >60)
Glucose, Bld: 120 mg/dL — ABNORMAL HIGH (ref 70–99)
Glucose, Bld: 119 mg/dL — ABNORMAL HIGH (ref 70–99)
Potassium: 3.7 mmol/L (ref 3.5–5.1)
Potassium: 3.1 mmol/L — ABNORMAL LOW (ref 3.5–5.1)
Sodium: 140 mmol/L (ref 135–145)
Sodium: 141 mmol/L (ref 135–145)

## 2023-06-30 LAB — CBC
HCT: 33.4 % — ABNORMAL LOW (ref 36.0–46.0)
Hemoglobin: 9.9 g/dL — ABNORMAL LOW (ref 12.0–15.0)
MCH: 28.3 pg (ref 26.0–34.0)
MCHC: 29.6 g/dL — ABNORMAL LOW (ref 30.0–36.0)
MCV: 95.4 fL (ref 80.0–100.0)
Platelets: 703 10*3/uL — ABNORMAL HIGH (ref 150–400)
RBC: 3.5 MIL/uL — ABNORMAL LOW (ref 3.87–5.11)
RDW: 19.8 % — ABNORMAL HIGH (ref 11.5–15.5)
WBC: 13.2 10*3/uL — ABNORMAL HIGH (ref 4.0–10.5)
nRBC: 3.5 % — ABNORMAL HIGH (ref 0.0–0.2)

## 2023-06-30 LAB — GLUCOSE, CAPILLARY
Glucose-Capillary: 126 mg/dL — ABNORMAL HIGH (ref 70–99)
Glucose-Capillary: 128 mg/dL — ABNORMAL HIGH (ref 70–99)
Glucose-Capillary: 71 mg/dL (ref 70–99)
Glucose-Capillary: 109 mg/dL — ABNORMAL HIGH (ref 70–99)

## 2023-06-30 LAB — BRAIN NATRIURETIC PEPTIDE: B Natriuretic Peptide: 1102.1 pg/mL — ABNORMAL HIGH (ref 0.0–100.0)

## 2023-06-30 LAB — PATHOLOGIST SMEAR REVIEW: Path Review: INCREASED

## 2023-06-30 LAB — TROPONIN I (HIGH SENSITIVITY)
Troponin I (High Sensitivity): 182 ng/L (ref <18)
Troponin I (High Sensitivity): 461 ng/L (ref <18)

## 2023-06-30 LAB — MAGNESIUM: Magnesium: 2 mg/dL (ref 1.7–2.4)

## 2023-06-30 LAB — HEPARIN LEVEL (UNFRACTIONATED): Heparin Unfractionated: <0.1 [IU]/mL — ABNORMAL LOW (ref 0.30–0.70)

## 2023-06-30 MED ORDER — ORAL CARE MOUTH RINSE: 15.0000 mL | OROMUCOSAL | Status: DC | PRN

## 2023-06-30 MED ORDER — AMIODARONE HCL IN DEXTROSE 360-4.14 MG/200ML-% IV SOLN
30.0000 mg/h | INTRAVENOUS | Status: DC
  Administered 2023-06-30: 30 mg/h via INTRAVENOUS

## 2023-06-30 MED ORDER — FUROSEMIDE 10 MG/ML IJ SOLN
40.0000 mg | Freq: Once | INTRAMUSCULAR | Status: AC
  Administered 2023-06-30: 40 mg via INTRAVENOUS

## 2023-06-30 MED ORDER — ROSUVASTATIN CALCIUM 20 MG PO TABS
20.0000 mg | ORAL_TABLET | Freq: Every day | ORAL | Status: DC
  Administered 2023-07-01 – 2023-07-07 (×7): 20 mg via ORAL
  Filled 2023-06-30 (×7): qty 1

## 2023-06-30 MED ORDER — AMIODARONE LOAD VIA INFUSION
150.0000 mg | Freq: Once | INTRAVENOUS | Status: AC
  Administered 2023-06-30: 150 mg via INTRAVENOUS
  Filled 2023-06-30: qty 83.34

## 2023-06-30 MED ORDER — AMIODARONE HCL 200 MG PO TABS
200.0000 mg | ORAL_TABLET | Freq: Every day | ORAL | Status: DC
  Administered 2023-06-30 – 2023-07-01 (×2): 200 mg via ORAL
  Filled 2023-06-30 (×2): qty 1

## 2023-06-30 MED ORDER — AMIODARONE HCL IN DEXTROSE 360-4.14 MG/200ML-% IV SOLN
60.0000 mg/h | INTRAVENOUS | Status: AC
  Administered 2023-06-30 (×2): 60 mg/h via INTRAVENOUS
  Filled 2023-06-30: qty 200

## 2023-06-30 MED ORDER — POTASSIUM CHLORIDE 10 MEQ/100ML IV SOLN
10.0000 meq | INTRAVENOUS | Status: AC
  Administered 2023-06-30 – 2023-07-01 (×3): 10 meq via INTRAVENOUS
  Filled 2023-06-30 (×3): qty 100

## 2023-06-30 MED ORDER — PANTOPRAZOLE SODIUM 40 MG IV SOLR
40.0000 mg | Freq: Once | INTRAVENOUS | Status: AC
Start: 2023-06-30 — End: 2023-06-30
  Administered 2023-06-30: 40 mg via INTRAVENOUS

## 2023-06-30 MED ORDER — MAGNESIUM SULFATE 2 GM/50ML IV SOLN
2.0000 g | Freq: Once | INTRAVENOUS | Status: AC
Start: 2023-06-30 — End: 2023-06-30
  Administered 2023-06-30: 2 g via INTRAVENOUS
  Filled 2023-06-30: qty 50

## 2023-06-30 MED ORDER — ALUM & MAG HYDROXIDE-SIMETH 200-200-20 MG/5ML PO SUSP
30.0000 mL | Freq: Four times a day (QID) | ORAL | Status: DC | PRN
  Administered 2023-06-30 (×2): 30 mL via ORAL
  Filled 2023-06-30 (×2): qty 30

## 2023-06-30 NOTE — Progress Notes (Addendum)
 eLink Physician-Brief Progress Note Patient Name: Ann Powell DOB: 01/21/1941 MRN: [id number]   Date of Service  06/30/2023  HPI/Events of Note  83 year old female with no prior cardiac history who was admitted with respiratory distress and found to have a non-ST elevation MI for which she is being followed by cardiology.  Echocardiogram revealed global hypokinesis with EF of 35 to 40%.  Was on anticoagulation till this afternoon.  Has developed new onset A-fib with RVR with heart rate in the 130s.  Hemodynamically stable.  No chest pain.  eICU Interventions  Patient's chart reviewed.  Pertinent labs and diagnostic studies reviewed.  Case discussed with RN.  Will start patient on amiodarone protocol.  Obtain serial troponins to see if this is related to a new ischemic event.  Will follow-up after labs results.     Intervention Category Major Interventions: Arrhythmia - evaluation and management  Addendum: Complaining of heartburn.  Unsure if this is related to A-fib, new cardiac event or if this is just heartburn.  In any case, we will order a dose of Maalox to see if her symptoms improve.  Still awaiting cardiac enzymes to be resulted.  Follow-up: Heartburn was relieved with Maalox.  On amiodarone drip, patient has converted to normal sinus rhythm.  Still on the infusion at 1 mg/min.  Initial troponin resulted at 182 which is down from greater than 8000.  Overall, patient is doing better.  Will complete amiodarone protocol.  Discussed with RN.  Ann Powell 06/30/2023, 1:07 AM

## 2023-06-30 NOTE — Progress Notes (Signed)
 Date and time results received: 06/30/23 0342   Test: Troponin Critical Value: 182  Name of Provider Notified: Pola Corn MD  Cranford Mon

## 2023-06-30 NOTE — Plan of Care (Signed)
  Problem: Fluid Volume: Goal: Ability to maintain a balanced intake and output will improve Outcome: Progressing   Problem: Metabolic: Goal: Ability to maintain appropriate glucose levels will improve Outcome: Progressing   Problem: Nutritional: Goal: Maintenance of adequate nutrition will improve Outcome: Progressing   Problem: Health Behavior/Discharge Planning: Goal: Ability to identify and utilize available resources and services will improve Outcome: Progressing

## 2023-06-30 NOTE — Progress Notes (Signed)
 Patient's HR sustaining 150's-160's, monitor reading Afib. This RN obtained an EKG and notified MD of change in rhythm. Starting Amio drip per MD order.   Cranford Mon

## 2023-06-30 NOTE — TOC CM/SW Note (Signed)
 Transition of Care [facility]) - Inpatient Brief Assessment   Patient Details  Name: Ann Powell MRN: [id number] Date of Birth: Oct 08, 1940  Transition of [facility]) CM/SW Contact:    Tom-Johnson, Hershal Coria, RN Phone Number: 06/30/2023, 2:48 PM   Clinical Narrative:  Patient presented to the ED with Shortness of Breath while walking to her [place]. Admitted with Acute Resp Failure. Was intubated, extubated yesterday, currently on 5L O2 acute. Does not use home O2. Cardiology following for elevated Troponin. On IV Heparin  for Chest pain/ACS.   From home with niece, Duwayne Heck, does not have children. Duwayne Heck is caregiver. Has three siblings. Has a cane and walker at home. Independent with care.  PCP is Donita Brooks, MD and uses [facility] on [address] and also [employer].   No TOC needs or recommendations noted at this time.  Patient not Medically ready for discharge.  CM will continue to follow as patient progresses with care towards discharge.           Transition of Care Asessment: Insurance and Status: Insurance coverage has been reviewed Patient has primary care physician: Yes Home environment has been reviewed: Yes Prior level of function:: Independent Prior/Current Home Services: No current home services Social Drivers of Health Review: SDOH reviewed no interventions necessary Readmission risk has been reviewed: Yes Transition of care needs: no transition of care needs at this time

## 2023-06-30 NOTE — Progress Notes (Addendum)
 Patient Name: Ann Powell Date of Encounter: 06/30/2023 [facility] Cardiologist: None   Interval Summary  .    83 year old female with history of PAD followed at [facility], also hypertension, hyperlipidemia on whom cardiology was consulted for troponin elevation.  Initial presentation for respiratory distress found to be hypoxic with SpO2 down into the 70s, ultimately requiring intubation and mechanical ventilation.  Lactic acidosis.  She has extensive coronary atherosclerosis on CT and echocardiogram demonstrates reduced ejection fraction approximately 40% with regional wall motion abnormalities.  Initial consult felt this was most likely demand ischemia in the setting of acute illness, with a markedly elevated leukocytosis and CT concerning for multifocal pneumonia.  Critical care evaluation suggest there is likely a component of cardiogenic edema.  Heparin is plan to continue for a total of 48 hours and then will stop.  She is also on aspirin which we recommended decreasing to 81 mg daily and Plavix 75 mg daily with a history of PAD.  She was hypotensive and has been supported with norepinephrine, no room for GDMT at this time.  Norepinephrine has been paused at this time, BNP improving, vent weaning attempted.  Receiving diuretic today 40 mg IV twice daily.  Troponin increased from [phone].  She is now extubated, and communicates easily. No complaints. Breathing comfortably on [place]. However, had atrial arrhythmia last evening. Looks mostly like atrial runs but did have one episode suggestive of atrial fibrillation. Hb downtrending, need to identify source of anemia.   Vital Signs .    Vitals:   06/30/23 0600 06/30/23 0700 06/30/23 0800 06/30/23 0900  BP: (!) 100/58 109/62 118/74 124/73  Pulse: 89 88 100 91  Resp: (!) 25 (!) 21 16 (!) 30  Temp: (!) 100.4 F (38 C) (!) 100.4 F (38 C) (!) 100.6 F (38.1 C) 100 F (37.8 C)  TempSrc:      SpO2: 98% 95% 92% (!) 89%  Weight:       Height:        Intake/Output Summary (Last 24 hours) at 06/30/2023 1035 Last data filed at 06/30/2023 0900 Gross per 24 hour  Intake 539.47 ml  Output 2060 ml  Net -1520.53 ml      06/30/2023    4:21 AM 06/27/2023    2:34 PM 06/27/2023   11:31 AM  Last 3 Weights  Weight (lbs) 119 lb 14.9 oz 119 lb 0.8 oz 118 lb 6.2 oz  Weight (kg) 54.4 kg 54 kg 53.7 kg      Telemetry/ECG    SR - Personally Reviewed  Physical Exam .   GEN: NAD Neck: JVP to lower 1/3 of neck. Severe cervical kyphosis. Cardiac: RRR, no murmurs, rubs, or gallops.  Respiratory: Clear to auscultation bilaterally. GI: Soft, nontender, non-distended  MS: No edema  Assessment & Plan .     #NSTEMI #HFrEF, acute #CAC, suspect ischemia -diuresing 40 mg IV BID lasix - heparin IV 48 hr therapy completed yesterday.  - no room for GDMT yet, will follow - eventual ischemic eval when she recovers.  -now extubated, discussed with patient and family in room that we will observe for recovery and if stable from respiratory standpoint will consider ischemic eval. For now continue diuresis lasix 40 mg IV BID.  #Anemic -downtrending, per primary service. May limit possibility of interventions if anemia persists. CBC pending.  #possible atrial fibrillation -troponin elevated with chest pain during episode. Suspect ongoing ischemia with heavy CAC on CT. However, with anemia and jehovah's witness  who will not receive blood products, need to manage conservatively for now. Ischemic workup only if no bleeding source and Hb normalizes. - ok to continue IV amiodarone for now, can transition to oral whenever patient is reliably taking PO. Amio 200 mg po Bid for loading when switched to oral.   CRITICAL CARE Performed by: Weston Brass, MD   Total critical care time: 35 minutes   Critical care time was exclusive of separately billable procedures and treating other patients.   Critical care was necessary to treat or prevent  imminent or life-threatening deterioration.   Critical care was time spent personally by me (independent of APPs or residents) on the following activities: development of treatment plan with patient and/or surrogate as well as nursing, discussions with consultants, evaluation of patient's response to treatment, examination of patient, obtaining history from patient or surrogate, ordering and performing treatments and interventions, ordering and review of laboratory studies, ordering and review of radiographic studies, pulse oximetry and re-evaluation of patient's condition.   For questions or updates, please contact [facility] Please consult [email or web address] for contact info under        Signed, Parke Poisson, MD

## 2023-06-30 NOTE — Progress Notes (Addendum)
 eLink Physician-Brief Progress Note Patient Name: Nicole Cella May Rozycki DOB: 1940/08/03 MRN: [id number]   Date of Service  06/30/2023  HPI/Events of Note  Patient feeling unwell. RN assisted patient to bathroom and she large BM/diarrhea. Returning to bed she was tachypneic and hypoxemic. Placed on [place] and NRB. Transitioned to BiPAP.   On camera check SBP 190s and HR 110s. CXR this AM with bibasilar infiltrates and small bilateral pleural effusions. Last received lasix at 8am. Due for evening dose now. Cr wnl  eICU Interventions  Give lasix now STAT CXR > increased pulmonary edema, bilateral pleural effusions Continue BiPAP   10:00 PM On camera recheck appears comfortable on BiPAP. BP 102/63 HR 92 NSR  Intervention Category Intermediate Interventions: Respiratory distress - evaluation and management  Taris Galindo Mechele Collin 06/30/2023, 8:28 PM

## 2023-06-30 NOTE — Evaluation (Signed)
 Physical Therapy Evaluation Patient Details Name: Ann Powell MRN: [id number] DOB: Sep 01, 1940 Today's Date: 06/30/2023  History of Present Illness  Pt is 83 year old presented to [facility] on  06/27/23 for respiratory failure, NSTEMI, and pulmonary edema. Intubated in ED. Extubated 06/29/23.  PMH - PAD, HTN  Clinical Impression  Pt admitted with above diagnosis and presents to PT with functional limitations due to deficits listed below (See PT problem list). Pt needs skilled PT to maximize independence and safety. Pt moving fairly well and is typically active around the house. Expect she will make steady progress back toward baseline.           If plan is discharge home, recommend the following: Assistance with cooking/housework;Help with stairs or ramp for entrance;Assist for transportation;A little help with bathing/dressing/bathroom;A little help with walking and/or transfers   Can travel by private vehicle        Equipment Recommendations None recommended by PT  Recommendations for Other Services       Functional Status Assessment Patient has had a recent decline in their functional status and demonstrates the ability to make significant improvements in function in a reasonable and predictable amount of time.     Precautions / Restrictions Precautions Precautions: Fall Recall of Precautions/Restrictions: Intact Restrictions Weight Bearing Restrictions Per Provider Order: No      Mobility  Bed Mobility Overal bed mobility: Needs Assistance Bed Mobility: Supine to Sit     Supine to sit: Contact guard     General bed mobility comments: Assist for safety and lines    Transfers Overall transfer level: Needs assistance Equipment used: Rolling walker (2 wheels) Transfers: Sit to/from Stand, Bed to chair/wheelchair/BSC Sit to Stand: Contact guard assist   Step pivot transfers: Contact guard assist       General transfer comment: Assist for safety and lines     Ambulation/Gait Ambulation/Gait assistance: Contact guard assist Gait Distance (Feet): 120 Feet Assistive device: Rolling walker (2 wheels) Gait Pattern/deviations: Step-through pattern, Decreased stride length, Trunk flexed Gait velocity: decr Gait velocity interpretation: 1.31 - 2.62 ft/sec, indicative of limited community ambulator   General Gait Details: Assist for safety  Stairs            Wheelchair Mobility     Tilt Bed    Modified [place] (Stroke Patients Only)       Balance Overall balance assessment: Needs assistance, History of Falls Sitting-balance support: No upper extremity supported, Feet supported Sitting balance-Leahy Scale: Good     Standing balance support: No upper extremity supported, During functional activity Standing balance-Leahy Scale: Fair                               Pertinent Vitals/Pain Pain Assessment Pain Assessment: No/denies pain    Home Living Family/patient expects to be discharged to:: Private residence Living Arrangements: Other relatives (niece) Available Help at Discharge: Family;Available 24 hours/day Type of Home: House Home Access: Stairs to enter Entrance Stairs-Rails: None Entrance Stairs-Number of Steps: 2   Home Layout: One level Home Equipment: Agricultural consultant (2 wheels);Rollator (4 wheels);Cane - single point      Prior Function Prior Level of Function : Independent/Modified Independent;History of Falls (last six months)             Mobility Comments: No assistive device. Helps gather wood for wood stove       Extremity/Trunk Assessment   Upper Extremity Assessment Upper Extremity Assessment:  Defer to OT evaluation    Lower Extremity Assessment Lower Extremity Assessment: Generalized weakness    Cervical / Trunk Assessment Cervical / Trunk Assessment: Kyphotic  Communication   Communication Communication: Impaired Factors Affecting Communication: Hearing impaired     Cognition Arousal: Alert Behavior During Therapy: WFL for tasks assessed/performed   PT - Cognitive impairments: No apparent impairments                         Following commands: Intact       Cueing Cueing Techniques: Verbal cues     General Comments General comments (skin integrity, edema, etc.): Pt on 6L O2 with SpO2 98% at rest and >93% with amb    Exercises     Assessment/Plan    PT Assessment Patient needs continued PT services  PT Problem List Decreased strength;Decreased balance;Decreased mobility       PT Treatment Interventions DME instruction;Gait training;Stair training;Functional mobility training;Therapeutic activities;Therapeutic exercise;Balance training;Patient/family education    PT Goals (Current goals can be found in the Care Plan section)  Acute Rehab PT Goals Patient Stated Goal: go home PT Goal Formulation: With patient/family Time For Goal Achievement: 07/14/23 Potential to Achieve Goals: Good    Frequency Min 2X/week     Co-evaluation               AM-PAC PT "6 Clicks" Mobility  Outcome Measure Help needed turning from your back to your side while in a flat bed without using bedrails?: A Little Help needed moving from lying on your back to sitting on the side of a flat bed without using bedrails?: A Little Help needed moving to and from a bed to a chair (including a wheelchair)?: A Little Help needed standing up from a chair using your arms (e.g., wheelchair or bedside chair)?: A Little Help needed to walk in hospital room?: A Little Help needed climbing 3-5 steps with a railing? : A Little 6 Click Score: 18    End of Session Equipment Utilized During Treatment: Oxygen Activity Tolerance: Patient tolerated treatment well Patient left: in chair;with call bell/phone within reach;with family/visitor present;with chair alarm set Nurse Communication: Mobility status PT Visit Diagnosis: Other abnormalities of gait and  mobility (R26.89);Muscle weakness (generalized) (M62.81);History of falling (Z91.81)    Time: 5409-8119 PT Time Calculation (min) (ACUTE ONLY): 31 min   Charges:   PT Evaluation $PT Eval Moderate Complexity: 1 Mod PT Treatments $Gait Training: 8-22 mins PT General Charges $$ ACUTE PT VISIT: 1 Visit         [facility] PT Acute Rehabilitation Services Office [phone]   Angelina Ok [facility] 06/30/2023, 4:13 PM

## 2023-06-30 NOTE — Progress Notes (Signed)
 Started pt on Magnesium and Potassium electrolyte replenishment and approx 10 minutes after starting infusions pt became anxious. Pt stated she didn't know what was going on just that she didn't feel right. Pt requested to go to the bathroom; Pt holding her sats and VSS at this time; RN ambulated pt to the bathroom where she had a large diarrhea BM. After BM pt WOB increased and pt became diaphoretic. O2 increased to 6L while trying to get back to bed. Once in bed pt Sating 79%. At this time RN paged RT to room and nonrebreather placed over [place]. PT continuing to sat in the 80's. Pt placed on BiPAP and Elink notified. Lasix given ans STAT chest X ray ordered

## 2023-06-30 NOTE — Progress Notes (Signed)
 Patient had a 22 runs of V-tach at 1749 per telemetry, MD. [place] notify, pt is alert and oriented,  following commands, no complaint of SOB or chest pain. will continue to monitor per MD order.

## 2023-06-30 NOTE — Progress Notes (Signed)
 Date and time results received: 06/30/23 0446   Test: Troponin Critical Value: 461  Name of Provider Notified: Pola Corn MD  Cranford Mon

## 2023-06-30 NOTE — Progress Notes (Signed)
 NAME:  Ann Powell, MRN:  [id number], DOB:  [birth year], LOS: 3 ADMISSION DATE:  06/27/2023, CONSULTATION DATE:  3/28 REFERRING MD:  acute resp failure , CHIEF COMPLAINT:  scheving   History of Present Illness:  83 year old female w/ hx as outlined below. Reportedly in [facility] when Presented to ER acutely on 3/28. Per Hx was walking to vehicle from bank suddenly became diaphoretic and short of breath.  On further questioning her niece tells me that the patient typically only leaves the house the last Friday of the month.  Her only family member is the niece here locally who takes care of her and has so since December.  The patient has had worsening activity tolerance since January 2025.  The niece reports that the patient says she has a history of "inflammatory fever" and on the events that she does leave the house she typically starts feeling poorly after minimal exertion and blames it on this "inflammatory fever".  The niece did recall after further questioning when they left the house today the patient reported "the fevers in my chest".  She could not clarify whether or not this meant chest pain reflux heartburn shortness of breath.  In ER speaking one word phrase, sats 70s, not much better w/ NRB,, vomited, got more lethargic, intubated by EDP  PCXR diffuse pulmonary edema Sbp 130s to 150s VBG resp acidosis  General ST depression   PCCM asked to admit   Pertinent  Medical History  Essential HTN, PVD, HLD, gastric ulcer, PVD on asa and plavix remote fempop bypass left lower ext   Significant Hospital Events: Including procedures, antibiotic start and stop dates in addition to other pertinent events   3/28 admitted. Resp failure working dx NSTEMI pulm edema +/- PNA  3/29 no acute events overnight, remains on mechanical ventilation with low-dose Precedex  Interim History / Subjective:  BNP 876 >> 429 >> 1102 Developed Atrial tachycardia, ?Afib, on Amiodarone bolus and drip. In sinus  now Chest pain overnight: Trop is slightly elevated. CP is better with Mylanta. On 6 L [place] On IV Lasix 40 mg bid Objective   Blood pressure 124/73, pulse 91, temperature 100 F (37.8 C), resp. rate (!) 30, height 5\' 2"  (1.575 m), weight 54.4 kg, SpO2 (!) 89%.    Vent Mode: PSV;CPAP FiO2 (%):  [40 %] 40 % PEEP:  [5 cmH20] 5 cmH20 Pressure Support:  [5 cmH20] 5 cmH20   Intake/Output Summary (Last 24 hours) at 06/30/2023 1126 Last data filed at 06/30/2023 0900 Gross per 24 hour  Intake 539.47 ml  Output 2060 ml  Net -1520.53 ml   Filed Weights   06/27/23 1131 06/27/23 1434 06/30/23 0421  Weight: 53.7 kg 54 kg 54.4 kg    Examination: General: Thin kyphotic woman HEENT: elevated JVP Neuro: Awake, alert, oriented and OOB to chair. Follows commands and nonfocal CV: Distant, regular, no murmur PULM: Clear.  No crackles or wheezes GI: Nondistended with positive bowel sounds. No tenderness  Extremities: trace edema Skin: No rash  Resolved Hospital Problem list     Assessment & Plan:  Acute hypoxic respiratory failure secondary to diffuse bilateral pulmonary infiltrates.   -Favoring pulmonary edema but cannot rule out possible pneumonia at least as a contributing factor -RVP panel negative P: -Wean O2 as tolerated -Continue diuresis -Pulmonary hygiene, I/S -VAP prevention orders -Continue empiric ceftriaxone for 5 days -PT/OT eval  Mild lactic acidosis.    NSTEMI  -Twelve-lead EKG with nonspecific ST depression, initial  troponin 636 Extensive coronary artery atherosclerosis -Seen on CTA chest Acute systolic congestive heart failure -Echo with EF 35-40% with regional wall motion abnormality P: -Appreciate cardiology assistance -s/p Heparin infusion -ASA, Plavix, Statin -Diuresis as she can tolerate -Blood pressure improving slowly, initiate goal-directed therapy once fully stabilized  Afib/RVR, in sinus now -Switch to po Amiodarone -d/w cardiology    Hyperglycemia -Hemoglobin A1c 4.4% P: -Sliding scale insulin as ordered -CBG goal 140-180  Marked leukocytosis, improved -Question reactive versus an infectious P: -Continue empiric treatment for possible CAP (5 days) -Follow CBC intermittently  Thrombocytosis -Platelet on admission 1204, downtrending P: -Follow CBC -Depending on trend consider outpatient hematology evaluation  Jehovah's Witness P: -No blood products  Anemia of chronic illness  -Monitor CBC  GERD -Mylanta PRN -Famotidine -PPI IV x1   Best Practice (right click and "Reselect all SmartList Selections" daily)   Diet/type: NPO DVT prophylaxis:  Pressure ulcer(s): N/A GI prophylaxis: PPI Lines: N/A Foley:  N/A Code Status:  full code Last date of multidisciplinary goals of care discussion [pending]  Critical care time:   CRITICAL CARE Performed by: Patrici Ranks  Total critical care time: 37 minutes  06/30/2023, 11:26 AM [place] [facility]

## 2023-07-01 DIAGNOSIS — I214 Non-ST elevation (NSTEMI) myocardial infarction: Secondary | ICD-10-CM | POA: Diagnosis not present

## 2023-07-01 DIAGNOSIS — I5021 Acute systolic (congestive) heart failure: Secondary | ICD-10-CM | POA: Diagnosis not present

## 2023-07-01 DIAGNOSIS — J9601 Acute respiratory failure with hypoxia: Secondary | ICD-10-CM | POA: Diagnosis not present

## 2023-07-01 LAB — CBC
HCT: 28.7 % — ABNORMAL LOW (ref 36.0–46.0)
Hemoglobin: 8.7 g/dL — ABNORMAL LOW (ref 12.0–15.0)
MCH: 28.7 pg (ref 26.0–34.0)
MCHC: 30.3 g/dL (ref 30.0–36.0)
MCV: 94.7 fL (ref 80.0–100.0)
Platelets: 546 10*3/uL — ABNORMAL HIGH (ref 150–400)
RBC: 3.03 MIL/uL — ABNORMAL LOW (ref 3.87–5.11)
RDW: 19.9 % — ABNORMAL HIGH (ref 11.5–15.5)
WBC: 11.7 10*3/uL — ABNORMAL HIGH (ref 4.0–10.5)
nRBC: 4.9 % — ABNORMAL HIGH (ref 0.0–0.2)

## 2023-07-01 LAB — BASIC METABOLIC PANEL WITH GFR
Anion gap: 10 (ref 5–15)
BUN: 20 mg/dL (ref 8–23)
CO2: 29 mmol/L (ref 22–32)
Calcium: 8.8 mg/dL — ABNORMAL LOW (ref 8.9–10.3)
Chloride: 100 mmol/L (ref 98–111)
Creatinine, Ser: 0.97 mg/dL (ref 0.44–1.00)
GFR, Estimated: 59 mL/min — ABNORMAL LOW (ref >60)
Glucose, Bld: 118 mg/dL — ABNORMAL HIGH (ref 70–99)
Potassium: 3.8 mmol/L (ref 3.5–5.1)
Sodium: 139 mmol/L (ref 135–145)

## 2023-07-01 LAB — GLUCOSE, CAPILLARY
Glucose-Capillary: 101 mg/dL — ABNORMAL HIGH (ref 70–99)
Glucose-Capillary: 113 mg/dL — ABNORMAL HIGH (ref 70–99)
Glucose-Capillary: 113 mg/dL — ABNORMAL HIGH (ref 70–99)

## 2023-07-01 LAB — BRAIN NATRIURETIC PEPTIDE: B Natriuretic Peptide: 1780.1 pg/mL — ABNORMAL HIGH (ref 0.0–100.0)

## 2023-07-01 LAB — CULTURE, RESPIRATORY W GRAM STAIN: Culture: NO GROWTH

## 2023-07-01 LAB — MAGNESIUM: Magnesium: 2 mg/dL (ref 1.7–2.4)

## 2023-07-01 MED ORDER — METOPROLOL SUCCINATE ER 25 MG PO TB24
12.5000 mg | ORAL_TABLET | Freq: Every day | ORAL | Status: DC
  Administered 2023-07-01 – 2023-07-07 (×7): 12.5 mg via ORAL
  Filled 2023-07-01 (×7): qty 1

## 2023-07-01 MED ORDER — AMIODARONE HCL 200 MG PO TABS
200.0000 mg | ORAL_TABLET | Freq: Two times a day (BID) | ORAL | Status: AC
  Administered 2023-07-01 – 2023-07-06 (×10): 200 mg via ORAL
  Filled 2023-07-01 (×10): qty 1

## 2023-07-01 NOTE — Evaluation (Signed)
 Occupational Therapy Evaluation Patient Details Name: Ann Powell MRN: [id number] DOB: October 11, 1940 Today's Date: 07/01/2023   History of Present Illness   Pt is 83 year old presented to [facility] on  06/27/23 for respiratory failure, NSTEMI, and pulmonary edema. Intubated in ED. Extubated 06/29/23.  PMH - PAD, HTN     Clinical Impressions Pt ind at baseline with ADLs/functional mobility, lives with her niece who can assist at d/c. Pt currently needing CGA -min A for ADLs, CGA for bed mobility and CGA for transfers without AD. VSS on 4L O2 throughout. Pt presenting with impairments listed below, will follow acutely. Recommend HHOT at d/c.      If plan is discharge home, recommend the following:   A little help with walking and/or transfers;A little help with bathing/dressing/bathroom;Assistance with cooking/housework;Direct supervision/assist for medications management;Direct supervision/assist for financial management;Help with stairs or ramp for entrance;Assist for transportation     Functional Status Assessment   Patient has had a recent decline in their functional status and demonstrates the ability to make significant improvements in function in a reasonable and predictable amount of time.     Equipment Recommendations   Tub/shower seat     Recommendations for Other Services   PT consult     Precautions/Restrictions   Precautions Precautions: Fall Recall of Precautions/Restrictions: Intact Restrictions Weight Bearing Restrictions Per Provider Order: No     Mobility Bed Mobility Overal bed mobility: Needs Assistance Bed Mobility: Supine to Sit     Supine to sit: Contact guard          Transfers Overall transfer level: Needs assistance Equipment used: Rolling walker (2 wheels) Transfers: Sit to/from Stand, Bed to chair/wheelchair/BSC Sit to Stand: Contact guard assist     Step pivot transfers: Contact guard assist     General transfer comment:  cues to wait until lines are managed prior to mobilizing      Balance Overall balance assessment: Needs assistance, History of Falls Sitting-balance support: No upper extremity supported, Feet supported Sitting balance-Leahy Scale: Good     Standing balance support: No upper extremity supported, During functional activity Standing balance-Leahy Scale: Fair                             ADL either performed or assessed with clinical judgement   ADL Overall ADL's : Needs assistance/impaired Eating/Feeding: Set up;Sitting Eating/Feeding Details (indicate cue type and reason): eating lunch seated in chair Grooming: Set up;Sitting   Upper Body Bathing: Minimal assistance;Standing;Sitting   Lower Body Bathing: Minimal assistance;Sitting/lateral leans;Sit to/from stand   Upper Body Dressing : Contact guard assist;Sitting;Standing   Lower Body Dressing: Contact guard assist;Sitting/lateral leans Lower Body Dressing Details (indicate cue type and reason): donning socks via figure 4 Toilet Transfer: Contact guard Nurse, adult Details (indicate cue type and reason): simulated to chair Toileting- Clothing Manipulation and Hygiene: Minimal assistance       Functional mobility during ADLs: Contact guard assist       Vision   Vision Assessment?: No apparent visual deficits     Perception Perception: Not tested       Praxis Praxis: Not tested       Pertinent Vitals/Pain Pain Assessment Pain Assessment: No/denies pain     Extremity/Trunk Assessment Upper Extremity Assessment Upper Extremity Assessment: Generalized weakness   Lower Extremity Assessment Lower Extremity Assessment: Defer to PT evaluation   Cervical / Trunk Assessment Cervical / Trunk Assessment: Kyphotic  Communication Communication Communication: Impaired Factors Affecting Communication: Hearing impaired   Cognition Arousal: Alert Behavior During  Therapy: WFL for tasks assessed/performed Cognition: No apparent impairments                               Following commands: Intact       Cueing  General Comments   Cueing Techniques: Verbal cues  VSS on 4L O2   Exercises     Shoulder Instructions      Home Living Family/patient expects to be discharged to:: Private residence Living Arrangements: Other relatives (niece) Available Help at Discharge: Family;Available 24 hours/day Type of Home: House Home Access: Stairs to enter [employer] of Steps: 2 Entrance Stairs-Rails: None Home Layout: One level     Bathroom Shower/Tub: Tub/shower unit         Home Equipment: Agricultural consultant (2 wheels);Rollator (4 wheels);Cane - single point;BSC/3in1          Prior Functioning/Environment Prior Level of Function : Independent/Modified Independent;History of Falls (last six months)             Mobility Comments: cane out in the community ADLs Comments: ind    OT Problem List: Decreased strength;Decreased range of motion;Decreased activity tolerance;Impaired balance (sitting and/or standing);Decreased safety awareness   OT Treatment/Interventions: Self-care/ADL training;Therapeutic exercise;Energy conservation;DME and/or AE instruction;Therapeutic activities;Patient/family education;Balance training      OT Goals(Current goals can be found in the care plan section)   Acute Rehab OT Goals Patient Stated Goal: none stated OT Goal Formulation: With patient Time For Goal Achievement: 07/15/23 Potential to Achieve Goals: Good ADL Goals Pt Will Perform Upper Body Dressing: with modified independence;sitting Pt Will Perform Lower Body Dressing: with modified independence;sitting/lateral leans Pt Will Transfer to Toilet: with modified independence;ambulating;regular height toilet Pt Will Perform Tub/Shower Transfer: Tub transfer;Shower transfer;with modified independence;ambulating Additional  ADL Goal #1: pt will verbalize x3 energy conservation strategies in prep for ADLs   OT Frequency:  Min 1X/week    Co-evaluation              AM-PAC OT "6 Clicks" Daily Activity     Outcome Measure Help from another person eating meals?: A Little Help from another person taking care of personal grooming?: A Little Help from another person toileting, which includes using toliet, bedpan, or urinal?: A Little Help from another person bathing (including washing, rinsing, drying)?: A Lot Help from another person to put on and taking off regular upper body clothing?: A Little Help from another person to put on and taking off regular lower body clothing?: A Little 6 Click Score: 17   End of Session Nurse Communication: Mobility status  Activity Tolerance: Patient tolerated treatment well Patient left: with call bell/phone within reach;in chair;with chair alarm set  OT Visit Diagnosis: Unsteadiness on feet (R26.81);Other abnormalities of gait and mobility (R26.89);Muscle weakness (generalized) (M62.81)                Time: 4098-1191 OT Time Calculation (min): 16 min Charges:  OT General Charges $OT Visit: 1 Visit OT Evaluation $OT Eval Moderate Complexity: 1 Mod  Annibelle Brazie K, OTD, OTR/L SecureChat Preferred Acute Rehab [phone]   Jayanna Kroeger K Koonce 07/01/2023, 1:25 PM

## 2023-07-01 NOTE — Progress Notes (Signed)
 NAME:  Ann Powell, MRN:  [id number], DOB:  [birth year], LOS: 4 ADMISSION DATE:  06/27/2023, CONSULTATION DATE:  3/28 REFERRING MD:  acute resp failure , CHIEF COMPLAINT:  scheving   History of Present Illness:  83 year old female w/ hx as outlined below. Reportedly in [facility] when Presented to ER acutely on 3/28. Per Hx was walking to vehicle from bank suddenly became diaphoretic and short of breath.  On further questioning her niece tells me that the patient typically only leaves the house the last Friday of the month.  Her only family member is the niece here locally who takes care of her and has so since December.  The patient has had worsening activity tolerance since January 2025.  The niece reports that the patient says she has a history of "inflammatory fever" and on the events that she does leave the house she typically starts feeling poorly after minimal exertion and blames it on this "inflammatory fever".  The niece did recall after further questioning when they left the house today the patient reported "the fevers in my chest".  She could not clarify whether or not this meant chest pain reflux heartburn shortness of breath.  In ER speaking one word phrase, sats 70s, not much better w/ NRB,, vomited, got more lethargic, intubated by EDP  PCXR diffuse pulmonary edema Sbp 130s to 150s VBG resp acidosis  General ST depression   PCCM asked to admit   Pertinent  Medical History  Essential HTN, PVD, HLD, gastric ulcer, PVD on asa and plavix remote fempop bypass left lower ext   Significant Hospital Events: Including procedures, antibiotic start and stop dates in addition to other pertinent events   3/28 admitted. Resp failure working dx NSTEMI pulm edema +/- PNA  3/29 no acute events overnight, remains on mechanical ventilation with low-dose Precedex  Interim History / Subjective:  BNP 876 >> 429 >> 1102 >>1780 Developed Atrial tachycardia 06/29/2023 night, ?Afib, on Amiodarone bolus  and drip, now on PO since June 30, 2023. In sinus now No more chest pain : Trop was slightly elevated. CP improved with Mylanta. Had 20 sec run of VT, given K and Mg IV On 4 L [place] On IV Lasix 40 mg bid Negative fluid balance 2.5 L over 48 hrs  Objective   Blood pressure 116/60, pulse 97, temperature 98.4 F (36.9 C), temperature source Oral, resp. rate (!) 31, height 5\' 2"  (1.575 m), weight 54.4 kg, SpO2 100%.    Vent Mode: BIPAP;PSV FiO2 (%):  [30 %-50 %] 30 % Set Rate:  [14 bmp] 14 bmp PEEP:  [6 cmH20-8 cmH20] 6 cmH20   Intake/Output Summary (Last 24 hours) at 07/01/2023 0913 Last data filed at 07/01/2023 0800 Gross per 24 hour  Intake 461.12 ml  Output 1190 ml  Net -728.88 ml   Filed Weights   06/27/23 1131 06/27/23 1434 06/30/23 0421  Weight: 53.7 kg 54 kg 54.4 kg    Examination: General: Thin kyphotic woman HEENT: elevated JVP Neuro: Awake, alert, oriented and OOB to chair. Follows commands and nonfocal CV: Distant, regular, no murmur PULM: Clear.  No crackles or wheezes GI: Nondistended with positive bowel sounds. No tenderness  Extremities: trace edema. Right 4th toe dorsal superficial ulcer (seen by podiatry before admission) Skin: No rash  Resolved Hospital Problem list     Assessment & Plan:  Acute hypoxic respiratory failure secondary to diffuse bilateral pulmonary infiltrates.   -Favoring pulmonary edema but cannot rule out possible pneumonia  at least as a contributing factor -RVP panel negative P: -Wean O2 as tolerated -Continue diuresis -Pulmonary hygiene, I/S -VAP prevention orders -Continue empiric ceftriaxone for 5 days -PT/OT eval -Uses the I/S   Mild lactic acidosis.    NSTEMI  -Twelve-lead EKG with nonspecific ST depression, initial troponin 636 Extensive coronary artery atherosclerosis -Seen on CTA chest Acute systolic congestive heart failure, s/p cardiogenic shock -Echo with EF 35-40% with regional wall motion  abnormality P: -Appreciate cardiology assistance -s/p Heparin infusion -ASA, Plavix, Statin -Diuresis as she can tolerate -Blood pressure improving slowly, initiate goal-directed therapy once fully stabilized  Afib/RVR, in sinus now -po Amiodarone -d/w cardiology   Hyperglycemia -Hemoglobin A1c 4.4% P: -Sliding scale insulin as ordered -CBG goal 140-180  Marked leukocytosis, improved -Question reactive versus an infectious P: -Continue empiric treatment for possible CAP (5 days) -Follow CBC intermittently  Thrombocytosis -Platelet on admission 1204, downtrending P: -Follow CBC -Depending on trend consider outpatient hematology evaluation  Jehovah's Witness P: -No blood products  Anemia of chronic illness  -Monitor CBC  GERD -Mylanta PRN -Famotidine  Right 4th toe superficial ulcer -Consult wound care  Best Practice (right click and "Reselect all SmartList Selections" daily)   Diet/type: cardiac  DVT prophylaxis: SQ heparin  Pressure ulcer(s): N/A GI prophylaxis: PPI Lines: N/A Foley:  N/A Code Status:  full code Last date of multidisciplinary goals of care discussion [pending]  D/w daughter at the bedside  Critical care time:   CRITICAL CARE Performed by: Patrici Ranks  Total critical care time: 36 minutes  07/01/2023, 9:13 AM [facility]

## 2023-07-01 NOTE — Consult Note (Signed)
 WOC Nurse Consult Note: patient follows with podiatry for nail care; per their note 06/23/2023 patient had maceration to R 4th toe from patient using band aids on the area; he was concerned at that time regarding her vascular status; patient has had vascular intervention in the past and deferred at that time to be reconsulted to them  Reason for Consult: R 4th toe wound  Wound type: partial thickness r/t moisture and friction  Pressure Injury POA: NA  Measurement: see nursing flowsheet  Wound bed: described as maceration (request made for photo documentation)  Drainage (amount, consistency, odor) per nursing flowsheet  Periwound:maceration  Dressing procedure/placement/frequency: Clean 4th toe R foot with NS, apply a piece of silver hydrofiber (Aquacel [employer] Hayden Pedro #[id number]) to dry and separate area.  May secure with silicone foam or dry gauze and tape whichever is preferred.   Patient should continue follow-up with podiatry as outpatient.  POC discussed with bedside nurse. WOC team will not follow. Re-consult if further needs arise.   Thank you,    Priscella Mann MSN, RN-BC, [employer]

## 2023-07-01 NOTE — TOC Progression Note (Signed)
 Transition of Care [facility]) - Progression Note    Patient Details  Name: Ann Powell MRN: [id number] Date of Birth: 03-01-1941  Transition of Care [facility]) CM/SW Contact  Tom-Johnson, Hershal Coria, RN Phone Number: 07/01/2023, 8:42 AM  Clinical Narrative:     Home health recommended, patient and Niece, Duwayne Heck has no preference. CM sent referral to [facility] and Kandee Keen noted acceptance, info on AVS.   CM will continue to follow as patient progresses with care towards discharge.         Expected Discharge Plan and Services                                               Social Determinants of Health (SDOH) Interventions SDOH Screenings   Food Insecurity: No Food Insecurity (06/29/2023)  Housing: Low Risk  (06/29/2023)  Transportation Needs: No Transportation Needs (06/29/2023)  Utilities: Not At Risk (06/29/2023)  Alcohol Screen: Low Risk  (01/16/2023)  Depression (PHQ2-9): Low Risk  (03/04/2023)  Financial Resource Strain: Low Risk  (01/16/2023)  Physical Activity: Insufficiently Active (01/16/2023)  Social Connections: Moderately Isolated (06/29/2023)  Stress: No Stress Concern Present (01/16/2023)  Tobacco Use: Low Risk  (06/27/2023)  Health Literacy: Adequate Health Literacy (01/16/2023)    Readmission Risk Interventions    06/30/2023    2:48 PM  Readmission Risk Prevention Plan  Post Dischage Appt Complete  Medication Screening Complete  Transportation Screening Complete

## 2023-07-01 NOTE — Progress Notes (Signed)
 Patient Name: Ann Powell Date of Encounter: 07/01/2023 [facility] Cardiologist: None   Interval Summary  .    83 year old female with history of PAD followed at [facility], also hypertension, hyperlipidemia on whom cardiology was consulted for troponin elevation.  Initial presentation for respiratory distress found to be hypoxic with SpO2 down into the 70s, ultimately requiring intubation and mechanical ventilation.  Lactic acidosis.  She has extensive coronary atherosclerosis on CT and echocardiogram demonstrates reduced ejection fraction approximately 40% with regional wall motion abnormalities.  Initial consult felt this was most likely demand ischemia in the setting of acute illness, with a markedly elevated leukocytosis and CT concerning for multifocal pneumonia.  Critical care evaluation suggest there is likely a component of cardiogenic edema.  Heparin is plan to continue for a total of 48 hours and then will stop.  She is also on aspirin which we recommended decreasing to 81 mg daily and Plavix 75 mg daily with a history of PAD.  She was hypotensive and has been supported with norepinephrine, no room for GDMT at this time.  Norepinephrine has been paused at this time, BNP improving, vent weaning attempted.  Receiving diuretic today 40 mg IV twice daily.  Troponin increased from [phone].  She is now extubated, and communicates easily. No complaints. Breathing comfortably on [place]. However, had atrial arrhythmia 2 nights ago and last even had nonsustained VT.  Hb was downtrending, need to identify source of anemia. Currently low but stable.   Vital Signs .    Vitals:   07/01/23 0700 07/01/23 0734 07/01/23 0800 07/01/23 1100  BP: 106/66  116/60   Pulse: (!) 104  97   Resp: (!) 26  (!) 31   Temp:  98.4 F (36.9 C)  97.8 F (36.6 C)  TempSrc:  Oral  Oral  SpO2: 97%  100%   Weight:      Height:        Intake/Output Summary (Last 24 hours) at 07/01/2023 1508 Last data filed  at 07/01/2023 1200 Gross per 24 hour  Intake 324.07 ml  Output 1815 ml  Net -1490.93 ml      06/30/2023    4:21 AM 06/27/2023    2:34 PM 06/27/2023   11:31 AM  Last 3 Weights  Weight (lbs) 119 lb 14.9 oz 119 lb 0.8 oz 118 lb 6.2 oz  Weight (kg) 54.4 kg 54 kg 53.7 kg      Telemetry/ECG    SR - Personally Reviewed  Physical Exam .   GEN: NAD Neck: no significant JVD. Severe cervical kyphosis. Cardiac: RRR, no murmurs, rubs, or gallops.  Respiratory: Clear to auscultation bilaterally. GI: Soft, nontender, non-distended  MS: No edema  Assessment & Plan .     #NSTEMI #HFrEF, acute #CAC, suspect ischemia -diuresing 40 mg IV BID lasix - heparin IV 48 hr therapy completed 06/29/23.  - eventual ischemic eval when she recovers and if anemia improves.  -start metoprolol succinate 12.5 mg daily and monitor for improvement in nsvt and for GDMT  #Anemic -downtrended, then recovered, per primary service. May limit possibility of interventions if anemia persists. Jehovah's witness, no blood products can be administered.  #possible atrial fibrillation -troponin elevated with chest pain during episode. Suspect ongoing ischemia with heavy CAC on CT. However, with anemia and jehovah's witness who will not receive blood products, need to manage conservatively for now. Ischemic workup only if no bleeding source and Hb normalizes. - ok to continue IV amiodarone for now,  can transition to oral whenever patient is reliably taking PO. Amio 200 mg po Bid.    For questions or updates, please contact [facility] Please consult [email or web address] for contact info under        Signed, Parke Poisson, MD

## 2023-07-02 DIAGNOSIS — I214 Non-ST elevation (NSTEMI) myocardial infarction: Secondary | ICD-10-CM | POA: Diagnosis not present

## 2023-07-02 DIAGNOSIS — I5021 Acute systolic (congestive) heart failure: Secondary | ICD-10-CM | POA: Diagnosis not present

## 2023-07-02 DIAGNOSIS — J9601 Acute respiratory failure with hypoxia: Secondary | ICD-10-CM | POA: Diagnosis not present

## 2023-07-02 LAB — CULTURE, BLOOD (ROUTINE X 2)
Culture: NO GROWTH
Culture: NO GROWTH
Culture: NO GROWTH
Culture: NO GROWTH

## 2023-07-02 LAB — GLUCOSE, CAPILLARY
Glucose-Capillary: 131 mg/dL — ABNORMAL HIGH (ref 70–99)
Glucose-Capillary: 93 mg/dL (ref 70–99)
Glucose-Capillary: 111 mg/dL — ABNORMAL HIGH (ref 70–99)
Glucose-Capillary: 99 mg/dL (ref 70–99)
Glucose-Capillary: 117 mg/dL — ABNORMAL HIGH (ref 70–99)

## 2023-07-02 LAB — BASIC METABOLIC PANEL WITH GFR
Anion gap: 7 (ref 5–15)
BUN: 17 mg/dL (ref 8–23)
CO2: 31 mmol/L (ref 22–32)
Calcium: 8.5 mg/dL — ABNORMAL LOW (ref 8.9–10.3)
Chloride: 102 mmol/L (ref 98–111)
Creatinine, Ser: 0.92 mg/dL (ref 0.44–1.00)
GFR, Estimated: >60 mL/min (ref >60)
Glucose, Bld: 114 mg/dL — ABNORMAL HIGH (ref 70–99)
Potassium: 3.6 mmol/L (ref 3.5–5.1)
Sodium: 140 mmol/L (ref 135–145)

## 2023-07-02 LAB — CBC
HCT: 28.9 % — ABNORMAL LOW (ref 36.0–46.0)
Hemoglobin: 8.7 g/dL — ABNORMAL LOW (ref 12.0–15.0)
MCH: 28.6 pg (ref 26.0–34.0)
MCHC: 30.1 g/dL (ref 30.0–36.0)
MCV: 95.1 fL (ref 80.0–100.0)
Platelets: 522 10*3/uL — ABNORMAL HIGH (ref 150–400)
RBC: 3.04 MIL/uL — ABNORMAL LOW (ref 3.87–5.11)
RDW: 19.4 % — ABNORMAL HIGH (ref 11.5–15.5)
WBC: 9.4 10*3/uL (ref 4.0–10.5)
nRBC: 4.3 % — ABNORMAL HIGH (ref 0.0–0.2)

## 2023-07-02 MED ORDER — POTASSIUM CHLORIDE CRYS ER 20 MEQ PO TBCR
40.0000 meq | EXTENDED_RELEASE_TABLET | Freq: Once | ORAL | Status: AC
  Administered 2023-07-02: 40 meq via ORAL
  Filled 2023-07-02: qty 2

## 2023-07-02 MED ORDER — FUROSEMIDE 40 MG PO TABS
40.0000 mg | ORAL_TABLET | Freq: Two times a day (BID) | ORAL | Status: DC
  Administered 2023-07-02 – 2023-07-04 (×5): 40 mg via ORAL
  Filled 2023-07-02 (×5): qty 1

## 2023-07-02 MED ORDER — ACETAMINOPHEN 325 MG PO TABS
650.0000 mg | ORAL_TABLET | Freq: Four times a day (QID) | ORAL | Status: DC
  Administered 2023-07-02 – 2023-07-07 (×20): 650 mg via ORAL
  Filled 2023-07-02 (×20): qty 2

## 2023-07-02 MED ORDER — POTASSIUM CHLORIDE CRYS ER 20 MEQ PO TBCR
20.0000 meq | EXTENDED_RELEASE_TABLET | Freq: Every day | ORAL | Status: DC
  Administered 2023-07-02 – 2023-07-07 (×6): 20 meq via ORAL
  Filled 2023-07-02 (×2): qty 1
  Filled 2023-07-02: qty 2
  Filled 2023-07-02 (×3): qty 1

## 2023-07-02 MED ORDER — MAGNESIUM OXIDE -MG SUPPLEMENT 400 (240 MG) MG PO TABS
200.0000 mg | ORAL_TABLET | Freq: Every day | ORAL | Status: DC
  Administered 2023-07-02 – 2023-07-07 (×6): 200 mg via ORAL
  Filled 2023-07-02: qty 1
  Filled 2023-07-02: qty 0.5
  Filled 2023-07-02 (×3): qty 1
  Filled 2023-07-02: qty 0.5

## 2023-07-02 NOTE — Progress Notes (Signed)
 Patient Name: Ann Powell Date of Encounter: 07/02/2023 [facility] Cardiologist: None   Interval Summary  .    83 year old female with history of PAD followed at [facility], also hypertension, hyperlipidemia on whom cardiology was consulted for troponin elevation.  Initial presentation for respiratory distress found to be hypoxic with SpO2 down into the 70s, ultimately requiring intubation and mechanical ventilation.  Lactic acidosis.  She has extensive coronary atherosclerosis on CT and echocardiogram demonstrates reduced ejection fraction approximately 40% with regional wall motion abnormalities.  Initial consult felt this was most likely demand ischemia in the setting of acute illness, with a markedly elevated leukocytosis and CT concerning for multifocal pneumonia.  Critical care evaluation suggest there is likely a component of cardiogenic edema.  Heparin is plan to continue for a total of 48 hours and then will stop.  She is also on aspirin which we recommended decreasing to 81 mg daily and Plavix 75 mg daily with a history of PAD.  She was hypotensive and has been supported with norepinephrine, no room for GDMT at this time.  Norepinephrine has been paused at this time, BNP improving, vent weaning attempted.  Receiving diuretic today 40 mg IV twice daily.  Troponin increased from [phone].  Stable symptoms, improving overall. Hb low but stable. No overt bleeding. Tele quiet in last 24 hr with addition of metoprolol.   Vital Signs .    Vitals:   07/02/23 0600 07/02/23 0700 07/02/23 0720 07/02/23 0800  BP: 107/63 111/61  (!) 104/57  Pulse: 85 86  93  Resp: (!) 23 (!) 24  (!) 28  Temp:   98.4 F (36.9 C)   TempSrc:   Oral   SpO2: 99% 99%  98%  Weight:      Height:        Intake/Output Summary (Last 24 hours) at 07/02/2023 0909 Last data filed at 07/02/2023 0800 Gross per 24 hour  Intake 300 ml  Output 2910 ml  Net -2610 ml      07/02/2023    5:00 AM 06/30/2023     4:21 AM 06/27/2023    2:34 PM  Last 3 Weights  Weight (lbs) 128 lb 1.4 oz 119 lb 14.9 oz 119 lb 0.8 oz  Weight (kg) 58.1 kg 54.4 kg 54 kg      Telemetry/ECG    SR - Personally Reviewed  Physical Exam .   GEN: NAD Neck: no significant JVD. Severe cervical kyphosis. Cardiac: RRR, no murmurs, rubs, or gallops.  Respiratory: Clear to auscultation bilaterally. GI: Soft, nontender, non-distended  MS: No edema  Assessment & Plan .     #NSTEMI #HFrEF, acute #CAC, suspect ischemia -diuresing 40 mg IV BID lasix, cr stable, output continues. Net -5 L for admission. Continue lasix at current dose today.  - heparin IV 48 hr therapy completed 06/29/23.  - continues on dapt for PAD indication, asa 81 mg daily and plavix 75 mg daily  - eventual ischemic eval when she recovers and if anemia improves. For now, medical management recommended.  -metoprolol succinate 12.5 mg daily, tele quiet.  - consider adding spironolactone today 12.5 mg daily - bp limits addition of further GDMT  #Anemic -downtrended, then recovered, per primary service. May limit possibility of interventions if anemia persists. Jehovah's witness, no blood products can be administered.  #possible atrial fibrillation -troponin elevated with chest pain during episode. Suspect ongoing ischemia with heavy CAC on CT. However, with anemia and jehovah's witness who will not receive  blood products, need to manage conservatively for now. Ischemic workup only if no bleeding source and Hb normalizes. - ok to continue IV amiodarone for now, can transition to oral whenever patient is reliably taking PO. Amio 200 mg po Bid to finish load, then after 5 days tx to amio 200 mg daily.     For questions or updates, please contact [facility] Please consult [email or web address] for contact info under        Signed, Parke Poisson, MD

## 2023-07-02 NOTE — Progress Notes (Signed)
 NAME:  Ann Powell, MRN:  [id number], DOB:  1940/07/21, LOS: 5 ADMISSION DATE:  06/27/2023, CONSULTATION DATE:  3/28 REFERRING MD:  acute resp failure , CHIEF COMPLAINT:  scheving   History of Present Illness:  83 year old female w/ hx as outlined below. Reportedly in [facility] when Presented to ER acutely on 3/28. Per Hx was walking to vehicle from bank suddenly became diaphoretic and short of breath.  On further questioning her niece tells me that the patient typically only leaves the house the last Friday of the month.  Her only family member is the niece here locally who takes care of her and has so since December.  The patient has had worsening activity tolerance since January 2025.  The niece reports that the patient says she has a history of "inflammatory fever" and on the events that she does leave the house she typically starts feeling poorly after minimal exertion and blames it on this "inflammatory fever".  The niece did recall after further questioning when they left the house today the patient reported "the fevers in my chest".  She could not clarify whether or not this meant chest pain reflux heartburn shortness of breath.  In ER speaking one word phrase, sats 70s, not much better w/ NRB,, vomited, got more lethargic, intubated by EDP  PCXR diffuse pulmonary edema Sbp 130s to 150s VBG resp acidosis  General ST depression   PCCM asked to admit   Pertinent  Medical History  Essential HTN, PVD, HLD, gastric ulcer, PVD on asa and plavix remote fempop bypass left lower ext   Significant Hospital Events: Including procedures, antibiotic start and stop dates in addition to other pertinent events   3/28 admitted. Resp failure working dx NSTEMI pulm edema +/- PNA  3/29 no acute events overnight, remains on mechanical ventilation with low-dose Precedex  Interim History / Subjective:  BNP 876 >> 429 >> 1102 >>1780 Developed Atrial tachycardia 06/29/2023 night, ?Afib, on Amiodarone bolus  and drip, now on PO since June 30, 2023. In sinus now No more chest pain : Trop was slightly elevated. CP improved with Mylanta. Had 20 sec run of VT, given K and Mg IV  07/02/2023: On 2 L [place] On IV Lasix 40 mg bid Negative fluid balance 5.1 L over 72 hrs  Objective   Blood pressure (!) 104/57, pulse 93, temperature 98.4 F (36.9 C), temperature source Oral, resp. rate (!) 28, height 5\' 2"  (1.575 m), weight 58.1 kg, SpO2 98%.        Intake/Output Summary (Last 24 hours) at 07/02/2023 1018 Last data filed at 07/02/2023 0800 Gross per 24 hour  Intake 300 ml  Output 2210 ml  Net -1910 ml   Filed Weights   06/27/23 1434 06/30/23 0421 07/02/23 0500  Weight: 54 kg 54.4 kg 58.1 kg    Examination: General: Thin kyphotic woman HEENT: elevated JVP Neuro: Awake, alert, oriented and OOB to chair. Follows commands and nonfocal CV: Distant, regular, no murmur PULM: Clear.  No crackles or wheezes GI: Nondistended with positive bowel sounds. No tenderness  Extremities: trace edema. Right 4th toe dorsal superficial ulcer (seen by podiatry before admission and wound care yesterday) Skin: No rash  Resolved Hospital Problem list     Assessment & Plan:  Acute hypoxic respiratory failure secondary to diffuse bilateral pulmonary infiltrates.   -Favoring pulmonary edema but cannot rule out possible pneumonia at least as a contributing factor -RVP panel negative P: -Wean O2 as tolerated -Continue diuresis--->PO -  Oral K and Mg  -Pulmonary hygiene, I/S -s/p empiric ceftriaxone for 5 days -PT/OT eval -Uses the I/S  -am labs  Mild lactic acidosis.    NSTEMI  -Twelve-lead EKG with nonspecific ST depression, initial troponin 636 Extensive coronary artery atherosclerosis -Seen on CTA chest Acute systolic congestive heart failure, s/p cardiogenic shock -Echo with EF 35-40% with regional wall motion abnormality P: -Appreciate cardiology assistance -s/p Heparin infusion -ASA, Plavix,  Statin -Diuresis as she can tolerate -Blood pressure improving slowly, initiate goal-directed therapy once fully stabilized  Afib/RVR, in sinus now -po Amiodarone and BB -d/w cardiology   Hyperglycemia: resolved -Hemoglobin A1c 4.4% P: -d/c sliding scale insulin   Marked leukocytosis, resolved  -Question reactive versus an infectious P: -s/p empiric treatment for possible CAP (5 days) -Follow CBC intermittently  Thrombocytosis -Platelet on admission 1204, downtrending P: -Follow CBC -Depending on trend consider outpatient hematology evaluation  Jehovah's Witness P: -No blood products  Anemia of chronic illness  -Monitor CBC  GERD -Mylanta PRN -Famotidine  Right 4th toe superficial ulcer -under wound care  Best Practice (right click and "Reselect all SmartList Selections" daily)   Diet/type: cardiac  DVT prophylaxis: SQ heparin  Pressure ulcer(s): N/A GI prophylaxis: PPI Lines: N/A Foley:  N/A Code Status:  full code Last date of multidisciplinary goals of care discussion [transferred to tele, d/w cardiology]  D/w daughter at the bedside  Critical care time:   CRITICAL CARE Performed by: Patrici Ranks  Total critical care time: 37 minutes  07/02/2023, 10:18 AM [facility]

## 2023-07-02 NOTE — Plan of Care (Signed)
  Problem: Coping: Goal: Ability to adjust to condition or change in health will improve Outcome: Progressing   Problem: Fluid Volume: Goal: Ability to maintain a balanced intake and output will improve Outcome: Progressing   Problem: Metabolic: Goal: Ability to maintain appropriate glucose levels will improve Outcome: Progressing   Problem: Nutritional: Goal: Maintenance of adequate nutrition will improve Outcome: Progressing   Problem: Tissue Perfusion: Goal: Adequacy of tissue perfusion will improve Outcome: Progressing   Problem: Education: Goal: Knowledge of General Education information will improve Description: Including pain rating scale, medication(s)/side effects and non-pharmacologic comfort measures Outcome: Progressing   Problem: Clinical Measurements: Goal: Will remain free from infection Outcome: Progressing Goal: Diagnostic test results will improve Outcome: Progressing Goal: Respiratory complications will improve Outcome: Progressing Goal: Cardiovascular complication will be avoided Outcome: Progressing

## 2023-07-02 NOTE — Progress Notes (Signed)
   07/02/23 2140  BiPAP/CPAP/SIPAP  $ Non-Invasive Ventilator  Non-Invasive Vent Subsequent  Reason BIPAP/CPAP not in use Other(comment) (patient and patient family refused Cpap for the night. Advised to have RN contact RT if they change their minds)

## 2023-07-02 NOTE — Progress Notes (Signed)
   07/02/23 1130  Spiritual Encounters  Type of Visit Initial  Care provided to: Pt and family  Conversation partners present during encounter Nurse  Referral source IDT Rounds  Reason for visit Routine spiritual support  OnCall Visit No   Visited with patient and family during rounds. Provided encouragement and informed patient and family of the availability of spiritual care.

## 2023-07-02 NOTE — Progress Notes (Signed)
 eLink Physician-Brief Progress Note Patient Name: Ann Powell May Aderman DOB: [birth year] MRN: [id number]   Date of Service  07/02/2023  HPI/Events of Note  Having significant pain in her feet, Tylenol helps and she takes it scheduled at home.  Requesting that that be matched here  eICU Interventions  Change acetaminophen to scheduled     Intervention Category Minor Interventions: Routine modifications to care plan (e.g. PRN medications for pain, fever)  Tierra Divelbiss 07/02/2023, 4:11 AM

## 2023-07-03 DIAGNOSIS — J9601 Acute respiratory failure with hypoxia: Secondary | ICD-10-CM | POA: Diagnosis not present

## 2023-07-03 DIAGNOSIS — I5021 Acute systolic (congestive) heart failure: Secondary | ICD-10-CM | POA: Diagnosis not present

## 2023-07-03 DIAGNOSIS — I214 Non-ST elevation (NSTEMI) myocardial infarction: Secondary | ICD-10-CM | POA: Diagnosis not present

## 2023-07-03 LAB — BASIC METABOLIC PANEL WITH GFR
Anion gap: 12 (ref 5–15)
BUN: 25 mg/dL — ABNORMAL HIGH (ref 8–23)
CO2: 28 mmol/L (ref 22–32)
Calcium: 8.6 mg/dL — ABNORMAL LOW (ref 8.9–10.3)
Chloride: 99 mmol/L (ref 98–111)
Creatinine, Ser: 0.93 mg/dL (ref 0.44–1.00)
GFR, Estimated: >60 mL/min (ref >60)
Glucose, Bld: 101 mg/dL — ABNORMAL HIGH (ref 70–99)
Potassium: 4.2 mmol/L (ref 3.5–5.1)
Sodium: 139 mmol/L (ref 135–145)

## 2023-07-03 LAB — GLUCOSE, CAPILLARY
Glucose-Capillary: 115 mg/dL — ABNORMAL HIGH (ref 70–99)
Glucose-Capillary: 84 mg/dL (ref 70–99)
Glucose-Capillary: 82 mg/dL (ref 70–99)
Glucose-Capillary: 118 mg/dL — ABNORMAL HIGH (ref 70–99)

## 2023-07-03 LAB — MAGNESIUM: Magnesium: 2 mg/dL (ref 1.7–2.4)

## 2023-07-03 MED ORDER — AMIODARONE HCL 200 MG PO TABS
200.0000 mg | ORAL_TABLET | Freq: Every day | ORAL | Status: DC
Start: 2023-07-07 — End: 2023-07-07
  Administered 2023-07-07: 200 mg via ORAL
  Filled 2023-07-03: qty 1

## 2023-07-03 NOTE — TOC Progression Note (Signed)
 Transition of Care [facility]) - Progression Note    Patient Details  Name: Ann Powell MRN: [id number] Date of Birth: 12/22/1940  [facility] CM/SW Contact  Dellie Burns [place], [place] Phone Number: 07/03/2023, 3:09 PM  Clinical Narrative:   Spoke to pt's niece Danielle re dc dispo options. Niece requesting SNF for STR or ALF. Explained pt does not have a payor for ALF and will need therapy recommendation for SNF in order for [facility] to consider approval for SNF. PT current recommendation is for [facility] but they are re-evaluating. SW will assist as indicated.   Dellie Burns, MSW, LCSW [phone] (coverage)            Expected Discharge Plan and Services                                               Social Determinants of Health (SDOH) Interventions SDOH Screenings   Food Insecurity: No Food Insecurity (06/29/2023)  Housing: Low Risk  (06/29/2023)  Transportation Needs: No Transportation Needs (06/29/2023)  Utilities: Not At Risk (06/29/2023)  Alcohol Screen: Low Risk  (01/16/2023)  Depression (PHQ2-9): Low Risk  (03/04/2023)  Financial Resource Strain: Low Risk  (01/16/2023)  Physical Activity: Insufficiently Active (01/16/2023)  Social Connections: Moderately Isolated (06/29/2023)  Stress: No Stress Concern Present (01/16/2023)  Tobacco Use: Low Risk  (06/27/2023)  Health Literacy: Adequate Health Literacy (01/16/2023)    Readmission Risk Interventions    06/30/2023    2:48 PM  Readmission Risk Prevention Plan  Post Dischage Appt Complete  Medication Screening Complete  Transportation Screening Complete

## 2023-07-03 NOTE — Plan of Care (Signed)

## 2023-07-03 NOTE — Progress Notes (Signed)
 Patient Name: Ann Powell Date of Encounter: 07/03/2023 [facility] Cardiologist: None   Interval Summary  .    83 year old female with history of PAD followed at [facility], also hypertension, hyperlipidemia on whom cardiology was consulted for troponin elevation.  Initial presentation for respiratory distress found to be hypoxic with SpO2 down into the 70s, ultimately requiring intubation and mechanical ventilation.  Lactic acidosis.  She has extensive coronary atherosclerosis on CT and echocardiogram demonstrates reduced ejection fraction approximately 40% with regional wall motion abnormalities.  Initial consult felt this was most likely demand ischemia in the setting of acute illness, with a markedly elevated leukocytosis and CT concerning for multifocal pneumonia.  Critical care evaluation suggest there is likely a component of cardiogenic edema.  Heparin is plan to continue for a total of 48 hours and then will stop.  She is also on aspirin which we recommended decreasing to 81 mg daily and Plavix 75 mg daily with a history of PAD.  She was hypotensive and has been supported with norepinephrine, no room for GDMT at this time.  Norepinephrine has been paused at this time, BNP improving, vent weaning attempted.  Receiving diuretic today 40 mg IV twice daily.  Troponin increased from [phone].  Stable symptoms, improving overall. Hb low but stable. No overt bleeding. Tele quiet in last 24 hr with addition of metoprolol. Feels well and no longer on O2.  Vital Signs .    Vitals:   07/03/23 1400 07/03/23 1533 07/03/23 1603 07/03/23 2000  BP: 123/64  114/64 (!) 111/58  Pulse: 86  86 83  Resp: 18  20 (!) 27  Temp:  97.7 F (36.5 C) 97.8 F (36.6 C) 98.2 F (36.8 C)  TempSrc:  Oral Oral Oral  SpO2: 100%  95% 95%  Weight:   49.3 kg   Height:        Intake/Output Summary (Last 24 hours) at 07/03/2023 2132 Last data filed at 07/03/2023 1700 Gross per 24 hour  Intake 120 ml   Output 1045 ml  Net -925 ml      07/03/2023    4:03 PM 07/03/2023    5:00 AM 07/02/2023    5:00 AM  Last 3 Weights  Weight (lbs) 108 lb 11 oz 108 lb 3.9 oz 128 lb 1.4 oz  Weight (kg) 49.3 kg 49.1 kg 58.1 kg      Telemetry/ECG    SR - Personally Reviewed  Physical Exam .   GEN: NAD Neck: JVD to lower 1/3 of neck. Severe cervical kyphosis. Cardiac: RRR, no murmurs, rubs, or gallops.  Respiratory: Clear to auscultation bilaterally. GI: Soft, nontender, non-distended  MS: No edema  Assessment & Plan .     #NSTEMI #HFrEF, acute #CAC, suspect ischemia - now on lasix 40 mg po BID, with elevated JVP sitting upright, continue today, good output.  - heparin IV 48 hr therapy completed 06/29/23.  - continues on dapt for PAD indication, asa 81 mg daily and plavix 75 mg daily  - Medical management recommended. Pt and I had a long conversation about cath vs medical therapy, she would like to try to get stronger and reconsider cath as an outpatient, this is appropriate.  -metoprolol succinate 12.5 mg daily, tele quiet. BP stable.  - consider adding spironolactone today 12.5 mg daily - bp limits addition of further GDMT  #Anemic -downtrended, then recovered, per primary service. May limit possibility of interventions if anemia persists. Jehovah's witness, no blood products can be administered.  #  possible atrial fibrillation -troponin elevated with chest pain during episode. Suspect ongoing ischemia with heavy CAC on CT. However, with anemia and jehovah's witness who will not receive blood products, need to manage conservatively for now. Ischemic workup only if no bleeding source and Hb normalizes. - Amio 200 mg po Bid to finish load, then after 5 days tx to amio 200 mg daily. OAC deferred given brief nature of AF and low Hb currently DAPT. No recurrence of possible atrial arrhythmia in >48 hours.     For questions or updates, please contact [facility] Please consult [email or web address]  for contact info under        Signed, Parke Poisson, MD

## 2023-07-03 NOTE — Progress Notes (Addendum)
 Physical Therapy Treatment Patient Details Name: Ann Powell MRN: [id number] DOB: 07-21-1940 Today's Date: 07/03/2023   History of Present Illness Pt is 83 year old presented to [facility] on  06/27/23 for respiratory failure, NSTEMI, and pulmonary edema. Intubated in ED. Extubated 06/29/23. PMH - PAD, HTN    PT Comments  Pt received in supine, agreeable to therapy session and with good effort for transfer, gait and stair training with no AD. Per pt and niece report, she is alone most of the day and does not use AD at baseline in the home, only when ambulating in community. PTA had pt simulate typical household tasks (ambulating to bathroom, stair negotiation and reaching for objects without UE support) and pt with significant imbalance when not using AD, needing minA to recover for household ambulation tasks and modA for stability and balance correction when ascending stairs with only HHA (pt does not have rails for her steps at home). RR elevated with activity on RA but noisy pleth signal with poor waveform on RA with activity, HR 80's-90's bpm and SpO2 reading WFL at rest and possible brief desat to mid-80's with standing activity, but improves when pt sits in chair without needing [place] applied, RN notified. Patient will benefit from continued inpatient follow up therapy, <3 hours/day as she is a high fall risk at this time and does not use AD at baseline, currently needing min to modA without AD to perform typical household and self-care tasks and does not have 24/7 physical assist available from family at this time. PTA discussed recommendation update with pt/niece and social worker per pt request and also with supervising PT Alexa S and in agreement.     If plan is discharge home, recommend the following: Assistance with cooking/housework;Help with stairs or ramp for entrance;Assist for transportation;A little help with bathing/dressing/bathroom;A little help with walking and/or transfers   Can travel  by private vehicle     Yes  Equipment Recommendations  None recommended by PT (pt has cane and RW but does not typically use these in her home)    Recommendations for Other Services       Precautions / Restrictions Precautions Precautions: Fall Recall of Precautions/Restrictions: Intact Precaution/Restrictions Comments: monitor O2 (difficult signal) Restrictions Weight Bearing Restrictions Per Provider Order: No     Mobility  Bed Mobility Overal bed mobility: Needs Assistance Bed Mobility: Supine to Sit     Supine to sit: Supervision     General bed mobility comments: increased effort, use of bed features; PTA assist with line mgmt    Transfers Overall transfer level: Needs assistance Equipment used: None Transfers: Sit to/from Stand Sit to Stand: Contact guard assist           General transfer comment: CGA without RW; assist with line mgmt and safety cues needed; pt needs BUE support for controlled descent and to push into standing; standing from chair and toilet heights, needs wall rail for toilet transfers with CGA    Ambulation/Gait Ambulation/Gait assistance: Min assist Gait Distance (Feet): 75 Feet Assistive device: None Gait Pattern/deviations: Step-through pattern, Decreased stride length, Trunk flexed, Drifts right/left, Wide base of support, Leaning posteriorly Gait velocity: decr Gait velocity interpretation: <1.31 ft/sec, indicative of household ambulator   General Gait Details: Pt and niece report she typically "furniture surfs" or walks short distances in the home without AD. Pt also loads wood into her wood furnace "constantly" during the day. When unsupported, pt with multiple losses of balance for short distance gait  tasks in room and needing constant minA to prevent lateral or posterior loss of balance wtihout AD. Pt not able to simulate ability to pick up and place wood into furnace this date unassisted. SpO2 noisy signal, intermittent desat to  lower 80's on RA, with standing break improves to ~84-86% on RA and with seated break improves to >92% on RA within 1-2 minutes, but may need supplemental O2 with exertion, difficult to assess today due to poor pleth signal on her L ear and her R hand.   Stairs Stairs: Yes Stairs assistance: Min assist, Mod assist Stair Management: One rail Left, Backwards, Forwards, No rails Number of Stairs: 1 (x8 reps) General stair comments: pt ascending single 7" step with L rail and minA HHA on RUE, but per niece she does not have a rail, when provided with only single UE support (HHA), pt needing modA to stabilize due to posterior instability when ascending/descending. Per pt/family member, she is typically alone and when going outside, only has cane for support. Not safe to attempt by herself at this time.   Wheelchair Mobility     Tilt Bed    Modified Rankin (Stroke Patients Only)       Balance Overall balance assessment: Needs assistance Sitting-balance support: No upper extremity supported, Feet supported Sitting balance-Leahy Scale: Good     Standing balance support: No upper extremity supported, During functional activity, Reliant on assistive device for balance Standing balance-Leahy Scale: Poor Standing balance comment: multiple losses of balance with dynamic standing tasks unsupported today; pt does not use AD at home                            Communication Communication Communication: Impaired Factors Affecting Communication: Hearing impaired  Cognition Arousal: Alert Behavior During Therapy: WFL for tasks assessed/performed   PT - Cognitive impairments: Safety/Judgement                       PT - Cognition Comments: decreased awareness of deficits/safety; very motivated to progress Following commands: Intact      Cueing Cueing Techniques: Verbal cues  Exercises Other Exercises Other Exercises: IS x 10 reps cues for improved technique, pt achieves  ~500 mL; HR 80's bpm in chair, RR 30+ with activity but noisy SpO2 signal    General Comments General comments (skin integrity, edema, etc.): Noisy signal on ear and finger, SpO2 mid-90's and up on RA at rest/in chair but with standing tasks, SpO2 (noisy signal) reading low to mid-80's with exertional tasks, but quickly improves to >90% on RA in <30 seconds when she returns to seated posture. RN notified, pt without dypsnea on exertion or other s/sx distress. HR 90's bpm with activity      Pertinent Vitals/Pain Pain Assessment Pain Assessment: No/denies pain    Home Living                          Prior Function            PT Goals (current goals can now be found in the care plan section) Acute Rehab PT Goals Patient Stated Goal: to get stronger before I go home PT Goal Formulation: With patient/family Time For Goal Achievement: 07/14/23 Progress towards PT goals: Progressing toward goals    Frequency    Min 2X/week      PT Plan      Co-evaluation  AM-PAC PT "6 Clicks" Mobility   Outcome Measure  Help needed turning from your back to your side while in a flat bed without using bedrails?: None Help needed moving from lying on your back to sitting on the side of a flat bed without using bedrails?: A Little (no rails) Help needed moving to and from a bed to a chair (including a wheelchair)?: A Little Help needed standing up from a chair using your arms (e.g., wheelchair or bedside chair)?: A Little Help needed to walk in hospital room?: A Little Help needed climbing 3-5 steps with a railing? : A Lot 6 Click Score: 18    End of Session Equipment Utilized During Treatment: Gait belt Activity Tolerance: Patient tolerated treatment well Patient left: in chair;with call bell/phone within reach;with chair alarm set;with family/visitor present (niece in room) Nurse Communication: Mobility status;Other (comment) (unreliable pleth signal with  activity; sensor on ear and finger PRN) PT Visit Diagnosis: Other abnormalities of gait and mobility (R26.89);Muscle weakness (generalized) (M62.81);History of falling (Z91.81)     Time: 1517-6160 PT Time Calculation (min) (ACUTE ONLY): 39 min  Charges:    $Gait Training: 23-37 mins $Therapeutic Activity: 8-22 mins PT General Charges $$ ACUTE PT VISIT: 1 Visit                     Aysia Lowder P., PTA Acute Rehabilitation Services Secure Chat Preferred 9a-5:30pm Office: [phone]    Dorathy Kinsman [facility] 07/03/2023, 4:05 PM

## 2023-07-03 NOTE — Progress Notes (Signed)
 Occupational Therapy Treatment Patient Details Name: Ann Powell MRN: [id number] DOB: 12/31/1940 Today's Date: 07/03/2023   History of present illness Pt is 83 year old presented to [facility] on  06/27/23 for respiratory failure, NSTEMI, and pulmonary edema. Intubated in ED. Extubated 06/29/23.  PMH - PAD, HTN   OT comments  Pt making excellent progress towards OT goals. Pt able to manage standing ADLs at sink and hallway mobility using RW w/ no more than Supervision. Pt denied SOB or fatigue, noted SpO2 100% on RA post activity. Provided energy conservation handout and initial education on ADL modification at home w/ pt verbalizing understanding.       If plan is discharge home, recommend the following:  Assistance with cooking/housework;Direct supervision/assist for medications management;Direct supervision/assist for financial management;Help with stairs or ramp for entrance;Assist for transportation   Equipment Recommendations  None recommended by OT    Recommendations for Other Services      Precautions / Restrictions Precautions Precautions: Fall Recall of Precautions/Restrictions: Intact Precaution/Restrictions Comments: monitor O2 Restrictions Weight Bearing Restrictions Per Provider Order: No       Mobility Bed Mobility               General bed mobility comments: in recliner on entry    Transfers Overall transfer level: Modified independent Equipment used: Rolling walker (2 wheels) Transfers: Sit to/from Stand Sit to Stand: Modified independent (Device/Increase time)                 Balance Overall balance assessment: Needs assistance Sitting-balance support: No upper extremity supported, Feet supported Sitting balance-Leahy Scale: Good     Standing balance support: No upper extremity supported, During functional activity, Reliant on assistive device for balance Standing balance-Leahy Scale: Fair                             ADL  either performed or assessed with clinical judgement   ADL Overall ADL's : Needs assistance/impaired     Grooming: Supervision/safety;Standing;Oral care Grooming Details (indicate cue type and reason): no LOB, reaching appropriately to access items         Upper Body Dressing : Set up;Sitting                   Functional mobility during ADLs: Supervision/safety;Rolling walker (2 wheels) General ADL Comments: able to mobilize in room to sink for ADLs, around unit with RW and without SOB. provided energy conservation handout and initial education re: using a chair for sponge bathing tasks at sink, potential modifications for gathering firewood from porch if needed    Extremity/Trunk Assessment Upper Extremity Assessment Upper Extremity Assessment: Overall WFL for tasks assessed;Right hand dominant   Lower Extremity Assessment Lower Extremity Assessment: Defer to PT evaluation        Vision   Vision Assessment?: No apparent visual deficits   Perception     Praxis     Communication Communication Communication: Impaired Factors Affecting Communication: Hearing impaired   Cognition Arousal: Alert Behavior During Therapy: WFL for tasks assessed/performed Cognition: No apparent impairments                               Following commands: Intact        Cueing   Cueing Techniques: Verbal cues  Exercises      Shoulder Instructions       General Comments SpO2 100%  on RA    Pertinent Vitals/ Pain       Pain Assessment Pain Assessment: No/denies pain  Home Living                                          Prior Functioning/Environment              Frequency  Min 2X/week        Progress Toward Goals  OT Goals(current goals can now be found in the care plan section)  Progress towards OT goals: Progressing toward goals  Acute Rehab OT Goals Patient Stated Goal: home soon OT Goal Formulation: With patient Time  For Goal Achievement: 07/15/23 Potential to Achieve Goals: Good ADL Goals Pt Will Perform Upper Body Dressing: with modified independence;sitting Pt Will Perform Lower Body Dressing: with modified independence;sitting/lateral leans Pt Will Transfer to Toilet: with modified independence;ambulating;regular height toilet Pt Will Perform Tub/Shower Transfer: Tub transfer;Shower transfer;with modified independence;ambulating Additional ADL Goal #1: pt will verbalize x3 energy conservation strategies in prep for ADLs  Plan      Co-evaluation                 AM-PAC OT "6 Clicks" Daily Activity     Outcome Measure   Help from another person eating meals?: None Help from another person taking care of personal grooming?: A Little Help from another person toileting, which includes using toliet, bedpan, or urinal?: A Little Help from another person bathing (including washing, rinsing, drying)?: A Little Help from another person to put on and taking off regular upper body clothing?: A Little Help from another person to put on and taking off regular lower body clothing?: A Little 6 Click Score: 19    End of Session Equipment Utilized During Treatment: Gait belt;Rolling walker (2 wheels)  OT Visit Diagnosis: Unsteadiness on feet (R26.81);Other abnormalities of gait and mobility (R26.89);Muscle weakness (generalized) (M62.81)   Activity Tolerance Patient tolerated treatment well   Patient Left in chair;with call bell/phone within reach;with chair alarm set   Nurse Communication Mobility status        Time: 4034-7425 OT Time Calculation (min): 21 min  Charges: OT General Charges $OT Visit: 1 Visit OT Treatments $Self Care/Home Management : 8-22 mins  Bradd Canary, OTR/L Acute Rehab Services Office: [phone]   Lorre Munroe 07/03/2023, 10:26 AM

## 2023-07-03 NOTE — Plan of Care (Signed)
   Problem: Metabolic: Goal: Ability to maintain appropriate glucose levels will improve Outcome: Progressing

## 2023-07-03 NOTE — Progress Notes (Signed)
 PROGRESS NOTE    Ann Powell  ZOX:[id number] DOB: [birth year] DOA: 06/27/2023 PCP: Donita Brooks, MD    Brief Narrative:  83 year old with history of peripheral artery disease, hypertension, hyperlipidemia presented to the emergency room on 3/28 with sudden onset of diaphoresis and shortness of breath while walking.  Lives at home with her niece.  Family has reported gradual activity intolerance since last 2 months.  In the emergency room she was in respiratory distress.  Oxygen saturation 70%.  Did not respond to nonrebreather vomited and she got more lethargic so she had to be intubated.  On presentation, chest x-ray with diffuse pulmonary edema.  VBG with respiratory acidosis.  EKG with diffuse ST depressions. 3/28, admitted with respiratory distress intubated and treated for pulmonary edema and pneumonia. 3/30, extubated to nasal cannula oxygen. Patient developed atrial tachycardia on 3/30 suspected A-fib and treated with amiodarone bolus and drip.  Slightly elevated troponins. 4/3, out of ICU to medical floor on minimum oxygen.    Subjective: Patient seen and examined.  Niece at the bedside.  Patient herself was on the 2 L oxygen.  She denied any chest pain shortness of breath at rest.  At rest she feels like she is back to her usual self. Patient tells me she is all good and no problems.  Niece is worried about getting shortness of breath and low oxygen on mobility.  We discussed about mobilizing around today and checking for ambulatory oxygen.   Assessment & Plan:   Acute hypoxemic respiratory failure secondary to pulmonary edema Acute systolic congestive heart failure status post cardiogenic shock Non-STEMI  Patient was treated with mechanical ventilation and currently improved.  She has been weaned off to oxygen and possibly can wean off to room air.  She may need home oxygen with ambulation that will be evaluated today.  Continue chest physiotherapy and incentive  spirometry. Ejection fraction with 35 to 40% with regional wall motion abnormality.  Initially treated with IV Lasix and now on oral Lasix.  On Toprol-XL.  Seen by cardiology.  Initially on heparin infusion and completed 48 hours of heparin infusion. Started on dual antiplatelet therapy with aspirin and Plavix along with a statin. Cardiology continues to follow.  Currently deferring ischemic evaluation.  May need outpatient follow-up.  Suspected pneumonia: This was likely pulmonary edema.  Cultures negative.  RVP panel negative.  Completed 5 days of Rocephin therapy.  Continue chest pressure therapy.  A-fib with RVR, atrial tachycardia: Developed during acute illnesses.  Patient was treated with amiodarone infusion and now on oral amiodarone taper along with beta-blockers.  She can likely gradually come off amiodarone.  Cardiology following.  Not on anticoagulation because of chronic anemia, already being on DAPT.  Anemia of chronic disease: Hemoglobin is stable.  Mobilize.  Monitor for ambulatory oxygen need.  Discontinue Foley catheter.  Can transfer out of ICU.  Anticipate home tomorrow with home health PT OT.    DVT prophylaxis: heparin injection 5,000 Units Start: 06/30/23 0000   Code Status: Full code Family Communication: Niece at the bedside Disposition Plan: Status is: Inpatient Remains inpatient appropriate because: Still on oxygen, needs monitoring     Consultants:  Cardiology Critical care  Procedures:  Intubation  Antimicrobials:  Completed     Objective: Vitals:   07/03/23 0700 07/03/23 0745 07/03/23 0800 07/03/23 0900  BP: 122/61  (!) 109/56 115/62  Pulse: 90  90 91  Resp: 16  (!) 24 (!) 31  Temp:  98 F (  36.7 C)    TempSrc:  Oral    SpO2: 100%  100% 100%  Weight:      Height:        Intake/Output Summary (Last 24 hours) at 07/03/2023 1058 Last data filed at 07/03/2023 0400 Gross per 24 hour  Intake 200 ml  Output 2660 ml  Net -2460 ml   Filed  Weights   06/30/23 0421 07/02/23 0500 07/03/23 0500  Weight: 54.4 kg 58.1 kg 49.1 kg    Examination:  General exam: Appears calm and comfortable.  Pleasant and interactive. Thin elderly female. Respiratory system: Clear to auscultation. Respiratory effort normal.  Mostly on room air today. Cardiovascular system: S1 & S2 heard, RRR. No JVD, murmurs, rubs, gallops or clicks. No pedal edema. Gastrointestinal system: Abdomen is nondistended, soft and nontender. No organomegaly or masses felt. Normal bowel sounds heard. Central nervous system: Alert and oriented. No focal neurological deficits. Extremities: Symmetric 5 x 5 power. Skin: No rashes, lesions or ulcers Psychiatry: Judgement and insight appear normal. Mood & affect appropriate.     Data Reviewed: I have personally reviewed following labs and imaging studies  CBC: Recent Labs  Lab 06/27/23 1142 06/27/23 1147 06/27/23 1528 06/27/23 2111 06/29/23 0506 06/30/23 1111 07/01/23 0312 07/02/23 0513  WBC 35.2*  --  27.5*  --  9.0 13.2* 11.7* 9.4  NEUTROABS 19.7*  --   --   --   --   --   --   --   HGB 11.8*   < > 11.7* 9.2* 7.6* 9.9* 8.7* 8.7*  HCT 41.8   < > 40.2 27.0* 25.6* 33.4* 28.7* 28.9*  MCV 100.5*  --  98.5  --  96.2 95.4 94.7 95.1  PLT 1,204*  --  723*  --  476* 703* 546* 522*   < > = values in this interval not displayed.   Basic Metabolic Panel: Recent Labs  Lab 06/28/23 1635 06/29/23 0506 06/30/23 0218 06/30/23 1843 07/01/23 2142 07/02/23 0513 07/03/23 0516  NA  --  142 140 141 139 140 139  K  --  3.8 3.7 3.1* 3.8 3.6 4.2  CL  --  107 104 102 100 102 99  CO2  --  19* 23 28 29 31 28   GLUCOSE  --  128* 120* 119* 118* 114* 101*  BUN  --  24* 22 18 20 17  25*  CREATININE  --  0.90 0.89 0.86 0.97 0.92 0.93  CALCIUM  --  8.7* 8.8* 9.2 8.8* 8.5* 8.6*  MG 2.0 2.0  --  2.0 2.0  --  2.0  PHOS 3.4 2.8  --   --   --   --   --    GFR: Estimated Creatinine Clearance: 36.8 mL/min (by C-G formula based on SCr of  0.93 mg/dL). Liver Function Tests: Recent Labs  Lab 06/27/23 1142  AST 42*  ALT 17  ALKPHOS 66  BILITOT 1.1  PROT 7.1  ALBUMIN 3.8   No results for input(s): "LIPASE", "AMYLASE" in the last 168 hours. No results for input(s): "AMMONIA" in the last 168 hours. Coagulation Profile: Recent Labs  Lab 06/27/23 1432  INR 1.1   Cardiac Enzymes: No results for input(s): "CKTOTAL", "CKMB", "CKMBINDEX", "TROPONINI" in the last 168 hours. BNP (last 3 results) No results for input(s): "PROBNP" in the last 8760 hours. HbA1C: No results for input(s): "HGBA1C" in the last 72 hours. CBG: Recent Labs  Lab 07/02/23 [phone] 07/02/23 1115 07/02/23 1517 07/02/23 2120 07/03/23 9604  GLUCAP 93 111* 99 117* 115*   Lipid Profile: No results for input(s): "CHOL", "HDL", "LDLCALC", "TRIG", "CHOLHDL", "LDLDIRECT" in the last 72 hours. Thyroid Function Tests: No results for input(s): "TSH", "T4TOTAL", "FREET4", "T3FREE", "THYROIDAB" in the last 72 hours. Anemia Panel: No results for input(s): "VITAMINB12", "FOLATE", "FERRITIN", "TIBC", "IRON", "RETICCTPCT" in the last 72 hours. Sepsis Labs: Recent Labs  Lab 06/27/23 1341 06/28/23 1320  PROCALCITON  --  0.70  LATICACIDVEN 2.7* 2.5*    Recent Results (from the past 240 hours)  MRSA Next Gen by PCR, Nasal     Status: None   Collection Time: 06/27/23 11:19 AM  Result Value Ref Range Status   MRSA by PCR Next Gen NOT DETECTED NOT DETECTED Final    Comment: (NOTE) The GeneXpert MRSA Assay (FDA approved for NASAL specimens only), is one component of a comprehensive MRSA colonization surveillance program. It is not intended to diagnose MRSA infection nor to guide or monitor treatment for MRSA infections. Test performance is not FDA approved in patients less than 75 years old. Performed at [facility], [address], [place], [place] [address]   Culture, blood (routine x 2)     Status: None   Collection Time: 06/27/23 12:08 PM    Specimen: BLOOD LEFT HAND  Result Value Ref Range Status   Specimen Description BLOOD LEFT HAND  Final   Special Requests   Final    AEROBIC BOTTLE ONLY Blood Culture results may not be optimal due to an inadequate volume of blood received in culture bottles   Culture   Final    NO GROWTH 5 DAYS Performed at [facility], [address], [place], [place] [address]    Report Status 07/02/2023 FINAL  Final  Culture, blood (routine x 2)     Status: None   Collection Time: 06/27/23 12:13 PM   Specimen: BLOOD RIGHT WRIST  Result Value Ref Range Status   Specimen Description BLOOD RIGHT WRIST  Final   Special Requests   Final    BOTTLES DRAWN AEROBIC AND ANAEROBIC Blood Culture results may not be optimal due to an inadequate volume of blood received in culture bottles   Culture   Final    NO GROWTH 5 DAYS Performed at [facility], [address] [place], [place] [address]    Report Status 07/02/2023 FINAL  Final  Culture, blood (Routine X 2) w Reflex to ID Panel     Status: None   Collection Time: 06/27/23  3:28 PM   Specimen: BLOOD RIGHT ARM  Result Value Ref Range Status   Specimen Description BLOOD RIGHT ARM  Final   Special Requests   Final    AEROBIC BOTTLE ONLY Blood Culture results may not be optimal due to an inadequate volume of blood received in culture bottles   Culture   Final    NO GROWTH 5 DAYS Performed at [facility], [address], [place], [place] [address]    Report Status 07/02/2023 FINAL  Final  Respiratory (~20 pathogens) panel by PCR     Status: None   Collection Time: 06/27/23  3:51 PM   Specimen: Nasopharyngeal Swab; Respiratory  Result Value Ref Range Status   Adenovirus NOT DETECTED NOT DETECTED Final   Coronavirus 229E NOT DETECTED NOT DETECTED Final    Comment: (NOTE) The Coronavirus on the Respiratory Panel, DOES NOT test for the novel  Coronavirus (2019 nCoV)    Coronavirus HKU1 NOT  DETECTED NOT DETECTED Final   Coronavirus  NL63 NOT DETECTED NOT DETECTED Final   Coronavirus OC43 NOT DETECTED NOT DETECTED Final   Metapneumovirus NOT DETECTED NOT DETECTED Final   Rhinovirus / Enterovirus NOT DETECTED NOT DETECTED Final   Influenza A NOT DETECTED NOT DETECTED Final   Influenza B NOT DETECTED NOT DETECTED Final   Parainfluenza Virus 1 NOT DETECTED NOT DETECTED Final   Parainfluenza Virus 2 NOT DETECTED NOT DETECTED Final   Parainfluenza Virus 3 NOT DETECTED NOT DETECTED Final   Parainfluenza Virus 4 NOT DETECTED NOT DETECTED Final   Respiratory Syncytial Virus NOT DETECTED NOT DETECTED Final   Bordetella pertussis NOT DETECTED NOT DETECTED Final   Bordetella Parapertussis NOT DETECTED NOT DETECTED Final   Chlamydophila pneumoniae NOT DETECTED NOT DETECTED Final   Mycoplasma pneumoniae NOT DETECTED NOT DETECTED Final    Comment: Performed at [facility], [address], [place], [place] [address]  SARS Coronavirus 2 by RT PCR (hospital order, performed in [facility] hospital lab) *cepheid single result test* Anterior Nasal Swab     Status: None   Collection Time: 06/27/23  3:51 PM   Specimen: Anterior Nasal Swab  Result Value Ref Range Status   SARS Coronavirus 2 by RT PCR NEGATIVE NEGATIVE Final    Comment: Performed at [facility], [address], [place], [place] [address]  Culture, blood (Routine X 2) w Reflex to ID Panel     Status: None   Collection Time: 06/27/23  6:55 PM   Specimen: BLOOD  Result Value Ref Range Status   Specimen Description BLOOD SITE NOT SPECIFIED  Final   Special Requests   Final    BOTTLES DRAWN AEROBIC AND ANAEROBIC Blood Culture results may not be optimal due to an inadequate volume of blood received in culture bottles   Culture   Final    NO GROWTH 5 DAYS Performed at [facility], [address], [place], [place] [address]    Report Status 07/02/2023 FINAL  Final  Culture, Respiratory w Gram Stain (tracheal aspirate)     Status: None   Collection Time:  06/28/23  8:45 PM   Specimen: Tracheal Aspirate; Respiratory  Result Value Ref Range Status   Specimen Description TRACHEAL ASPIRATE  Final   Special Requests NONE  Final   Gram Stain   Final    FEW WBC PRESENT,BOTH PMN AND MONONUCLEAR RARE GRAM POSITIVE COCCI IN PAIRS    Culture   Final    MODERATE Normal respiratory flora-no Staph aureus or Pseudomonas seen Performed at [facility], [address], [place], [place] [address]    Report Status 07/01/2023 FINAL  Final         Radiology Studies: No results found.      Scheduled Meds:  acetaminophen  650 mg Oral Q6H   amiodarone  200 mg Oral BID   aspirin  81 mg Oral Daily   Chlorhexidine Gluconate Cloth  6 each Topical Daily   clopidogrel  75 mg Oral Daily   docusate sodium  100 mg Oral BID   famotidine  20 mg Oral Daily   feeding supplement  237 mL Oral BID BM   furosemide  40 mg Oral BID   heparin injection (subcutaneous)  5,000 Units Subcutaneous Q8H   ipratropium-albuterol  3 mL Nebulization Once   magnesium oxide  200 mg Oral Daily   metoprolol succinate  12.5 mg Oral Daily   multivitamin with  minerals  1 tablet Oral Daily   polyethylene glycol  17 g Oral Daily   potassium chloride  20 mEq Oral Daily   rosuvastatin  20 mg Oral Daily   Continuous Infusions:   LOS: 6 days    Time spent: 52 minutes    Dorcas Carrow, MD [facility]

## 2023-07-04 ENCOUNTER — Other Ambulatory Visit: Payer: Self-pay | Admitting: Student

## 2023-07-04 DIAGNOSIS — I214 Non-ST elevation (NSTEMI) myocardial infarction: Secondary | ICD-10-CM

## 2023-07-04 DIAGNOSIS — I5021 Acute systolic (congestive) heart failure: Secondary | ICD-10-CM

## 2023-07-04 DIAGNOSIS — J9601 Acute respiratory failure with hypoxia: Secondary | ICD-10-CM | POA: Diagnosis not present

## 2023-07-04 LAB — CBC
HCT: 31.5 % — ABNORMAL LOW (ref 36.0–46.0)
Hemoglobin: 9.4 g/dL — ABNORMAL LOW (ref 12.0–15.0)
MCH: 28.7 pg (ref 26.0–34.0)
MCHC: 29.8 g/dL — ABNORMAL LOW (ref 30.0–36.0)
MCV: 96 fL (ref 80.0–100.0)
Platelets: 636 10*3/uL — ABNORMAL HIGH (ref 150–400)
RBC: 3.28 MIL/uL — ABNORMAL LOW (ref 3.87–5.11)
RDW: 20.5 % — ABNORMAL HIGH (ref 11.5–15.5)
WBC: 9.2 10*3/uL (ref 4.0–10.5)
nRBC: 5.6 % — ABNORMAL HIGH (ref 0.0–0.2)

## 2023-07-04 LAB — GLUCOSE, CAPILLARY
Glucose-Capillary: 97 mg/dL (ref 70–99)
Glucose-Capillary: 84 mg/dL (ref 70–99)
Glucose-Capillary: 116 mg/dL — ABNORMAL HIGH (ref 70–99)
Glucose-Capillary: 107 mg/dL — ABNORMAL HIGH (ref 70–99)

## 2023-07-04 LAB — BASIC METABOLIC PANEL WITH GFR
Anion gap: 11 (ref 5–15)
BUN: 20 mg/dL (ref 8–23)
CO2: 29 mmol/L (ref 22–32)
Calcium: 9 mg/dL (ref 8.9–10.3)
Chloride: 99 mmol/L (ref 98–111)
Creatinine, Ser: 1.06 mg/dL — ABNORMAL HIGH (ref 0.44–1.00)
GFR, Estimated: 53 mL/min — ABNORMAL LOW (ref >60)
Glucose, Bld: 89 mg/dL (ref 70–99)
Potassium: 4 mmol/L (ref 3.5–5.1)
Sodium: 139 mmol/L (ref 135–145)

## 2023-07-04 LAB — MAGNESIUM: Magnesium: 2.1 mg/dL (ref 1.7–2.4)

## 2023-07-04 MED ORDER — FUROSEMIDE 40 MG PO TABS
40.0000 mg | ORAL_TABLET | Freq: Every day | ORAL | Status: DC
  Administered 2023-07-05 – 2023-07-07 (×3): 40 mg via ORAL
  Filled 2023-07-04 (×3): qty 1

## 2023-07-04 MED ORDER — EMPAGLIFLOZIN 10 MG PO TABS
10.0000 mg | ORAL_TABLET | Freq: Every day | ORAL | Status: DC
  Administered 2023-07-04 – 2023-07-06 (×3): 10 mg via ORAL
  Filled 2023-07-04 (×3): qty 1

## 2023-07-04 NOTE — Plan of Care (Signed)

## 2023-07-04 NOTE — TOC Initial Note (Addendum)
 Transition of Care [facility]) - Initial/Assessment Note    Patient Details  Name: Ann Powell MRN: [id number] Date of Birth: 09-Oct-1940  Transition [facility]) CM/SW Contact:    Marliss Coots, LCSW Phone Number: 07/04/2023, 12:58 PM  Clinical Narrative:                  12:58 PM CSW introduced self and role to patient at bedside. CSW inquired about SNF. Patient confirmed interest in discharge to SNF. Patient consented CSW to submit referrals to SNFs in [facility] and expressed preference in SNFs in [place]. CSW made patient and medical team aware of possible insurance denial due to ambulation. Patient and medical team expressed understanding of the information.  Expected Discharge Plan: Skilled Nursing Facility Barriers to Discharge: Continued Medical Work up, SNF Pending bed offer   Patient Goals and CMS Choice Patient states their goals for this hospitalization and ongoing recovery are:: SNF          Expected Discharge Plan and Services In-house Referral: Clinical Social Work   Post Acute Care Choice: Skilled Nursing Facility Living arrangements for the past 2 months: Single Family Home                                      Prior Living Arrangements/Services Living arrangements for the past 2 months: Single Family Home Lives with:: Relatives Patient language and need for interpreter reviewed:: Yes Do you feel safe going back to the place where you live?: Yes            Criminal Activity/Legal Involvement Pertinent to Current Situation/Hospitalization: No - Comment as needed  Activities of Daily Living   ADL Screening (condition at time of admission) Independently performs ADLs?: Yes (appropriate for developmental age) Is the patient deaf or have difficulty hearing?: No Does the patient have difficulty seeing, even when wearing glasses/contacts?: No Does the patient have difficulty concentrating, remembering, or making decisions?:  No  Permission Sought/Granted Permission sought to share information with : Family Supports Permission granted to share information with : No (Contact information on chart)  Share Information with NAME: Tanairi Cypert  Permission granted to share info w AGENCY: SNF  Permission granted to share info w Relationship: Niece  Permission granted to share info w Contact Information: [phone]  Emotional Assessment Appearance:: Appears stated age Attitude/Demeanor/Rapport: Engaged Affect (typically observed): Accepting, Appropriate, Adaptable, Calm, Stable, Pleasant Orientation: : Oriented to Self, Oriented to Place, Oriented to Situation, Oriented to  Time Alcohol / Substance Use: Not Applicable Psych Involvement: No (comment)  Admission diagnosis:  Acute respiratory failure (HCC) [J96.00] Acute respiratory failure with hypoxia and hypercapnia (HCC) [J96.01, J96.02] Patient Active Problem List   Diagnosis Date Noted   Acute respiratory failure (HCC) 06/27/2023   NSTEMI (non-ST elevated myocardial infarction) (HCC) 06/27/2023   Acute systolic heart failure (HCC) 06/27/2023   PVD (peripheral vascular disease) (HCC)    Hypertension 01/29/2017   Edema of right lower extremity 09/25/2016   Gangrenous (HCC) 09/25/2016   Pain in extremity 09/25/2016   PAD (peripheral artery disease) (HCC) 09/25/2016   Critical lower limb ischemia (HCC) 08/22/2016   PCP:  Donita Brooks, MD Pharmacy:   [facility] [phone] [place], [place] - [address] [place] [address] Phone: [phone] Fax: [phone]  [facility] - [place], [place] - [address] [place] [address] Phone: [phone] Fax: [phone]     Social Drivers of Health (SDOH) Social History: SDOH Screenings   Food Insecurity: No Food Insecurity (06/29/2023)  Housing: Low Risk  (06/29/2023)  Transportation Needs: No  Transportation Needs (06/29/2023)  Utilities: Not At Risk (06/29/2023)  Alcohol Screen: Low Risk  (01/16/2023)  Depression (PHQ2-9): Low Risk  (03/04/2023)  Financial Resource Strain: Low Risk  (01/16/2023)  Physical Activity: Insufficiently Active (01/16/2023)  Social Connections: Moderately Isolated (06/29/2023)  Stress: No Stress Concern Present (01/16/2023)  Tobacco Use: Low Risk  (06/27/2023)  Health Literacy: Adequate Health Literacy (01/16/2023)   SDOH Interventions: Transportation Interventions: Inpatient TOC, Intervention Not Indicated, Patient Resources (Friends/Family)   Readmission Risk Interventions    06/30/2023    2:48 PM  Readmission Risk Prevention Plan  Post Dischage Appt Complete  Medication Screening Complete  Transportation Screening Complete

## 2023-07-04 NOTE — Progress Notes (Signed)
 Patient Name: Ann Powell Date of Encounter: 07/04/2023 [facility] Cardiologist: None   Interval Summary  .    83 year old female with history of PAD followed at [facility], also hypertension, hyperlipidemia on whom cardiology was consulted for troponin elevation.  Initial presentation for respiratory distress found to be hypoxic with SpO2 down into the 70s, ultimately requiring intubation and mechanical ventilation.  Lactic acidosis.  She has extensive coronary atherosclerosis on CT and echocardiogram demonstrates reduced ejection fraction approximately 40% with regional wall motion abnormalities.  Initial consult felt this was most likely demand ischemia in the setting of acute illness, with a markedly elevated leukocytosis and CT concerning for multifocal pneumonia.  Critical care evaluation suggest there is likely a component of cardiogenic edema.  Heparin is plan to continue for a total of 48 hours and then will stop.  She is also on aspirin which we recommended decreasing to 81 mg daily and Plavix 75 mg daily with a history of PAD.  She was hypotensive and had been supported with norepinephrine Troponin increased from [phone]. Since then has made an excellent respiratory recovery.  Stable symptoms, improving overall. Hb low but stable. No overt bleeding. Tele quiet in last 24 hr with addition of metoprolol. Feels well and no longer on O2. Breathing comfortably.  Vital Signs .    Vitals:   07/03/23 2000 07/03/23 2307 07/04/23 0223 07/04/23 0728  BP: (!) 111/58 109/60 (!) 107/58 108/68  Pulse: 83 80 79 82  Resp: 20 20 19 20   Temp: 98.2 F (36.8 C) 98.9 F (37.2 C) 98.9 F (37.2 C) 98 F (36.7 C)  TempSrc: Oral Oral Oral Oral  SpO2: 95% 97% 95% 95%  Weight:   47.7 kg   Height:        Intake/Output Summary (Last 24 hours) at 07/04/2023 0906 Last data filed at 07/04/2023 0224 Gross per 24 hour  Intake 180 ml  Output 1300 ml  Net -1120 ml      07/04/2023    2:23 AM  07/03/2023    4:03 PM 07/03/2023    5:00 AM  Last 3 Weights  Weight (lbs) 105 lb 2.6 oz 108 lb 11 oz 108 lb 3.9 oz  Weight (kg) 47.7 kg 49.3 kg 49.1 kg      Telemetry/ECG    SR - Personally Reviewed  Physical Exam .   GEN: NAD Neck: JVD to lower 1/3 of neck. Severe cervical kyphosis. Cardiac: RRR, no murmurs, rubs, or gallops.  Respiratory: Clear to auscultation bilaterally. GI: Soft, nontender, non-distended  MS: No edema  Assessment & Plan .     #NSTEMI #HFrEF, acute #CAC, suspect ischemia -troponin elevated with chest pain during episode. Suspect ischemia with heavy CAC on CT. However, with anemia and jehovah's witness who will not receive blood products, need to manage conservatively for now. Ischemic workup only if no bleeding source and Hb normalizes. Pt agrees.  - deescalation to furosemide 40 mg po daily based on UOP, no am labs available for review I have ordered now. - heparin IV 48 hr therapy completed 06/29/23.  - continues on dapt for PAD indication, asa 81 mg daily and plavix 75 mg daily  - Medical management recommended. Pt and I had a long conversation about cath vs medical therapy, she would like to try to get stronger and reconsider cath as an outpatient, this is appropriate.  -metoprolol succinate 12.5 mg daily, tele quiet. BP stable.  - consider adding spironolactone today 12.5 mg daily,  no morning labs to guide diuresis or therapy. - start jardience 10 mg daily. - bp limits addition of further GDMT  #Anemic -downtrended, then recovered, per primary service. May limit possibility of interventions if anemia persists. Jehovah's witness, no blood products can be administered. No am cbc to review.  #possible atrial fibrillation - Amio 200 mg po Bid to finish load, then after 5 days tx to amio 200 mg daily. OAC deferred given brief nature of AF and low Hb currently DAPT. No recurrence of possible atrial arrhythmia in >72 hours.     For questions or updates, please  contact [facility] Please consult [email or web address] for contact info under        Signed, Parke Poisson, MD

## 2023-07-04 NOTE — Progress Notes (Signed)
 PROGRESS NOTE    Ann Powell  WUJ:[id number] DOB: 01-10-1941 DOA: 06/27/2023 PCP: Donita Brooks, MD    Brief Narrative:  83 year old with history of peripheral artery disease, hypertension, hyperlipidemia presented to the emergency room on 3/28 with sudden onset of diaphoresis and shortness of breath while walking.  Lives at home with her niece.  Family has reported gradual activity intolerance since last 2 months.  In the emergency room she was in respiratory distress.  Oxygen saturation 70%.  Did not respond to nonrebreather vomited and she got more lethargic so she had to be intubated.  On presentation, chest x-ray with diffuse pulmonary edema.  VBG with respiratory acidosis.  EKG with diffuse ST depressions. 3/28, admitted with respiratory distress intubated and treated for pulmonary edema and pneumonia. 3/30, extubated to nasal cannula oxygen. Patient developed atrial tachycardia on 3/30 suspected A-fib and treated with amiodarone bolus and drip.  Slightly elevated troponins. 4/3, out of ICU to medical floor on minimum oxygen. /4, clinically improving.  On room air now.    Subjective: Patient seen and examined.  Patient herself is pleasant interactive.  She denies any complaints.  She is on room air at rest.  Telemetry monitor with mostly sinus rhythm.  Morning labs pending.  Patient is home alone at times and wants to explore if she can go to a rehab.   Assessment & Plan:   Acute hypoxemic respiratory failure secondary to pulmonary edema Acute systolic congestive heart failure with cardiogenic shock Non-STEMI  Patient was treated with mechanical ventilation and currently improved.  She has been weaned off to oxygen and now on room air.    Continue chest physiotherapy and incentive spirometry. Ejection fraction with 35 to 40% with regional wall motion abnormality.  Initially treated with IV Lasix and now on oral Lasix.  On Toprol-XL.  Seen by cardiology.  Initially on  heparin infusion and completed 48 hours of heparin infusion. Started on dual antiplatelet therapy with aspirin and Plavix along with a statin. Cardiology continues to follow.  Currently deferring ischemic evaluation.  May need outpatient follow-up and repeat echocardiogram. Additional GDMT pending renal functions today.  Suspected pneumonia: This was likely pulmonary edema.  Cultures negative.  RVP panel negative.  Completed 5 days of Rocephin therapy.  Continue chest pressure therapy.  Improved.  A-fib with RVR, atrial tachycardia: Developed during acute illnesses.  Patient was treated with amiodarone infusion and now on oral amiodarone taper along with beta-blockers.  She can likely gradually come off amiodarone.  Cardiology following.  Not on anticoagulation because of chronic anemia, already being on DAPT. Plan is to load amiodarone and discharged on maintenance amiodarone with outpatient follow-up.  Anemia of chronic disease: Hemoglobin is stable.  Jehovah's Witness.  No for any blood transfusions.  Physical debility: Working with PT OT.    DVT prophylaxis: heparin injection 5,000 Units Start: 06/30/23 0000   Code Status: Full code Family Communication: None today. Disposition Plan: Status is: Inpatient Remains inpatient appropriate because: Medication monitoring.  Probably needs skilled nursing facility.     Consultants:  Cardiology Critical care  Procedures:  Intubation  Antimicrobials:  Completed     Objective: Vitals:   07/04/23 0223 07/04/23 0728 07/04/23 0952 07/04/23 1055  BP: (!) 107/58 108/68 108/66 (!) 111/59  Pulse: 79 82 82 78  Resp: 19 20  20   Temp: 98.9 F (37.2 C) 98 F (36.7 C)  98 F (36.7 C)  TempSrc: Oral Oral  Oral  SpO2: 95% 95%  95%  Weight: 47.7 kg     Height:        Intake/Output Summary (Last 24 hours) at 07/04/2023 1126 Last data filed at 07/04/2023 1032 Gross per 24 hour  Intake 180 ml  Output 2200 ml  Net -2020 ml   Filed  Weights   07/03/23 0500 07/03/23 1603 07/04/23 0223  Weight: 49.1 kg 49.3 kg 47.7 kg    Examination:  General exam: Calm and comfortable.  On room air.  Pleasant interactive. Respiratory system: Clear to auscultation. Respiratory effort normal.  On room air. Cardiovascular system: S1 & S2 heard, RRR. No JVD, murmurs, rubs, gallops or clicks. No pedal edema. Gastrointestinal system: Abdomen is nondistended, soft and nontender. No organomegaly or masses felt. Normal bowel sounds heard. Central nervous system: Alert and oriented. No focal neurological deficits. Extremities: Symmetric 5 x 5 power. Skin: No rashes, lesions or ulcers Psychiatry: Judgement and insight appear normal. Mood & affect appropriate.     Data Reviewed: I have personally reviewed following labs and imaging studies  CBC: Recent Labs  Lab 06/27/23 1142 06/27/23 1147 06/27/23 1528 06/27/23 2111 06/29/23 0506 06/30/23 1111 07/01/23 0312 07/02/23 0513  WBC 35.2*  --  27.5*  --  9.0 13.2* 11.7* 9.4  NEUTROABS 19.7*  --   --   --   --   --   --   --   HGB 11.8*   < > 11.7* 9.2* 7.6* 9.9* 8.7* 8.7*  HCT 41.8   < > 40.2 27.0* 25.6* 33.4* 28.7* 28.9*  MCV 100.5*  --  98.5  --  96.2 95.4 94.7 95.1  PLT 1,204*  --  723*  --  476* 703* 546* 522*   < > = values in this interval not displayed.   Basic Metabolic Panel: Recent Labs  Lab 06/28/23 1635 06/29/23 0506 06/30/23 0218 06/30/23 1843 07/01/23 2142 07/02/23 0513 07/03/23 0516  NA  --  142 140 141 139 140 139  K  --  3.8 3.7 3.1* 3.8 3.6 4.2  CL  --  107 104 102 100 102 99  CO2  --  19* 23 28 29 31 28   GLUCOSE  --  128* 120* 119* 118* 114* 101*  BUN  --  24* 22 18 20 17  25*  CREATININE  --  0.90 0.89 0.86 0.97 0.92 0.93  CALCIUM  --  8.7* 8.8* 9.2 8.8* 8.5* 8.6*  MG 2.0 2.0  --  2.0 2.0  --  2.0  PHOS 3.4 2.8  --   --   --   --   --    GFR: Estimated Creatinine Clearance: 35.7 mL/min (by C-G formula based on SCr of 0.93 mg/dL). Liver Function  Tests: Recent Labs  Lab 06/27/23 1142  AST 42*  ALT 17  ALKPHOS 66  BILITOT 1.1  PROT 7.1  ALBUMIN 3.8   No results for input(s): "LIPASE", "AMYLASE" in the last 168 hours. No results for input(s): "AMMONIA" in the last 168 hours. Coagulation Profile: Recent Labs  Lab 06/27/23 1432  INR 1.1   Cardiac Enzymes: No results for input(s): "CKTOTAL", "CKMB", "CKMBINDEX", "TROPONINI" in the last 168 hours. BNP (last 3 results) No results for input(s): "PROBNP" in the last 8760 hours. HbA1C: No results for input(s): "HGBA1C" in the last 72 hours. CBG: Recent Labs  Lab 07/03/23 1110 07/03/23 1527 07/03/23 2204 07/04/23 0612 07/04/23 1053  GLUCAP 84 82 118* 97 84   Lipid Profile: No results for input(s): "CHOL", "HDL", "LDLCALC", "  TRIG", "CHOLHDL", "LDLDIRECT" in the last 72 hours. Thyroid Function Tests: No results for input(s): "TSH", "T4TOTAL", "FREET4", "T3FREE", "THYROIDAB" in the last 72 hours. Anemia Panel: No results for input(s): "VITAMINB12", "FOLATE", "FERRITIN", "TIBC", "IRON", "RETICCTPCT" in the last 72 hours. Sepsis Labs: Recent Labs  Lab 06/27/23 1341 06/28/23 1320  PROCALCITON  --  0.70  LATICACIDVEN 2.7* 2.5*    Recent Results (from the past 240 hours)  MRSA Next Gen by PCR, Nasal     Status: None   Collection Time: 06/27/23 11:19 AM  Result Value Ref Range Status   MRSA by PCR Next Gen NOT DETECTED NOT DETECTED Final    Comment: (NOTE) The GeneXpert MRSA Assay (FDA approved for NASAL specimens only), is one component of a comprehensive MRSA colonization surveillance program. It is not intended to diagnose MRSA infection nor to guide or monitor treatment for MRSA infections. Test performance is not FDA approved in patients less than 86 years old. Performed at [facility], [address] [place], [place] [address]   Culture, blood (routine x 2)     Status: None   Collection Time: 06/27/23 12:08 PM   Specimen: BLOOD LEFT HAND  Result  Value Ref Range Status   Specimen Description BLOOD LEFT HAND  Final   Special Requests   Final    AEROBIC BOTTLE ONLY Blood Culture results may not be optimal due to an inadequate volume of blood received in culture bottles   Culture   Final    NO GROWTH 5 DAYS Performed at [facility], [address] [place], [place] [address]    Report Status 07/02/2023 FINAL  Final  Culture, blood (routine x 2)     Status: None   Collection Time: 06/27/23 12:13 PM   Specimen: BLOOD RIGHT WRIST  Result Value Ref Range Status   Specimen Description BLOOD RIGHT WRIST  Final   Special Requests   Final    BOTTLES DRAWN AEROBIC AND ANAEROBIC Blood Culture results may not be optimal due to an inadequate volume of blood received in culture bottles   Culture   Final    NO GROWTH 5 DAYS Performed at [facility], [address], [place], [place] [address]    Report Status 07/02/2023 FINAL  Final  Culture, blood (Routine X 2) w Reflex to ID Panel     Status: None   Collection Time: 06/27/23  3:28 PM   Specimen: BLOOD RIGHT ARM  Result Value Ref Range Status   Specimen Description BLOOD RIGHT ARM  Final   Special Requests   Final    AEROBIC BOTTLE ONLY Blood Culture results may not be optimal due to an inadequate volume of blood received in culture bottles   Culture   Final    NO GROWTH 5 DAYS Performed at [facility], [address], [place], [place] [address]    Report Status 07/02/2023 FINAL  Final  Respiratory (~20 pathogens) panel by PCR     Status: None   Collection Time: 06/27/23  3:51 PM   Specimen: Nasopharyngeal Swab; Respiratory  Result Value Ref Range Status   Adenovirus NOT DETECTED NOT DETECTED Final   Coronavirus 229E NOT DETECTED NOT DETECTED Final    Comment: (NOTE) The Coronavirus on the Respiratory Panel, DOES NOT test for the novel  Coronavirus (2019 nCoV)    Coronavirus HKU1 NOT DETECTED NOT DETECTED Final   Coronavirus NL63 NOT DETECTED NOT DETECTED Final    Coronavirus  OC43 NOT DETECTED NOT DETECTED Final   Metapneumovirus NOT DETECTED NOT DETECTED Final   Rhinovirus / Enterovirus NOT DETECTED NOT DETECTED Final   Influenza A NOT DETECTED NOT DETECTED Final   Influenza B NOT DETECTED NOT DETECTED Final   Parainfluenza Virus 1 NOT DETECTED NOT DETECTED Final   Parainfluenza Virus 2 NOT DETECTED NOT DETECTED Final   Parainfluenza Virus 3 NOT DETECTED NOT DETECTED Final   Parainfluenza Virus 4 NOT DETECTED NOT DETECTED Final   Respiratory Syncytial Virus NOT DETECTED NOT DETECTED Final   Bordetella pertussis NOT DETECTED NOT DETECTED Final   Bordetella Parapertussis NOT DETECTED NOT DETECTED Final   Chlamydophila pneumoniae NOT DETECTED NOT DETECTED Final   Mycoplasma pneumoniae NOT DETECTED NOT DETECTED Final    Comment: Performed at [facility], [address] [place], [place] [address]  SARS Coronavirus 2 by RT PCR (hospital order, performed in [facility] hospital lab) *cepheid single result test* Anterior Nasal Swab     Status: None   Collection Time: 06/27/23  3:51 PM   Specimen: Anterior Nasal Swab  Result Value Ref Range Status   SARS Coronavirus 2 by RT PCR NEGATIVE NEGATIVE Final    Comment: Performed at [facility], [address], [place], [place] [address]  Culture, blood (Routine X 2) w Reflex to ID Panel     Status: None   Collection Time: 06/27/23  6:55 PM   Specimen: BLOOD  Result Value Ref Range Status   Specimen Description BLOOD SITE NOT SPECIFIED  Final   Special Requests   Final    BOTTLES DRAWN AEROBIC AND ANAEROBIC Blood Culture results may not be optimal due to an inadequate volume of blood received in culture bottles   Culture   Final    NO GROWTH 5 DAYS Performed at [facility], [address], [place], [place] [address]    Report Status 07/02/2023 FINAL  Final  Culture, Respiratory w Gram Stain (tracheal aspirate)     Status: None   Collection Time: 06/28/23  8:45 PM   Specimen:  Tracheal Aspirate; Respiratory  Result Value Ref Range Status   Specimen Description TRACHEAL ASPIRATE  Final   Special Requests NONE  Final   Gram Stain   Final    FEW WBC PRESENT,BOTH PMN AND MONONUCLEAR RARE GRAM POSITIVE COCCI IN PAIRS    Culture   Final    MODERATE Normal respiratory flora-no Staph aureus or Pseudomonas seen Performed at [facility], [address], [place], [place] [address]    Report Status 07/01/2023 FINAL  Final         Radiology Studies: No results found.      Scheduled Meds:  acetaminophen  650 mg Oral Q6H   amiodarone  200 mg Oral BID   [START ON 07/07/2023] amiodarone  200 mg Oral Daily   aspirin  81 mg Oral Daily   Chlorhexidine Gluconate Cloth  6 each Topical Daily   clopidogrel  75 mg Oral Daily   docusate sodium  100 mg Oral BID   empagliflozin  10 mg Oral Daily   famotidine  20 mg Oral Daily   feeding supplement  237 mL Oral BID BM   [START ON 07/05/2023] furosemide  40 mg Oral Daily   heparin injection (subcutaneous)  5,000 Units Subcutaneous Q8H   ipratropium-albuterol  3 mL Nebulization Once   magnesium oxide  200 mg Oral Daily   metoprolol succinate  12.5 mg Oral  Daily   multivitamin with minerals  1 tablet Oral Daily   polyethylene glycol  17 g Oral Daily   potassium chloride  20 mEq Oral Daily   rosuvastatin  20 mg Oral Daily   Continuous Infusions:   LOS: 7 days    Time spent: 40 minutes    Dorcas Carrow, MD [facility]

## 2023-07-04 NOTE — Care Management Important Message (Signed)
 Important Message  Patient Details  Name: Ann Powell MRN: [id number] Date of Birth: [birth year]   Important Message Given:  Yes - Medicare IM     Dorena Bodo 07/04/2023, 2:28 PM

## 2023-07-04 NOTE — Progress Notes (Signed)
 Ordered repeat limited Echo per Dr. Jacques Navy after admission for NSTEMI and acute systolic CHF which was treated medically. Please see Dr. Jacques Navy rounding note from today for more information.  Corrin Parker, PA-C 07/04/2023 9:17 AM

## 2023-07-04 NOTE — NC FL2 (Signed)
 [place] MEDICAID FL2 LEVEL OF CARE FORM     IDENTIFICATION  Patient Name: Ann Powell Birthdate: 1940-08-10 Sex: female Admission Date (Current Location): 06/27/2023  [facility] and [place] Number:  Producer, television/film/video and Address:  [facility], [address], [place], [place] [address]      Provider Number: [id number]  Attending Physician Name and Address:  Dorcas Carrow, MD  Relative Name and Phone Number:  Martha Ellerby; Niece; [phone]    Current Level of Care: Hospital Recommended Level of Care: Skilled Nursing Facility Prior Approval Number:    Date Approved/Denied:   PASRR Number: [id number]  Discharge Plan: SNF    Current Diagnoses: Patient Active Problem List   Diagnosis Date Noted   Acute respiratory failure (HCC) 06/27/2023   NSTEMI (non-ST elevated myocardial infarction) (HCC) 06/27/2023   Acute systolic heart failure (HCC) 06/27/2023   PVD (peripheral vascular disease) (HCC)    Hypertension 01/29/2017   Edema of right lower extremity 09/25/2016   Gangrenous (HCC) 09/25/2016   Pain in extremity 09/25/2016   PAD (peripheral artery disease) (HCC) 09/25/2016   Critical lower limb ischemia (HCC) 08/22/2016    Orientation RESPIRATION BLADDER Height & Weight     Self, Time, Situation, Place  Normal (Room Air) Continent Weight: 105 lb 2.6 oz (47.7 kg) Height:  5\' 2"  (157.5 cm)  BEHAVIORAL SYMPTOMS/MOOD NEUROLOGICAL BOWEL NUTRITION STATUS      Continent Diet (Please see dc summary)  AMBULATORY STATUS COMMUNICATION OF NEEDS Skin   Limited Assist Verbally Other (Comment) (Wound / Incision (Open or Dehisced) 07/01/23 Other (Comment) Toe Right;Other (Comment); Wound / Incision (Open or Dehisced) 06/30/23 Skin tear Perineum Medial; Wound / Incision (Open or Dehisced) 06/30/23 Skin tear Buttocks Medial)                       Personal Care Assistance Level of Assistance  Bathing, Dressing, Feeding Bathing Assistance:  Limited assistance Feeding assistance: Limited assistance Dressing Assistance: Limited assistance     Functional Limitations Info             SPECIAL CARE FACTORS FREQUENCY  PT (By licensed PT), OT (By licensed OT)     PT Frequency: 5x OT Frequency: 5x            Contractures Contractures Info: Not present    Additional Factors Info  Code Status, Allergies, Insulin Sliding Scale Code Status Info: Full Code Allergies Info: NKA   Insulin Sliding Scale Info: Please see dc summary       Current Medications (07/04/2023):  This is the current hospital active medication list Current Facility-Administered Medications  Medication Dose Route Frequency Provider Last Rate Last Admin   acetaminophen (TYLENOL) tablet 650 mg  650 mg Oral Q6H Paliwal, Aditya, MD   650 mg at 07/04/23 0952   alum & mag hydroxide-simeth (MAALOX/MYLANTA) 200-200-20 MG/5ML suspension 30 mL  30 mL Oral Q6H PRN Carilyn Goodpasture, MD   30 mL at 06/30/23 0755   amiodarone (PACERONE) tablet 200 mg  200 mg Oral BID Weston Brass A, MD   200 mg at 07/04/23 0954   [START ON 07/07/2023] amiodarone (PACERONE) tablet 200 mg  200 mg Oral Daily Parke Poisson, MD       aspirin chewable tablet 81 mg  81 mg Oral Daily Knute Neu, RPH   81 mg at 07/04/23 0102   Chlorhexidine Gluconate Cloth 2 % PADS 6 each  6 each Topical Daily  Leslye Peer, MD   6 each at 07/04/23 [phone]   clopidogrel (PLAVIX) tablet 75 mg  75 mg Oral Daily Knute Neu, RPH   75 mg at 07/04/23 6962   docusate sodium (COLACE) capsule 100 mg  100 mg Oral BID Knute Neu, RPH   100 mg at 07/04/23 9528   empagliflozin (JARDIANCE) tablet 10 mg  10 mg Oral Daily Weston Brass A, MD   10 mg at 07/04/23 0954   famotidine (PEPCID) tablet 20 mg  20 mg Oral Daily Knute Neu, RPH   20 mg at 07/04/23 4132   feeding supplement (ENSURE ENLIVE / ENSURE PLUS) liquid 237 mL  237 mL Oral BID BM Leslye Peer, MD   237 mL at 07/03/23 0822   [START ON  07/05/2023] furosemide (LASIX) tablet 40 mg  40 mg Oral Daily Weston Brass A, MD       heparin injection 5,000 Units  5,000 Units Subcutaneous Q8H Leslye Peer, MD   5,000 Units at 07/04/23 0604   ipratropium-albuterol (DUONEB) 0.5-2.5 (3) MG/3ML nebulizer solution 3 mL  3 mL Nebulization Once Simonne Martinet, NP       ipratropium-albuterol (DUONEB) 0.5-2.5 (3) MG/3ML nebulizer solution 3 mL  3 mL Nebulization Q6H PRN Simonne Martinet, NP   3 mL at 06/30/23 2031   magnesium oxide (MAG-OX) tablet 200 mg  200 mg Oral Daily Patrici Ranks, MD   200 mg at 07/04/23 4401   metoprolol succinate (TOPROL-XL) 24 hr tablet 12.5 mg  12.5 mg Oral Daily Weston Brass A, MD   12.5 mg at 07/04/23 0272   multivitamin with minerals tablet 1 tablet  1 tablet Oral Daily Leslye Peer, MD   1 tablet at 07/04/23 5366   nitroGLYCERIN (NITROSTAT) SL tablet 0.4 mg  0.4 mg Sublingual Q5 min PRN Simonne Martinet, NP   0.4 mg at 06/27/23 1152   Oral care mouth rinse  15 mL Mouth Rinse PRN Leslye Peer, MD       polyethylene glycol (MIRALAX / GLYCOLAX) packet 17 g  17 g Oral Daily PRN Simonne Martinet, NP       polyethylene glycol (MIRALAX / GLYCOLAX) packet 17 g  17 g Oral Daily Rutherford Nail Z, RPH       potassium chloride SA (KLOR-CON M) CR tablet 20 mEq  20 mEq Oral Daily Patrici Ranks, MD   20 mEq at 07/04/23 4403   rosuvastatin (CRESTOR) tablet 20 mg  20 mg Oral Daily Patrici Ranks, MD   20 mg at 07/04/23 4742     Discharge Medications: Please see discharge summary for a list of discharge medications.  Relevant Imaging Results:  Relevant Lab Results:   Additional Information SSN: [id number]  Marliss Coots, LCSW

## 2023-07-04 NOTE — Progress Notes (Signed)
 Physical Therapy Treatment Patient Details Name: Ann Powell MRN: [id number] DOB: 01-15-1941 Today's Date: 07/04/2023   History of Present Illness Pt is 83 year old presented to [facility] on  06/27/23 for respiratory failure, NSTEMI, and pulmonary edema. Intubated in ED. Extubated 06/29/23. PMH - PAD, HTN    PT Comments  Pt resting in bed on arrival, eager for mobility. Pt continues to be limited in safe mobility by decreased activity tolerance, impaired balance/postural reactions and general weakness. Pt requiring up to mod A during gait with HHA support to maintain balance and prevent fall as pt with poor postural reactions, scissoring steps and requiring outside physical assist to regain balance. Without HHA pt requiring mod A to maintain balance with pt reaching for external support. Pt able to come to stand from EOB with min A to steady on rise. SpO2 maintaining >90% on RA when good pleth reading available. Patient will benefit from continued inpatient follow up therapy, <3 hours/day to address deficits and maximize functional independence as pt remains at increased risk for falls. Pt continues to benefit from skilled PT services to progress toward functional mobility goals.      If plan is discharge home, recommend the following: Assistance with cooking/housework;Help with stairs or ramp for entrance;Assist for transportation;A little help with bathing/dressing/bathroom;A little help with walking and/or transfers   Can travel by private vehicle     Yes  Equipment Recommendations  None recommended by PT (pt has cane and RW but does not typically use these in her home)    Recommendations for Other Services       Precautions / Restrictions Precautions Precautions: Fall Recall of Precautions/Restrictions: Intact Precaution/Restrictions Comments: monitor O2 (difficult signal) Restrictions Weight Bearing Restrictions Per Provider Order: No     Mobility  Bed Mobility Overal bed  mobility: Needs Assistance Bed Mobility: Supine to Sit     Supine to sit: Supervision     General bed mobility comments: increased effort, use of bed features; PTA assist with line mgmt    Transfers Overall transfer level: Needs assistance Equipment used: None Transfers: Sit to/from Stand Sit to Stand: Min assist           General transfer comment: min A to steady on rise with slow ascent to standing and pt reaching for bed rail to self stedy on contralateral side    Ambulation/Gait Ambulation/Gait assistance: Min assist, Mod assist Gait Distance (Feet): 100 Feet Assistive device: 1 person hand held assist Gait Pattern/deviations: Step-through pattern, Decreased stride length, Trunk flexed, Drifts right/left, Wide base of support, Leaning posteriorly Gait velocity: decr     General Gait Details: min A with HHA to maintain balance with hallway ambulation, x2 instances of scissoring steps needing assist to maintain balance and prevent fall. mod A to steady without HHA or AD support as pt reporting she does not use AD in the home.   Stairs             Wheelchair Mobility     Tilt Bed    Modified Rankin (Stroke Patients Only)       Balance Overall balance assessment: Needs assistance Sitting-balance support: No upper extremity supported, Feet supported Sitting balance-Leahy Scale: Good     Standing balance support: No upper extremity supported, During functional activity, Reliant on assistive device for balance Standing balance-Leahy Scale: Poor Standing balance comment: multiple losses of balance with dynamic standing tasks unsupported today; pt does not use AD at home  Communication Communication Communication: Impaired Factors Affecting Communication: Hearing impaired  Cognition Arousal: Alert Behavior During Therapy: WFL for tasks assessed/performed   PT - Cognitive impairments: Safety/Judgement                        PT - Cognition Comments: decreased awareness of deficits/safety; very motivated to progress Following commands: Intact      Cueing Cueing Techniques: Verbal cues  Exercises      General Comments General comments (skin integrity, edema, etc.): Noisy signal on ear and finger, SpO2 mid-90's and up on RA during activity, pt without dyspnea on exertion      Pertinent Vitals/Pain Pain Assessment Pain Assessment: No/denies pain    Home Living                          Prior Function            PT Goals (current goals can now be found in the care plan section) Acute Rehab PT Goals Patient Stated Goal: to get stronger before I go home PT Goal Formulation: With patient/family Time For Goal Achievement: 07/14/23 Progress towards PT goals: Progressing toward goals    Frequency    Min 2X/week      PT Plan      Co-evaluation              AM-PAC PT "6 Clicks" Mobility   Outcome Measure  Help needed turning from your back to your side while in a flat bed without using bedrails?: None Help needed moving from lying on your back to sitting on the side of a flat bed without using bedrails?: A Little Help needed moving to and from a bed to a chair (including a wheelchair)?: A Little Help needed standing up from a chair using your arms (e.g., wheelchair or bedside chair)?: A Little Help needed to walk in hospital room?: A Little Help needed climbing 3-5 steps with a railing? : A Lot 6 Click Score: 18    End of Session   Activity Tolerance: Patient tolerated treatment well Patient left: in chair;with call bell/phone within reach Nurse Communication: Mobility status PT Visit Diagnosis: Other abnormalities of gait and mobility (R26.89);Muscle weakness (generalized) (M62.81);History of falling (Z91.81)     Time: 9563-8756 PT Time Calculation (min) (ACUTE ONLY): 20 min  Charges:    $Gait Training: 8-22 mins PT General Charges $$ ACUTE PT  VISIT: 1 Visit                     Ann Powell R. PTA Acute Rehabilitation Services Office: [phone]   Catalina Antigua 07/04/2023, 12:25 PM

## 2023-07-05 DIAGNOSIS — I214 Non-ST elevation (NSTEMI) myocardial infarction: Secondary | ICD-10-CM | POA: Diagnosis not present

## 2023-07-05 DIAGNOSIS — I5021 Acute systolic (congestive) heart failure: Secondary | ICD-10-CM | POA: Diagnosis not present

## 2023-07-05 DIAGNOSIS — I21A1 Myocardial infarction type 2: Secondary | ICD-10-CM

## 2023-07-05 LAB — GLUCOSE, CAPILLARY
Glucose-Capillary: 99 mg/dL (ref 70–99)
Glucose-Capillary: 120 mg/dL — ABNORMAL HIGH (ref 70–99)
Glucose-Capillary: 112 mg/dL — ABNORMAL HIGH (ref 70–99)
Glucose-Capillary: 132 mg/dL — ABNORMAL HIGH (ref 70–99)

## 2023-07-05 NOTE — Progress Notes (Signed)
   Patient Name: Ann Powell Date of Encounter: 07/05/2023 [facility] Cardiologist: None   Interval Summary  .    Resting comfortably, now on room air, in bed.  Very appreciative.  No apparent increased work of breathing.  Vital Signs .    Vitals:   07/04/23 1946 07/04/23 2350 07/05/23 0400 07/05/23 0538  BP: 112/60 111/64 113/70   Pulse: 80 83 77   Resp: 17 19 19    Temp: 98 F (36.7 C) 98.3 F (36.8 C) 98.1 F (36.7 C)   TempSrc: Oral Oral Oral   SpO2: 98% 100% 100%   Weight:    47.1 kg  Height:        Intake/Output Summary (Last 24 hours) at 07/05/2023 0734 Last data filed at 07/04/2023 1946 Gross per 24 hour  Intake 580 ml  Output 900 ml  Net -320 ml      07/05/2023    5:38 AM 07/04/2023    2:23 AM 07/03/2023    4:03 PM  Last 3 Weights  Weight (lbs) 103 lb 13.4 oz 105 lb 2.6 oz 108 lb 11 oz  Weight (kg) 47.1 kg 47.7 kg 49.3 kg      Telemetry/ECG    Normal sinus rhythm- Personally Reviewed  Physical Exam .   GEN: No acute distress.  Thin, frail Neck: No JVD Cardiac: RRR, no murmurs, rubs, or gallops.  Respiratory: Clear to auscultation bilaterally. GI: Soft, nontender, non-distended  MS: No edema  Assessment & Plan .     83 year old admitted with hypoxic respiratory failure with elevated troponin.  Extensive coronary atherosclerosis noted on CT scan.  Echocardiogram showed EF of about 40% originally.  CT concerning for multifocal pneumonia there was concern about a component of cardiogenic edema.  Type II myocardial infarction-troponin increased 1100 - Aspirin 81 mg utilized, Plavix 75 mg utilized given history of peripheral arterial disease, heparin IV 48 hours, low-dose metoprolol -Continue with supportive care.  No invasive workup at this time.  Lengthy conversation with patient.  Jehovah's Witness.  Can always reconsider cardiac catheterization as outpatient. -There was noted to consider spironolactone 12.5 mg a day, Jardiance 10 mg a  day -Limited echocardiogram has been ordered  Coronary artery disease - Extensive coronary calcification personally reviewed and interpreted from CT scan on 06/27/2023. -Aspirin, Plavix, Crestor 20 mg a day with LDL goal less than 70, optimally less than 55.  Acute systolic heart failure - EF 35 to 40% on regional echo in the setting of hypoxic respiratory failure.  A limited echo has been ordered to reevaluate. - Currently on Toprol 12.5 mg a day, Jardiance 10 mg a day, consider spironolactone 12.5 mg a day creatinine yesterday 1.06 and potassium 4.0.  Blood pressure 113/70  Atrial fibrillation with rapid ventricular response/atrial tachycardia - Not on anticoagulation because of chronic anemia, Jehovah's Witness.  She is on dual antiplatelet therapy. - Received amiodarone load during hospitalization currently on 200 mg a day.  Continue for hopefully short-term.  Hopefully this will be able to be discontinued as outpatient. -Telemetry shows sinus rhythm currently.   Debility/frail-current weight 47 kg.  Complex medical issues.  Comfortable with discharge when able.  For questions or updates, please contact [facility] Please consult [email or web address] for contact info under        Signed, Donato Schultz, MD

## 2023-07-05 NOTE — Progress Notes (Signed)
 Progress Note   Patient: Ann Powell WUJ:[id number] DOB: October 27, 1940 DOA: 06/27/2023     8 DOS: the patient was seen and examined on 07/05/2023   Brief hospital course: 83 year old with history of peripheral artery disease, hypertension, hyperlipidemia presented to the emergency room on 3/28 with sudden onset of diaphoresis and shortness of breath while walking.  Lives at home with her niece.  Family has reported gradual activity intolerance since last 2 months.  In the emergency room she was in respiratory distress.  Oxygen saturation 70%.  Did not respond to nonrebreather vomited and she got more lethargic so she had to be intubated.  On presentation, chest x-ray with diffuse pulmonary edema.  VBG with respiratory acidosis.  EKG with diffuse ST depressions. 3/28, admitted with respiratory distress intubated and treated for pulmonary edema and pneumonia. 3/30, extubated to nasal cannula oxygen. Patient developed atrial tachycardia on 3/30 suspected A-fib and treated with amiodarone bolus and drip.  Slightly elevated troponins. 4/3, out of ICU to medical floor on minimum oxygen. /4, clinically improving.  On room air now.  Assessment and Plan:   Acute hypoxemic respiratory failure secondary to pulmonary edema -Initially requiring intubation, subsequently improved, now weaned down to room air.  Continue chest PT and incentive spirometry.  NSTEMI/acute HFrEF with cardiogenic shock -EF 35 - 40% with regional wall motion abnormality.  Followed closely by cardiology.  Initially treated with heparin drip, IV Lasix.  Transition to p.o. DAPT, Lasix, Toprol-XL.  Currently deferring ischemic evaluation.  Pending repeat limited echo.  Currently on 2/4 GDMT (beta-blocker/Jardiance).   Suspected pneumonia -This was likely pulmonary edema.  Cultures negative.  RVP panel negative.  Completed 5 days of Rocephin therapy.  Ruled out.   A-fib with RVR, atrial tachycardia -Developed during acute illnesses.   Patient was treated with amiodarone infusion and now on oral amiodarone taper along with beta-blockers.  Cardiology following.  Not on anticoagulation because of chronic anemia (Jehovah's Witness), already being on DAPT. Plan is to load amiodarone and discharged on maintenance amiodarone with outpatient follow-up.   Anemia of chronic disease -Hemoglobin is stable.  Jehovah's Witness.  No for any blood transfusions.   Physical debilitation and muscle weakness -Working with PT/OT.  Working to transition to SNF.       Subjective: Patient sitting up in the bedside chair this morning, comfortable, denies any fever, chills, worsening shortness of breath, chest pain, nausea, vomiting, abdominal pain.  Physical Exam: Vitals:   07/04/23 2350 07/05/23 0400 07/05/23 0538 07/05/23 0747  BP: 111/64 113/70  (!) 113/57  Pulse: 83 77  81  Resp: 19 19  20   Temp: 98.3 F (36.8 C) 98.1 F (36.7 C)  97.8 F (36.6 C)  TempSrc: Oral Oral  Oral  SpO2: 100% 100%  99%  Weight:   47.1 kg   Height:       GENERAL:  Alert, pleasant, no acute distress, frail HEENT:  EOMI CARDIOVASCULAR:  RRR, no murmurs appreciated RESPIRATORY:  Clear to auscultation, no wheezing, rales, or rhonchi GASTROINTESTINAL:  Soft, nontender, nondistended EXTREMITIES:  No LE edema bilaterally NEURO:  No new focal deficits appreciated SKIN:  No rashes noted PSYCH:  Appropriate mood and affect   Data Reviewed:  There are no new results to review at this time.  Family Communication: None at bedside  Disposition: Status is: Inpatient Remains inpatient appropriate because: Transition to STR  Planned Discharge Destination: Skilled nursing facility    Time spent: 35 minutes  Author: Deanna Artis,  DO 07/05/2023 12:06 PM  For on call review [email or web address].

## 2023-07-05 NOTE — Plan of Care (Signed)
  Problem: Coping: Goal: Ability to adjust to condition or change in health will improve Outcome: Progressing   Problem: Education: Goal: Individualized Educational Video(s) Outcome: Progressing   Problem: Education: Goal: Ability to describe self-care measures that may prevent or decrease complications (Diabetes Survival Skills Education) will improve Outcome: Progressing

## 2023-07-05 NOTE — TOC Progression Note (Signed)
 Transition of Care [facility]) - Initial/Assessment Note    Patient Details  Name: Ann Powell MRN: [id number] Date of Birth: 1940-11-03  [facility] CM/SW Contact:    Ralene Bathe, LCSW Phone Number: 07/05/2023, 3:32 PM  Clinical Narrative:                 CSW met with patient at bedside and presented bed offers.  Pt accepted bed offer at [facility]. Kia in admissions at [facility] contacted and bed offer has been secured.    Pending insurance authorization.    Expected Discharge Plan: Skilled Nursing Facility Barriers to Discharge: Continued Medical Work up, SNF Pending bed offer   Patient Goals and CMS Choice Patient states their goals for this hospitalization and ongoing recovery are:: SNF          Expected Discharge Plan and Services In-house Referral: Clinical Social Work   Post Acute Care Choice: Skilled Nursing Facility Living arrangements for the past 2 months: Single Family Home                                      Prior Living Arrangements/Services Living arrangements for the past 2 months: Single Family Home Lives with:: Relatives Patient language and need for interpreter reviewed:: Yes Do you feel safe going back to the place where you live?: Yes            Criminal Activity/Legal Involvement Pertinent to Current Situation/Hospitalization: No - Comment as needed  Activities of Daily Living   ADL Screening (condition at time of admission) Independently performs ADLs?: Yes (appropriate for developmental age) Is the patient deaf or have difficulty hearing?: No Does the patient have difficulty seeing, even when wearing glasses/contacts?: No Does the patient have difficulty concentrating, remembering, or making decisions?: No  Permission Sought/Granted Permission sought to share information with : Family Supports Permission granted to share information with : No (Contact information on chart)  Share Information  with NAME: Lillieann Pavlich  Permission granted to share info w AGENCY: SNF  Permission granted to share info w Relationship: Niece  Permission granted to share info w Contact Information: [phone]  Emotional Assessment Appearance:: Appears stated age Attitude/Demeanor/Rapport: Engaged Affect (typically observed): Accepting, Appropriate, Adaptable, Calm, Stable, Pleasant Orientation: : Oriented to Self, Oriented to Place, Oriented to Situation, Oriented to  Time Alcohol / Substance Use: Not Applicable Psych Involvement: No (comment)  Admission diagnosis:  Acute respiratory failure (HCC) [J96.00] Acute respiratory failure with hypoxia and hypercapnia (HCC) [J96.01, J96.02] Patient Active Problem List   Diagnosis Date Noted   Acute respiratory failure (HCC) 06/27/2023   NSTEMI (non-ST elevated myocardial infarction) (HCC) 06/27/2023   Acute systolic heart failure (HCC) 06/27/2023   PVD (peripheral vascular disease) (HCC)    Hypertension 01/29/2017   Edema of right lower extremity 09/25/2016   Gangrenous (HCC) 09/25/2016   Pain in extremity 09/25/2016   PAD (peripheral artery disease) (HCC) 09/25/2016   Critical lower limb ischemia (HCC) 08/22/2016   PCP:  Donita Brooks, MD Pharmacy:   [facility] [id number] [place] Otto, [place] - [address] [place] [address] Phone: [phone] Fax: [phone]  [facility] - [place], [place] - [address] [place] [address] Phone: [phone] Fax: [phone]     Social Drivers of Health (SDOH) Social History:  SDOH Screenings   Food Insecurity: No Food Insecurity (06/29/2023)  Housing: Low Risk  (06/29/2023)  Transportation Needs: No Transportation Needs (06/29/2023)  Utilities: Not At Risk (06/29/2023)  Alcohol Screen: Low Risk  (01/16/2023)  Depression (PHQ2-9): Low Risk  (03/04/2023)  Financial Resource Strain: Low Risk   (01/16/2023)  Physical Activity: Insufficiently Active (01/16/2023)  Social Connections: Moderately Isolated (06/29/2023)  Stress: No Stress Concern Present (01/16/2023)  Tobacco Use: Low Risk  (06/27/2023)  Health Literacy: Adequate Health Literacy (01/16/2023)   SDOH Interventions: Transportation Interventions: Inpatient TOC, Intervention Not Indicated, Patient Resources (Friends/Family)   Readmission Risk Interventions    06/30/2023    2:48 PM  Readmission Risk Prevention Plan  Post Dischage Appt Complete  Medication Screening Complete  Transportation Screening Complete

## 2023-07-06 DIAGNOSIS — I214 Non-ST elevation (NSTEMI) myocardial infarction: Secondary | ICD-10-CM | POA: Diagnosis not present

## 2023-07-06 DIAGNOSIS — I5021 Acute systolic (congestive) heart failure: Secondary | ICD-10-CM | POA: Diagnosis not present

## 2023-07-06 LAB — CBC
HCT: 31.9 % — ABNORMAL LOW (ref 36.0–46.0)
Hemoglobin: 9.4 g/dL — ABNORMAL LOW (ref 12.0–15.0)
MCH: 28.8 pg (ref 26.0–34.0)
MCHC: 29.5 g/dL — ABNORMAL LOW (ref 30.0–36.0)
MCV: 97.9 fL (ref 80.0–100.0)
Platelets: 655 10*3/uL — ABNORMAL HIGH (ref 150–400)
RBC: 3.26 MIL/uL — ABNORMAL LOW (ref 3.87–5.11)
RDW: 20.6 % — ABNORMAL HIGH (ref 11.5–15.5)
WBC: 12 10*3/uL — ABNORMAL HIGH (ref 4.0–10.5)
nRBC: 9.9 % — ABNORMAL HIGH (ref 0.0–0.2)

## 2023-07-06 LAB — BASIC METABOLIC PANEL WITH GFR
Anion gap: 12 (ref 5–15)
BUN: 29 mg/dL — ABNORMAL HIGH (ref 8–23)
CO2: 24 mmol/L (ref 22–32)
Calcium: 9.1 mg/dL (ref 8.9–10.3)
Chloride: 100 mmol/L (ref 98–111)
Creatinine, Ser: 1.16 mg/dL — ABNORMAL HIGH (ref 0.44–1.00)
GFR, Estimated: 47 mL/min — ABNORMAL LOW (ref >60)
Glucose, Bld: 115 mg/dL — ABNORMAL HIGH (ref 70–99)
Potassium: 4.8 mmol/L (ref 3.5–5.1)
Sodium: 136 mmol/L (ref 135–145)

## 2023-07-06 LAB — GLUCOSE, CAPILLARY
Glucose-Capillary: 90 mg/dL (ref 70–99)
Glucose-Capillary: 62 mg/dL — ABNORMAL LOW (ref 70–99)
Glucose-Capillary: 127 mg/dL — ABNORMAL HIGH (ref 70–99)
Glucose-Capillary: 39 mg/dL — CL (ref 70–99)
Glucose-Capillary: 53 mg/dL — ABNORMAL LOW (ref 70–99)
Glucose-Capillary: 48 mg/dL — ABNORMAL LOW (ref 70–99)
Glucose-Capillary: 56 mg/dL — ABNORMAL LOW (ref 70–99)
Glucose-Capillary: 66 mg/dL — ABNORMAL LOW (ref 70–99)
Glucose-Capillary: 75 mg/dL (ref 70–99)
Glucose-Capillary: 113 mg/dL — ABNORMAL HIGH (ref 70–99)

## 2023-07-06 MED ORDER — DEXTROSE 5 % IV SOLN: INTRAVENOUS | Status: AC

## 2023-07-06 MED ORDER — SPIRONOLACTONE 12.5 MG HALF TABLET
12.5000 mg | ORAL_TABLET | Freq: Every day | ORAL | Status: DC
  Administered 2023-07-06 – 2023-07-07 (×2): 12.5 mg via ORAL
  Filled 2023-07-06 (×2): qty 1

## 2023-07-06 MED ORDER — DEXTROSE 5 % IV SOLN: INTRAVENOUS | Status: DC

## 2023-07-06 MED ORDER — DEXTROSE 50 % IV SOLN
INTRAVENOUS | Status: AC
  Filled 2023-07-06: qty 50

## 2023-07-06 MED ORDER — DEXTROSE 50 % IV SOLN
25.0000 g | INTRAVENOUS | Status: AC
  Administered 2023-07-06: 25 g via INTRAVENOUS
  Filled 2023-07-06: qty 50

## 2023-07-06 NOTE — Progress Notes (Signed)
 Mobility Specialist Progress Note;   07/06/23 0955  Mobility  Activity Transferred from bed to chair  Level of Assistance Contact guard assist, steadying assist  Assistive Device Other (Comment) (HHA)  Distance Ambulated (ft) 5 ft  Activity Response Tolerated well  Mobility Referral Yes  Mobility visit 1 Mobility  Mobility Specialist Start Time (ACUTE ONLY) 0955  Mobility Specialist Stop Time (ACUTE ONLY) 1001  Mobility Specialist Time Calculation (min) (ACUTE ONLY) 6 min   Pt eager for mobility. Required MinG assistance via HHA to safely transfer pt from bed to chair. VSS on RA. No c/o when asked. Pt left in chair with all needs met, alarm on.   Caesar Bookman Mobility Specialist Please contact via SecureChat or [employer] [phone]

## 2023-07-06 NOTE — Progress Notes (Signed)
   Patient Name: Ann Powell May Thane Date of Encounter: 07/06/2023 [facility] Cardiologist: None   Interval Summary  .    Comfortable, no chest pain.   Vital Signs .    Vitals:   07/05/23 1917 07/05/23 2336 07/06/23 0319 07/06/23 0611  BP: 122/64 117/65 121/65   Pulse: 83 85 76   Resp: 20 20 17    Temp: 98.1 F (36.7 C) 98.5 F (36.9 C) 97.8 F (36.6 C)   TempSrc: Oral Oral Oral   SpO2: 99% 100% 100%   Weight:    47.1 kg  Height:        Intake/Output Summary (Last 24 hours) at 07/06/2023 0800 Last data filed at 07/05/2023 1917 Gross per 24 hour  Intake 120 ml  Output --  Net 120 ml      07/06/2023    6:11 AM 07/05/2023    5:38 AM 07/04/2023    2:23 AM  Last 3 Weights  Weight (lbs) 103 lb 13.4 oz 103 lb 13.4 oz 105 lb 2.6 oz  Weight (kg) 47.1 kg 47.1 kg 47.7 kg      Telemetry/ECG    Normal sinus rhythm, no afib- Personally Reviewed  Physical Exam .   GEN: No acute distress.  Thin, frail Neck: No JVD Cardiac: RRR, no murmurs, rubs, or gallops.  Respiratory: Clear to auscultation bilaterally. GI: Soft, nontender, non-distended  MS: No edema  Assessment & Plan .     83 year old admitted with hypoxic respiratory failure with elevated troponin.  Extensive coronary atherosclerosis noted on CT scan.  Echocardiogram showed EF of about 40% originally.  CT concerning for multifocal pneumonia there was concern about a component of cardiogenic edema.  Type II myocardial infarction-troponin increased 1100 - Aspirin 81 mg utilized, Plavix 75 mg utilized given history of peripheral arterial disease, heparin IV 48 hours, low-dose metoprolol -Continue with supportive care.  No invasive workup at this time.  Lengthy conversation with patient.  Jehovah's Witness.  Can always reconsider cardiac catheterization as outpatient. -There was noted to consider spironolactone 12.5 mg a day, Jardiance 10 mg a day -Limited echocardiogram has been ordered but this does not need to get  done if d/c is ready. Can always check as an outpatient.   Coronary artery disease - Extensive coronary calcification personally reviewed and interpreted from CT scan on 06/27/2023. -Aspirin, Plavix, Crestor 20 mg a day with LDL goal less than 70, optimally less than 55. No new changes  Acute systolic heart failure - EF 35 to 40% on regional echo in the setting of hypoxic respiratory failure.  83 limited echo has been ordered to reevaluate. - Currently on Toprol 12.5 mg a day, Jardiance 10 mg a day, consider spironolactone 12.5 mg a day creatinine yesterday 1.06 and potassium 4.0.  Blood pressure 107/55. No new changes.   Atrial fibrillation with rapid ventricular response/atrial tachycardia - Not on anticoagulation because of chronic anemia, Jehovah's Witness.  She is on dual antiplatelet therapy. - Received amiodarone load during hospitalization currently on 200 mg a day.  Continue for hopefully short-term.  Hopefully this will be able to be discontinued as outpatient. -Telemetry shows sinus rhythm currently. No changes   Debility/frail-current weight 47 kg.  Complex medical issues.  Comfortable with discharge when able. Will sign off.   For questions or updates, please contact [facility] Please consult [email or web address] for contact info under        Signed, Donato Schultz, MD

## 2023-07-06 NOTE — Progress Notes (Signed)
 Pt noted to have multiple episodes of hypoglycemia, despite increased feeding, juice, and d50.  Not taking insulin, only jardiance.  Will DC jardiance for now.  Order CBC, BMP, UA, start d5 @ 3ml/hr for the next 12 hours.

## 2023-07-06 NOTE — Progress Notes (Signed)
 Progress Note   Patient: Ann Powell MVH:[id number] DOB: 02-Jul-1940 DOA: 06/27/2023     9 DOS: the patient was seen and examined on 07/06/2023   Brief hospital course: 83 year old with history of peripheral artery disease, hypertension, hyperlipidemia presented to the emergency room on 3/28 with sudden onset of diaphoresis and shortness of breath while walking.  Lives at home with her niece.  Family has reported gradual activity intolerance since last 2 months.  In the emergency room she was in respiratory distress.  Oxygen saturation 70%.  Did not respond to nonrebreather vomited and she got more lethargic so she had to be intubated.  On presentation, chest x-ray with diffuse pulmonary edema.  VBG with respiratory acidosis.  EKG with diffuse ST depressions. 3/28, admitted with respiratory distress intubated and treated for pulmonary edema and pneumonia. 3/30, extubated to nasal cannula oxygen. Patient developed atrial tachycardia on 3/30 suspected A-fib and treated with amiodarone bolus and drip.  Slightly elevated troponins. 4/3, out of ICU to medical floor on minimum oxygen. /4, clinically improving.  On room air now.  Assessment and Plan:   Acute hypoxemic respiratory failure secondary to pulmonary edema -Initially requiring intubation, subsequently improved, now weaned down to room air.  Continue chest PT and incentive spirometry.  NSTEMI/acute HFrEF with cardiogenic shock -EF 35 - 40% with regional wall motion abnormality.  Followed closely by cardiology.  Initially treated with heparin drip, IV Lasix.  Transition to p.o. DAPT, Lasix, Toprol-XL.  Currently deferring ischemic evaluation.  Pending repeat limited echo.  Currently on 2/4 GDMT (beta-blocker/Jardiance).  Will initiate spironolactone 12.5 mg daily today.   Suspected pneumonia -This was likely pulmonary edema.  Cultures negative.  RVP panel negative.  Completed 5 days of Rocephin therapy.  Ruled out.   A-fib with RVR,  atrial tachycardia -Developed during acute illnesses.  Patient was treated with amiodarone infusion and now on oral amiodarone taper along with beta-blockers.  Cardiology following.  Not on anticoagulation because of chronic anemia (Jehovah's Witness), already being on DAPT. Plan is to load amiodarone and discharged on maintenance amiodarone with outpatient follow-up.   Anemia of chronic disease -Hemoglobin is stable.  Jehovah's Witness.  No for any blood transfusions.   Physical debilitation and muscle weakness -Working with PT/OT.  Working to transition to SNF.  Currently pending bed offer.  Plan to DC 1-3 days.       Subjective: Patient sitting up in the bed this morning, comfortable, denies any fever, chills, worsening shortness of breath, chest pain, nausea, vomiting, abdominal pain.  Feels well otherwise.  Physical Exam: Vitals:   07/06/23 0319 07/06/23 0611 07/06/23 0800 07/06/23 1027  BP: 121/65  (!) 107/55 117/66  Pulse: 76     Resp: 17     Temp: 97.8 F (36.6 C)  97.8 F (36.6 C) 97.6 F (36.4 C)  TempSrc: Oral  Oral Oral  SpO2: 100%     Weight:  47.1 kg    Height:       GENERAL:  Alert, pleasant, no acute distress, frail HEENT:  EOMI CARDIOVASCULAR:  RRR, no murmurs appreciated RESPIRATORY:  Clear to auscultation, no wheezing, rales, or rhonchi GASTROINTESTINAL:  Soft, nontender, nondistended EXTREMITIES:  No LE edema bilaterally NEURO:  No new focal deficits appreciated SKIN:  No rashes noted PSYCH:  Appropriate mood and affect   Data Reviewed:  There are no new results to review at this time.  Family Communication: None at bedside  Disposition: Status is: Inpatient Remains inpatient appropriate because:  Transition to STR  Planned Discharge Destination: Skilled nursing facility    Time spent: 33 minutes  Author: Deanna Artis, DO 07/06/2023 10:33 AM  For on call review [email or web address].

## 2023-07-06 NOTE — Plan of Care (Signed)
  Problem: Coping: Goal: Ability to adjust to condition or change in health will improve Outcome: Progressing   Problem: Fluid Volume: Goal: Ability to maintain a balanced intake and output will improve Outcome: Progressing   Problem: Health Behavior/Discharge Planning: Goal: Ability to manage health-related needs will improve Outcome: Progressing   Problem: Nutritional: Goal: Maintenance of adequate nutrition will improve Outcome: Progressing   Problem: Education: Goal: Knowledge of General Education information will improve Description: Including pain rating scale, medication(s)/side effects and non-pharmacologic comfort measures Outcome: Progressing   Problem: Health Behavior/Discharge Planning: Goal: Ability to manage health-related needs will improve Outcome: Progressing   Problem: Clinical Measurements: Goal: Ability to maintain clinical measurements within normal limits will improve Outcome: Progressing   Problem: Nutrition: Goal: Adequate nutrition will be maintained Outcome: Progressing   Problem: Pain Managment: Goal: General experience of comfort will improve and/or be controlled Outcome: Progressing   Problem: Safety: Goal: Ability to remain free from injury will improve Outcome: Progressing   Problem: Skin Integrity: Goal: Risk for impaired skin integrity will decrease Outcome: Progressing

## 2023-07-06 NOTE — TOC Progression Note (Signed)
 Transition of Care [facility]) - Initial/Assessment Note    Patient Details  Name: Ann Powell MRN: [id number] Date of Birth: July 10, 1940  Transition of Care [facility]) CM/SW Contact:    Ralene Bathe, LCSW Phone Number: 07/06/2023, 11:15 AM  Clinical Narrative:                 Insurance authorization pending.  Reference number [id number].  TOC will continue to follow.    Expected Discharge Plan: Skilled Nursing Facility Barriers to Discharge: Continued Medical Work up, SNF Pending bed offer   Patient Goals and CMS Choice Patient states their goals for this hospitalization and ongoing recovery are:: SNF          Expected Discharge Plan and Services In-house Referral: Clinical Social Work   Post Acute Care Choice: Skilled Nursing Facility Living arrangements for the past 2 months: Single Family Home                                      Prior Living Arrangements/Services Living arrangements for the past 2 months: Single Family Home Lives with:: Relatives Patient language and need for interpreter reviewed:: Yes Do you feel safe going back to the place where you live?: Yes            Criminal Activity/Legal Involvement Pertinent to Current Situation/Hospitalization: No - Comment as needed  Activities of Daily Living   ADL Screening (condition at time of admission) Independently performs ADLs?: Yes (appropriate for developmental age) Is the patient deaf or have difficulty hearing?: No Does the patient have difficulty seeing, even when wearing glasses/contacts?: No Does the patient have difficulty concentrating, remembering, or making decisions?: No  Permission Sought/Granted Permission sought to share information with : Family Supports Permission granted to share information with : No (Contact information on chart)  Share Information with NAME: Tamrah Victorino  Permission granted to share info w AGENCY: SNF  Permission granted to share info w Relationship:  Niece  Permission granted to share info w Contact Information: [phone]  Emotional Assessment Appearance:: Appears stated age Attitude/Demeanor/Rapport: Engaged Affect (typically observed): Accepting, Appropriate, Adaptable, Calm, Stable, Pleasant Orientation: : Oriented to Self, Oriented to Place, Oriented to Situation, Oriented to  Time Alcohol / Substance Use: Not Applicable Psych Involvement: No (comment)  Admission diagnosis:  Acute respiratory failure (HCC) [J96.00] Acute respiratory failure with hypoxia and hypercapnia (HCC) [J96.01, J96.02] Patient Active Problem List   Diagnosis Date Noted   Acute respiratory failure (HCC) 06/27/2023   NSTEMI (non-ST elevated myocardial infarction) (HCC) 06/27/2023   Acute systolic heart failure (HCC) 06/27/2023   PVD (peripheral vascular disease) (HCC)    Hypertension 01/29/2017   Edema of right lower extremity 09/25/2016   Gangrenous (HCC) 09/25/2016   Pain in extremity 09/25/2016   PAD (peripheral artery disease) (HCC) 09/25/2016   Critical lower limb ischemia (HCC) 08/22/2016   PCP:  Donita Brooks, MD Pharmacy:   [facility] [phone] [place] Otto, [place] - [address] [place] [address] Phone: [phone] Fax: [phone]  [facility] - [place], [place] - [address] [place] [address] Phone: [phone] Fax: [phone]     Social Drivers of Health (SDOH) Social History: SDOH Screenings   Food Insecurity: No Food Insecurity (06/29/2023)  Housing: Low Risk  (06/29/2023)  Transportation Needs: No Transportation Needs (06/29/2023)  Utilities:  Not At Risk (06/29/2023)  Alcohol Screen: Low Risk  (01/16/2023)  Depression (PHQ2-9): Low Risk  (03/04/2023)  Financial Resource Strain: Low Risk  (01/16/2023)  Physical Activity: Insufficiently Active (01/16/2023)  Social Connections: Moderately Isolated (06/29/2023)  Stress:  No Stress Concern Present (01/16/2023)  Tobacco Use: Low Risk  (06/27/2023)  Health Literacy: Adequate Health Literacy (01/16/2023)   SDOH Interventions: Transportation Interventions: Inpatient TOC, Intervention Not Indicated, Patient Resources (Friends/Family)   Readmission Risk Interventions    06/30/2023    2:48 PM  Readmission Risk Prevention Plan  Post Dischage Appt Complete  Medication Screening Complete  Transportation Screening Complete

## 2023-07-06 NOTE — Plan of Care (Signed)
  Problem: Education: Goal: Ability to describe self-care measures that may prevent or decrease complications (Diabetes Survival Skills Education) will improve Outcome: Progressing Goal: Individualized Educational Video(s) Outcome: Progressing   Problem: Coping: Goal: Ability to adjust to condition or change in health will improve Outcome: Progressing   Problem: Fluid Volume: Goal: Ability to maintain a balanced intake and output will improve Outcome: Progressing   Problem: Health Behavior/Discharge Planning: Goal: Ability to identify and utilize available resources and services will improve Outcome: Progressing Goal: Ability to manage health-related needs will improve Outcome: Progressing   Problem: Metabolic: Goal: Ability to maintain appropriate glucose levels will improve Outcome: Progressing   Problem: Nutritional: Goal: Maintenance of adequate nutrition will improve Outcome: Progressing Goal: Progress toward achieving an optimal weight will improve Outcome: Progressing   Problem: Skin Integrity: Goal: Risk for impaired skin integrity will decrease Outcome: Progressing   Problem: Tissue Perfusion: Goal: Adequacy of tissue perfusion will improve Outcome: Progressing   Problem: Education: Goal: Knowledge of General Education information will improve Description: Including pain rating scale, medication(s)/side effects and non-pharmacologic comfort measures Outcome: Progressing   Problem: Health Behavior/Discharge Planning: Goal: Ability to manage health-related needs will improve Outcome: Progressing   Problem: Clinical Measurements: Goal: Ability to maintain clinical measurements within normal limits will improve Outcome: Progressing   Problem: Activity: Goal: Risk for activity intolerance will decrease Outcome: Progressing   Problem: Nutrition: Goal: Adequate nutrition will be maintained Outcome: Progressing   Problem: Coping: Goal: Level of anxiety  will decrease Outcome: Progressing   Problem: Elimination: Goal: Will not experience complications related to bowel motility Outcome: Progressing Goal: Will not experience complications related to urinary retention Outcome: Progressing   Problem: Pain Managment: Goal: General experience of comfort will improve and/or be controlled Outcome: Progressing   Problem: Safety: Goal: Ability to remain free from injury will improve Outcome: Progressing

## 2023-07-07 DIAGNOSIS — I214 Non-ST elevation (NSTEMI) myocardial infarction: Secondary | ICD-10-CM | POA: Diagnosis not present

## 2023-07-07 DIAGNOSIS — M4050 Lordosis, unspecified, site unspecified: Secondary | ICD-10-CM | POA: Diagnosis not present

## 2023-07-07 DIAGNOSIS — Z79899 Other long term (current) drug therapy: Secondary | ICD-10-CM | POA: Diagnosis not present

## 2023-07-07 DIAGNOSIS — D649 Anemia, unspecified: Secondary | ICD-10-CM | POA: Diagnosis not present

## 2023-07-07 DIAGNOSIS — R112 Nausea with vomiting, unspecified: Secondary | ICD-10-CM | POA: Diagnosis not present

## 2023-07-07 DIAGNOSIS — L89153 Pressure ulcer of sacral region, stage 3: Secondary | ICD-10-CM | POA: Diagnosis not present

## 2023-07-07 DIAGNOSIS — Z741 Need for assistance with personal care: Secondary | ICD-10-CM | POA: Diagnosis not present

## 2023-07-07 DIAGNOSIS — E0842 Diabetes mellitus due to underlying condition with diabetic polyneuropathy: Secondary | ICD-10-CM | POA: Diagnosis not present

## 2023-07-07 DIAGNOSIS — Z7401 Bed confinement status: Secondary | ICD-10-CM | POA: Diagnosis not present

## 2023-07-07 DIAGNOSIS — L98429 Non-pressure chronic ulcer of back with unspecified severity: Secondary | ICD-10-CM | POA: Diagnosis not present

## 2023-07-07 DIAGNOSIS — Z515 Encounter for palliative care: Secondary | ICD-10-CM | POA: Diagnosis not present

## 2023-07-07 DIAGNOSIS — I480 Paroxysmal atrial fibrillation: Secondary | ICD-10-CM

## 2023-07-07 DIAGNOSIS — J811 Chronic pulmonary edema: Secondary | ICD-10-CM | POA: Diagnosis not present

## 2023-07-07 DIAGNOSIS — R2689 Other abnormalities of gait and mobility: Secondary | ICD-10-CM | POA: Diagnosis not present

## 2023-07-07 DIAGNOSIS — J9690 Respiratory failure, unspecified, unspecified whether with hypoxia or hypercapnia: Secondary | ICD-10-CM | POA: Diagnosis not present

## 2023-07-07 DIAGNOSIS — R5381 Other malaise: Secondary | ICD-10-CM | POA: Diagnosis not present

## 2023-07-07 DIAGNOSIS — E559 Vitamin D deficiency, unspecified: Secondary | ICD-10-CM | POA: Diagnosis not present

## 2023-07-07 DIAGNOSIS — S91301A Unspecified open wound, right foot, initial encounter: Secondary | ICD-10-CM | POA: Diagnosis not present

## 2023-07-07 DIAGNOSIS — J9601 Acute respiratory failure with hypoxia: Secondary | ICD-10-CM | POA: Diagnosis not present

## 2023-07-07 DIAGNOSIS — E785 Hyperlipidemia, unspecified: Secondary | ICD-10-CM | POA: Diagnosis not present

## 2023-07-07 DIAGNOSIS — W19XXXA Unspecified fall, initial encounter: Secondary | ICD-10-CM | POA: Diagnosis not present

## 2023-07-07 DIAGNOSIS — M6281 Muscle weakness (generalized): Secondary | ICD-10-CM | POA: Diagnosis not present

## 2023-07-07 DIAGNOSIS — Z7409 Other reduced mobility: Secondary | ICD-10-CM | POA: Diagnosis not present

## 2023-07-07 DIAGNOSIS — R12 Heartburn: Secondary | ICD-10-CM | POA: Diagnosis not present

## 2023-07-07 DIAGNOSIS — I10 Essential (primary) hypertension: Secondary | ICD-10-CM | POA: Diagnosis not present

## 2023-07-07 DIAGNOSIS — E612 Magnesium deficiency: Secondary | ICD-10-CM | POA: Diagnosis not present

## 2023-07-07 DIAGNOSIS — E875 Hyperkalemia: Secondary | ICD-10-CM | POA: Diagnosis not present

## 2023-07-07 DIAGNOSIS — I4891 Unspecified atrial fibrillation: Secondary | ICD-10-CM | POA: Diagnosis not present

## 2023-07-07 DIAGNOSIS — I5021 Acute systolic (congestive) heart failure: Secondary | ICD-10-CM | POA: Diagnosis not present

## 2023-07-07 DIAGNOSIS — N183 Chronic kidney disease, stage 3 unspecified: Secondary | ICD-10-CM | POA: Diagnosis not present

## 2023-07-07 DIAGNOSIS — R570 Cardiogenic shock: Secondary | ICD-10-CM | POA: Diagnosis not present

## 2023-07-07 DIAGNOSIS — R52 Pain, unspecified: Secondary | ICD-10-CM | POA: Diagnosis not present

## 2023-07-07 DIAGNOSIS — K30 Functional dyspepsia: Secondary | ICD-10-CM | POA: Diagnosis not present

## 2023-07-07 DIAGNOSIS — J9600 Acute respiratory failure, unspecified whether with hypoxia or hypercapnia: Secondary | ICD-10-CM | POA: Diagnosis not present

## 2023-07-07 DIAGNOSIS — J810 Acute pulmonary edema: Secondary | ICD-10-CM | POA: Diagnosis not present

## 2023-07-07 LAB — URINALYSIS, ROUTINE W REFLEX MICROSCOPIC
Bilirubin Urine: NEGATIVE
Glucose, UA: >=500 mg/dL — AB
Hgb urine dipstick: NEGATIVE
Ketones, ur: NEGATIVE mg/dL
Leukocytes,Ua: NEGATIVE
Nitrite: NEGATIVE
Protein, ur: NEGATIVE mg/dL
Specific Gravity, Urine: 1.007 (ref 1.005–1.030)
pH: 7 (ref 5.0–8.0)

## 2023-07-07 LAB — GLUCOSE, CAPILLARY
Glucose-Capillary: 81 mg/dL (ref 70–99)
Glucose-Capillary: 106 mg/dL — ABNORMAL HIGH (ref 70–99)
Glucose-Capillary: 83 mg/dL (ref 70–99)

## 2023-07-07 MED ORDER — SPIRONOLACTONE 25 MG PO TABS: 12.5000 mg | ORAL_TABLET | Freq: Every day | ORAL | Status: AC

## 2023-07-07 MED ORDER — FUROSEMIDE 40 MG PO TABS: 40.0000 mg | ORAL_TABLET | Freq: Every day | ORAL | Status: AC

## 2023-07-07 MED ORDER — CLOPIDOGREL BISULFATE 75 MG PO TABS: 75.0000 mg | ORAL_TABLET | Freq: Every day | ORAL | Status: AC

## 2023-07-07 MED ORDER — METOPROLOL SUCCINATE ER 25 MG PO TB24: 12.5000 mg | ORAL_TABLET | Freq: Every day | ORAL | Status: AC

## 2023-07-07 MED ORDER — AMIODARONE HCL 200 MG PO TABS: 200.0000 mg | ORAL_TABLET | Freq: Every day | ORAL | Status: DC

## 2023-07-07 MED ORDER — NITROGLYCERIN 0.4 MG SL SUBL: 0.4000 mg | SUBLINGUAL_TABLET | SUBLINGUAL | Status: AC | PRN

## 2023-07-07 MED ORDER — ALUM & MAG HYDROXIDE-SIMETH 200-200-20 MG/5ML PO SUSP: 30.0000 mL | Freq: Four times a day (QID) | ORAL | Status: AC | PRN

## 2023-07-07 NOTE — Progress Notes (Signed)
 Mobility Specialist Progress Note;   07/07/23 0915  Mobility  Activity Ambulated with assistance in hallway  Level of Assistance Standby assist, set-up cues, supervision of patient - no hands on  Assistive Device Front wheel walker  Distance Ambulated (ft) 100 ft  Activity Response Tolerated well  Mobility Referral Yes  Mobility visit 1 Mobility  Mobility Specialist Start Time (ACUTE ONLY) 0915  Mobility Specialist Stop Time (ACUTE ONLY) [id number]  Mobility Specialist Time Calculation (min) (ACUTE ONLY) 9 min   Pt up returning back from BR upon arrival, eager to ambulate. Required only SV for safety while ambulating. VSS throughout. VC required for RW proximity while ambulating and for safety. Pt returned back to chair with all needs met, alarm on.   Caesar Bookman Mobility Specialist Please contact via SecureChat or Rehab Office [phone] '

## 2023-07-07 NOTE — Plan of Care (Signed)
  Problem: Health Behavior/Discharge Planning: Goal: Ability to manage health-related needs will improve Outcome: Progressing   Problem: Metabolic: Goal: Ability to maintain appropriate glucose levels will improve Outcome: Progressing   Problem: Nutritional: Goal: Maintenance of adequate nutrition will improve Outcome: Progressing   Problem: Tissue Perfusion: Goal: Adequacy of tissue perfusion will improve Outcome: Progressing   Problem: Education: Goal: Knowledge of General Education information will improve Description: Including pain rating scale, medication(s)/side effects and non-pharmacologic comfort measures Outcome: Progressing   Problem: Clinical Measurements: Goal: Ability to maintain clinical measurements within normal limits will improve Outcome: Progressing   Problem: Activity: Goal: Risk for activity intolerance will decrease Outcome: Progressing   Problem: Nutrition: Goal: Adequate nutrition will be maintained Outcome: Progressing   Problem: Coping: Goal: Level of anxiety will decrease Outcome: Progressing   Problem: Pain Managment: Goal: General experience of comfort will improve and/or be controlled Outcome: Progressing

## 2023-07-07 NOTE — Progress Notes (Signed)
 Patient Name: Ann Powell Date of Encounter: 07/07/2023 [facility] Cardiologist: None   Interval Summary  .    Feeling well.  No CP/SOB.  Ambulated without difficulty.   Vital Signs .    Vitals:   07/07/23 0313 07/07/23 0724 07/07/23 0804 07/07/23 0938  BP: 119/62 (!) 109/55  115/64  Pulse: 75 74  80  Resp: 17 17    Temp: 98.2 F (36.8 C) 98.1 F (36.7 C)    TempSrc: Oral Oral    SpO2: 100% 92% 100%   Weight: 47.8 kg     Height:        Intake/Output Summary (Last 24 hours) at 07/07/2023 1050 Last data filed at 07/07/2023 0900 Gross per 24 hour  Intake 1072.78 ml  Output 2700 ml  Net -1627.22 ml      07/07/2023    3:13 AM 07/06/2023    6:11 AM 07/05/2023    5:38 AM  Last 3 Weights  Weight (lbs) 105 lb 6.1 oz 103 lb 13.4 oz 103 lb 13.4 oz  Weight (kg) 47.8 kg 47.1 kg 47.1 kg      Telemetry/ECG    Sinus rhythm.  No events.  - Personally Reviewed  Echo 06/27/23: IMPRESSIONS     1. The left ventricle demonstrates global and regional wall motion  abnormalities.. Left ventricular ejection fraction, by estimation, is 35  to 40%. The left ventricle has moderately decreased function. The left  ventricle demonstrates global hypokinesis.   There is mild left ventricular hypertrophy of the basal-septal segment.  Left ventricular diastolic function could not be evaluated.   2. Right ventricular systolic function is normal. The right ventricular  size is normal.   3. A small pericardial effusion is present. The pericardial effusion is  localized near the right atrium.   4. The mitral valve is normal in structure. Moderate mitral valve  regurgitation. No evidence of mitral stenosis.   5. Tricuspid valve regurgitation is mild to moderate.   6. The aortic valve is grossly normal. Aortic valve regurgitation is  mild. Aortic valve sclerosis is present, with no evidence of aortic valve  stenosis.   7. Aortic Ascending aorta not well visualized.   Physical Exam  .    VS:  BP 115/64   Pulse 80   Temp 98.1 F (36.7 C) (Oral)   Resp 17   Ht 5\' 2"  (1.575 m)   Wt 47.8 kg   SpO2 100%   BMI 19.27 kg/m  , BMI Body mass index is 19.27 kg/m. GENERAL:  Well appearing.  Frail. HEENT: Pupils equal round and reactive, fundi not visualized, oral mucosa unremarkable NECK:  No jugular venous distention, waveform within normal limits, carotid upstroke brisk and symmetric, no bruits, no thyromegaly LUNGS:  Clear to auscultation bilaterally HEART:  RRR.  PMI not displaced or sustained,S1 and S2 within normal limits, no S3, no S4, no clicks, no rubs, no murmurs ABD:  Flat, positive bowel sounds normal in frequency in pitch, no bruits, no rebound, no guarding, no midline pulsatile mass, no hepatomegaly, no splenomegaly EXT:  2 plus pulses throughout, no edema, no cyanosis no clubbing MSK: Severe kyphosis. SKIN:  No rashes no nodules NEURO:  Cranial nerves II through XII grossly intact, motor grossly intact throughout PSYCH:  Cognitively intact, oriented to person place and time   Assessment & Plan .     37F with PAD, Chronic anemia, and hyperlipidemia admitted with hypoxic respiratory failure.  She was found to  have acute systolic heart failure with LVEF 35-40%, aortic atherosclerosis, and type II NSTEMI.  # Acute systolic diastolic heart failure: LVEF 35-40%.  New this admission.  Global hypokinesis noted on echo.  RV function was normal.  There was also small pericardial effusion.  Limited echo pending now that she is no longer in respiratory distress. She is euvolemic on exam.  Continue metoprolol, spironolactone, and Jardiance has been discontinued due to hypoglycemia.  Will need to watch potassium.  She may not needed now that she is on spironolactone.  Blood pressure too low for ARB.  Consider adding as an outpatient if tolerated.  # NSTEMI: High-sensitivity troponin increased to 1100.  Extensive coronary atherosclerosis noted on CT.  LVEF 40% on echo.   This occurred in the setting of multifocal pneumonia versus cardiogenic pulmonary edema.  Patient is a Scientist, product/process development.  She has chronic anemia at baseline.  Dr. Anne Fu had a lengthy conversation with her and they decided on no invasive evaluation at this time.  Consider cardiac catheterization as an outpatient.  She needs aggressive medical management.  Planning for aspirin and clopidogrel as well as rosuvastatin.  Recommend LDL goal less than 55.  Aortic atherosclerosis: PAD: Hyperlipidemia: History of critical limb ischemia and gangrene.  Occluded left SFA, popliteal, and tibial arteries.  There were no revascularization options available when she underwent angiography in 2018.  # Atrial fibrillation: New onset atrial fibrillation this admission.  Not on anticoagulation due to chronic anemia and being a Jehovah's Witness.  She is now on aspirin echocardiogram.  Now maintaining sinus rhythm on amiodarone.  Plan to continue this short-term.  Would recommend considering a watchman as an outpatient.  Given her underlying heart failure and multiple comorbidities she is at high risk for recurrent atrial fibrillation.  Continue amiodarone.  [facility] will sign off.   Medication Recommendations:  Add ARB as possible as an outpatient.  Other recommendations (labs, testing, etc): BMP as outpatient.   Follow up as an outpatient: Cardiology follow-up arranged.    For questions or updates, please contact [facility] Please consult [email or web address] for contact info under        Signed, Chilton Si, MD  They decided on

## 2023-07-07 NOTE — Progress Notes (Addendum)
 RN called report to [employer], [place]. All questions answered. Waiting [facility] arrival. Pt belongings sent with patient. IV removed. Pt being transported by [facility] for d/c to [facility].

## 2023-07-07 NOTE — Progress Notes (Signed)
 Physical Therapy Treatment Patient Details Name: Ann Powell MRN: [id number] DOB: 1940/07/04 Today's Date: 07/07/2023   History of Present Illness Pt is 83 year old presented to [facility] on  06/27/23 for respiratory failure, NSTEMI, and pulmonary edema. Intubated in ED. Extubated 06/29/23. PMH - PAD, HTN    PT Comments  Pt up in chair on arrival and eager for mobility with continued progress towards acute goals. Pt demonstrating transfers and gait with grossly CGA for safety and benefits from RW support as pt requiring min A to maintain balance for in room ambulation without UE support. Pt able to complete standing peri-care statically without UE support at supervision level. Pt was educated on continued walker use to maximize functional independence, safety, and decrease risk for falls. Pt continues to benefit from skilled PT services to progress toward functional mobility goals.      If plan is discharge home, recommend the following: Assistance with cooking/housework;Help with stairs or ramp for entrance;Assist for transportation;A little help with bathing/dressing/bathroom;A little help with walking and/or transfers   Can travel by private vehicle     Yes  Equipment Recommendations  None recommended by PT (pt has cane and RW but does not typically use these in her home)    Recommendations for Other Services       Precautions / Restrictions Precautions Precautions: Fall Recall of Precautions/Restrictions: Intact Precaution/Restrictions Comments: monitor O2 (difficult signal) Restrictions Weight Bearing Restrictions Per Provider Order: No     Mobility  Bed Mobility Overal bed mobility: Needs Assistance             General bed mobility comments: pt up in chair on arrival    Transfers Overall transfer level: Needs assistance Equipment used: None Transfers: Sit to/from Stand Sit to Stand: Contact guard assist           General transfer comment: CGA for safety, able  to rise without UE support    Ambulation/Gait Ambulation/Gait assistance: Min assist, Contact guard assist Gait Distance (Feet): 125 Feet Assistive device: Rolling walker (2 wheels), None Gait Pattern/deviations: Step-through pattern, Decreased stride length, Trunk flexed, Drifts right/left, Wide base of support, Leaning posteriorly Gait velocity: decr     General Gait Details: min A to steady without UE support in room, no LOB with RW support for hallway gait   Stairs             Wheelchair Mobility     Tilt Bed    Modified Rankin (Stroke Patients Only)       Balance Overall balance assessment: Needs assistance Sitting-balance support: No upper extremity supported, Feet supported Sitting balance-Leahy Scale: Good     Standing balance support: No upper extremity supported, During functional activity, Reliant on assistive device for balance Standing balance-Leahy Scale: Poor Standing balance comment: benefits from UE support during dynamic tasks, able to complete peri-care without UE support with supervsion                            Communication Communication Communication: Impaired Factors Affecting Communication: Hearing impaired  Cognition Arousal: Alert Behavior During Therapy: WFL for tasks assessed/performed   PT - Cognitive impairments: Safety/Judgement                       PT - Cognition Comments: decreased awareness of deficits/safety; very motivated to progress Following commands: Intact      Cueing Cueing Techniques: Verbal cues  Exercises  General Comments        Pertinent Vitals/Pain Pain Assessment Pain Assessment: No/denies pain    Home Living                          Prior Function            PT Goals (current goals can now be found in the care plan section) Acute Rehab PT Goals Patient Stated Goal: to get stronger before I go home PT Goal Formulation: With patient/family Time For Goal  Achievement: 07/14/23 Progress towards PT goals: Progressing toward goals    Frequency    Min 2X/week      PT Plan      Co-evaluation              AM-PAC PT "6 Clicks" Mobility   Outcome Measure  Help needed turning from your back to your side while in a flat bed without using bedrails?: None Help needed moving from lying on your back to sitting on the side of a flat bed without using bedrails?: A Little Help needed moving to and from a bed to a chair (including a wheelchair)?: A Little Help needed standing up from a chair using your arms (e.g., wheelchair or bedside chair)?: A Little Help needed to walk in hospital room?: A Little Help needed climbing 3-5 steps with a railing? : A Lot 6 Click Score: 18    End of Session   Activity Tolerance: Patient tolerated treatment well Patient left: in chair;with call bell/phone within reach Nurse Communication: Mobility status PT Visit Diagnosis: Other abnormalities of gait and mobility (R26.89);Muscle weakness (generalized) (M62.81);History of falling (Z91.81)     Time: 2130-8657 PT Time Calculation (min) (ACUTE ONLY): 14 min  Charges:    $Gait Training: 8-22 mins PT General Charges $$ ACUTE PT VISIT: 1 Visit                     Sharra Cayabyab R. PTA Acute Rehabilitation Services Office: [phone]   Catalina Antigua 07/07/2023, 12:54 PM

## 2023-07-07 NOTE — Progress Notes (Signed)
 Heart Failure Navigator Progress Note  Assessed for Heart & Vascular TOC clinic readiness.  Patient does not meet criteria due to [facility] scheduled appointment 08/08/23, No HF TOC.   Navigator will sign off at this time.   Rhae Hammock, BSN, Scientist, clinical (histocompatibility and immunogenetics) Only

## 2023-07-07 NOTE — Discharge Summary (Signed)
 Physician Discharge Summary   Patient: Ann Powell MRN: [id number] DOB: 1940/08/07  Admit date:     06/27/2023  Discharge date: 07/07/23  Discharge Physician: Deanna Artis   PCP: Donita Brooks, MD   Recommendations at discharge:   At this time patient will be discharged to SNF.  If you experience any symptoms such as fever, vomiting, shortness of breath, chest pain, abdominal pain, or other concerning symptoms, please call your primary care provider or go to the emergency department immediately.  Discharge Diagnoses: Principal Problem:   Acute respiratory failure (HCC) Active Problems:   NSTEMI (non-ST elevated myocardial infarction) (HCC)   Acute systolic heart failure (HCC)   PAF (paroxysmal atrial fibrillation) (HCC)  Resolved Problems:   * No resolved hospital problems. [facility] Course: 83 year old with history of peripheral artery disease, hypertension, hyperlipidemia presented to the emergency room on 3/28 with sudden onset of diaphoresis and shortness of breath while walking.  Lives at home with her niece.  Family has reported gradual activity intolerance since last 2 months.  In the emergency room she was in respiratory distress.  Oxygen saturation 70%.  Did not respond to nonrebreather vomited and she got more lethargic so she had to be intubated.  On presentation, chest x-ray with diffuse pulmonary edema.  VBG with respiratory acidosis.  EKG with diffuse ST depressions. 3/28, admitted with respiratory distress intubated and treated for pulmonary edema and pneumonia. 3/30, extubated to nasal cannula oxygen. Patient developed atrial tachycardia on 3/30 suspected A-fib and treated with amiodarone bolus and drip.  Slightly elevated troponins. 4/3, out of ICU to medical floor on minimum oxygen. /4, clinically improving.  On room air now.  Assessment and Plan:   Acute hypoxemic respiratory failure secondary to pulmonary edema -Initially requiring intubation,  subsequently improved, now weaned down to room air.  Continue chest PT and incentive spirometry.   NSTEMI/acute HFrEF with cardiogenic shock -EF 35 - 40% with regional wall motion abnormality.  Followed closely by cardiology.  Initially treated with heparin drip, IV Lasix.  Transition to p.o. DAPT, Lasix, Toprol-XL.  Currently deferring ischemic evaluation.  Pending repeat limited echo.  Currently on 2/4 GDMT (beta-blocker/spironolactone ).  Jardiance discontinued secondary to hypoglycemia.  Will give prescription for cardiac regimen below.  Patient to follow closely with cardiology in the outpatient setting (already scheduled Per cardiology).   Suspected pneumonia -This was likely pulmonary edema.  Cultures negative.  RVP panel negative.  Completed 5 days of Rocephin therapy.  Ruled out.   A-fib with RVR, atrial tachycardia -Developed during acute illnesses.  Patient was treated with amiodarone infusion and now on oral amiodarone taper along with beta-blockers.  Cardiology following.  Not on anticoagulation because of chronic anemia (Jehovah's Witness), already being on DAPT. Plan is to load amiodarone and discharged on maintenance amiodarone with outpatient follow-up.   Anemia of chronic disease -Hemoglobin is stable.  Jehovah's Witness.  No for any blood transfusions.   Physical debilitation and muscle weakness -Working with PT/OT.  Patient discharged to SNF today.       Consultants: Cardiology, advanced heart failure Procedures performed: None Disposition: Skilled nursing facility Diet recommendation:  Discharge Diet Orders (From admission, onward)     Start     Ordered   07/07/23 0000  Diet - low sodium heart healthy        07/07/23 1204           Cardiac diet DISCHARGE MEDICATION: Allergies as of 07/07/2023   No  Known Allergies      Medication List     STOP taking these medications    losartan 50 MG tablet Commonly known as: COZAAR       TAKE these  medications    alum & mag hydroxide-simeth 200-200-20 MG/5ML suspension Commonly known as: MAALOX/MYLANTA Take 30 mLs by mouth every 6 (six) hours as needed for indigestion or heartburn.   amiodarone 200 MG tablet Commonly known as: PACERONE Take 1 tablet (200 mg total) by mouth daily. Start taking on: July 08, 2023   aspirin 325 MG tablet Take 325 mg by mouth daily.   clopidogrel 75 MG tablet Commonly known as: PLAVIX Take 1 tablet (75 mg total) by mouth daily. Start taking on: July 08, 2023   COQ10 PO Take 1 capsule by mouth daily.   furosemide 40 MG tablet Commonly known as: LASIX Take 1 tablet (40 mg total) by mouth daily. Start taking on: July 08, 2023   IRON PO Take 1 tablet by mouth daily.   MAGNESIUM PO Take 1 tablet by mouth daily.   metoprolol succinate 25 MG 24 hr tablet Commonly known as: TOPROL-XL Take 0.5 tablets (12.5 mg total) by mouth daily. Start taking on: July 08, 2023   nitroGLYCERIN 0.4 MG SL tablet Commonly known as: NITROSTAT Place 1 tablet (0.4 mg total) under the tongue every 5 (five) minutes as needed for chest pain.   rosuvastatin 20 MG tablet Commonly known as: CRESTOR TAKE 1 TABLET EVERY DAY   spironolactone 25 MG tablet Commonly known as: ALDACTONE Take 0.5 tablets (12.5 mg total) by mouth daily. Start taking on: July 08, 2023   VITAMIN B-12 PO Take 1 tablet by mouth daily.   VITAMIN D-3 PO Take 1 capsule by mouth daily.               Discharge Care Instructions  (From admission, onward)           Start     Ordered   07/07/23 0000  Discharge wound care:       Comments: Wound care  Daily      Comments: Clean 4th toe R foot with NS, apply a piece of silver hydrofiber ([employer]qu[employer] Hayden Pedro #[id number]) to dry and separate area.  May secure with silicone foam or dry gauze and tape whichever is preferred.   07/07/23 1204            Follow-up Information     [facility], [facility] Follow up.   Specialty: Home  Health Services Why: Someone will call you to schedule first home visit. Contact information: [address] [place] [address] [phone]         Reather Littler D, NP Follow up.   Specialty: Cardiology Why: Hospital follow-up with Cardiology scheduled for 08/08/2023 at 10:55am. Please arrive 15 minutes early for check-in. If this date/ time does not work for you, please call our office to reschedule.  We are currently in the process of transitioning from two locations to one.  Effective July 28, 2023, all appointments that were previously scheduled at either our [facility] or [place] locations will be moved to our new location at [address], [place], [place], [address].  The phone number for our new location will be [phone]. Contact information: [address] [place] [address] [phone]         [facility] Follow up.   Specialty: Cardiology Why: Repeat Echocardiogram scheduled for  08/05/2023 at 2pm. Please arrive 15 minutes early for check-in. If this date/ time does not work for you, please call our office to reschedule.  We are currently in the process of transitioning from two locations to one.  Effective July 28, 2023, all appointments that were previously scheduled at either our [facility] or [place] locations will be moved to our new location at [address] [place], [place], [address].  The phone number for our new location will be [phone]. Contact information: [address] [place] [address] [phone]               Discharge Exam: Ceasar Mons Weights   07/05/23 0102 07/06/23 7253 07/07/23 0313  Weight: 47.1 kg 47.1 kg 47.8 kg   GENERAL:  Alert, pleasant, no acute distress, frail HEENT:  EOMI CARDIOVASCULAR:  RRR, no murmurs appreciated RESPIRATORY:  Clear to auscultation, no wheezing, rales, or rhonchi GASTROINTESTINAL:  Soft, nontender,  nondistended EXTREMITIES:  No LE edema bilaterally NEURO:  No new focal deficits appreciated SKIN:  No rashes noted PSYCH:  Appropriate mood and affect   Condition at discharge: improving  The results of significant diagnostics from this hospitalization (including imaging, microbiology, ancillary and laboratory) are listed below for reference.   Imaging Studies: DG CHEST PORT 1 VIEW Result Date: 06/30/2023 CLINICAL DATA:  Hypoxemia EXAM: PORTABLE CHEST 1 VIEW COMPARISON:  06/30/2023 FINDINGS: Hazy increased bilateral airspace opacities are diffuse but with a perihilar predominance favoring acute pulmonary edema edema over atypical pneumonia. Continued mild obscuration of the right hemidiaphragm potentially from atelectasis and/or pleural effusion. The patient is rotated to the right on today's radiograph, reducing diagnostic sensitivity and specificity. Atherosclerotic calcification of the aortic arch. Mild enlargement of the cardiopericardial silhouette. Thoracic spondylosis. IMPRESSION: 1. Hazy increased bilateral airspace opacities are diffuse but with a perihilar predominance favoring acute pulmonary edema over atypical pneumonia. 2. Continued mild obscuration of the right hemidiaphragm potentially from atelectasis and/or pleural effusion. 3. Mild enlargement of the cardiopericardial silhouette. 4. Thoracic spondylosis. Electronically Signed   By: Gaylyn Rong M.D.   On: 06/30/2023 20:47   DG Chest Port 1 View Result Date: 06/30/2023 CLINICAL DATA:  Respiratory failure. EXAM: PORTABLE CHEST 1 VIEW COMPARISON:  06/29/2023 FINDINGS: Interval extubation and NG tube removal. Similar bibasilar atelectasis or infiltrate with small bilateral pleural effusions. The cardio pericardial silhouette is enlarged. No acute bony abnormality. Telemetry leads overlie the chest. IMPRESSION: Interval extubation. Similar bibasilar atelectasis or infiltrate with small bilateral pleural effusions. Electronically  Signed   By: Kennith Center M.D.   On: 06/30/2023 08:52   DG Chest Port 1 View Result Date: 06/29/2023 CLINICAL DATA:  [id number] Respiratory failure [facility]) [phone] EXAM: PORTABLE CHEST 1 VIEW COMPARISON:  06/28/2023 FINDINGS: Limited examination secondary to patient kyphosis and patient's chin obscuring the left lung apex. These factors degrade evaluation of endotracheal tube positioning, suspected near the level of the thoracic inlet. Enteric tube courses below the diaphragm with distal tip beyond the inferior margin of the film. Patchy bilateral airspace opacities and small pleural effusions, enlarging on the right. No right-sided pneumothorax. Left lung apex obscured. IMPRESSION: 1. Limited examination secondary to patient kyphosis and patient's chin obscuring the left lung apex. These factors degrade evaluation of endotracheal tube positioning, suspected near the level of the thoracic inlet. 2. Patchy bilateral airspace opacities and small pleural effusions, enlarging on the right. Electronically Signed   By: Duanne Guess D.O.   On: 06/29/2023 12:21   DG Chest [facility]  1 View Result Date: 06/28/2023 CLINICAL DATA:  Pulmonary edema. EXAM: PORTABLE CHEST 1 VIEW COMPARISON:  06/27/2023 FINDINGS: Significantly limited exam due to artifact as the patient's skull and facial bones overlie both upper lobes and the upper mediastinum. Cannot access ET tube placement. And tear tube tip courses below the level of the GE junction. Persistent bilateral pulmonary opacities which appear mildly improved in the interval. IMPRESSION: 1. Significantly limited exam due to artifact from patient's head overlying the chest. 2. Persistent bilateral pulmonary opacities which appear mildly improved in the interval. Electronically Signed   By: Signa Kell M.D.   On: 06/28/2023 06:25   ECHOCARDIOGRAM COMPLETE Result Date: 06/27/2023    ECHOCARDIOGRAM REPORT   Patient Name:   Nicole Cella MAY Argueta Date of Exam: 06/27/2023 Medical Rec #:   [id number]          Height:       62.0 in Accession #:    [id number]         Weight:       119.0 lb Date of Birth:  08/17/1940          BSA:          1.533 m Patient Age:    82 years           BP:           133/79 mmHg Patient Gender: F                  HR:           100 bpm. Exam Location:  Inpatient Procedure: 2D Echo (Both Spectral and Color Flow Doppler were utilized during            procedure). STAT ECHO Indications:    acute respiratory distress  History:        Patient has no prior history of Echocardiogram examinations. PAD                 and PVD, Signs/Symptoms:Dyspnea; Risk Factors:Hypertension.  Sonographer:    Delcie Roch RDCS Referring Phys: [id number] PETER E BABCOCK  Sonographer Comments: Echo performed with patient supine and on artificial respirator. IMPRESSIONS  1. The left ventricle demonstrates global and regional wall motion abnormalities.. Left ventricular ejection fraction, by estimation, is 35 to 40%. The left ventricle has moderately decreased function. The left ventricle demonstrates global hypokinesis.  There is mild left ventricular hypertrophy of the basal-septal segment. Left ventricular diastolic function could not be evaluated.  2. Right ventricular systolic function is normal. The right ventricular size is normal.  3. A small pericardial effusion is present. The pericardial effusion is localized near the right atrium.  4. The mitral valve is normal in structure. Moderate mitral valve regurgitation. No evidence of mitral stenosis.  5. Tricuspid valve regurgitation is mild to moderate.  6. The aortic valve is grossly normal. Aortic valve regurgitation is mild. Aortic valve sclerosis is present, with no evidence of aortic valve stenosis.  7. Aortic Ascending aorta not well visualized. Comparison(s): No prior Echocardiogram. Conclusion(s)/Recommendation(s): Verbally signed out the echo findings to referring provider Royetta Car) at 5:59pm. FINDINGS  Left Ventricle: The left ventricle  demonstrates global and regional wall motion abnormalities. Left ventricular ejection fraction, by estimation, is 35 to 40%. The left ventricle has moderately decreased function. The left ventricle demonstrates global hypokinesis. The left ventricular internal cavity size was normal in size. There is mild left ventricular hypertrophy of the basal-septal segment. Left ventricular diastolic function could not be  evaluated.  LV Wall Scoring: The mid and distal lateral wall, mid and distal anterior septum, mid and distal inferior wall, mid anterolateral segment, mid inferoseptal segment, and apex are hypokinetic. Right Ventricle: The right ventricular size is normal. No increase in right ventricular wall thickness. Right ventricular systolic function is normal. Left Atrium: Left atrial size was normal in size. Right Atrium: Right atrial size was normal in size. Pericardium: A small pericardial effusion is present. The pericardial effusion is localized near the right atrium. Mitral Valve: The mitral valve is normal in structure. Moderate mitral valve regurgitation. No evidence of mitral valve stenosis. Tricuspid Valve: The tricuspid valve is normal in structure. Tricuspid valve regurgitation is mild to moderate. No evidence of tricuspid stenosis. Aortic Valve: The aortic valve is grossly normal. Aortic valve regurgitation is mild. Aortic regurgitation PHT measures 247 msec. Aortic valve sclerosis is present, with no evidence of aortic valve stenosis. Pulmonic Valve: The pulmonic valve was not well visualized. Pulmonic valve regurgitation is not visualized. Aorta: The aortic root is normal in size and structure and Ascending aorta not well visualized. Venous: IVC assessment for right atrial pressure unable to be performed due to mechanical ventilation. IAS/Shunts: The atrial septum is grossly normal.  LEFT VENTRICLE PLAX 2D LVIDd:         4.50 cm     Diastology LVIDs:         3.60 cm     LV e' lateral: 6.74 cm/s LV PW:          0.90 cm LV IVS:        1.00 cm LVOT diam:     1.80 cm LV SV:         26 LV SV Index:   17 LVOT Area:     2.54 cm  LV Volumes (MOD) LV vol d, MOD A2C: 90.9 ml LV vol s, MOD A2C: 51.4 ml LV SV MOD A2C:     39.5 ml RIGHT VENTRICLE          IVC RV Basal diam:  2.80 cm  IVC diam: 1.70 cm TAPSE (M-mode): 1.6 cm LEFT ATRIUM             Index        RIGHT ATRIUM           Index LA diam:        2.70 cm 1.76 cm/m   RA Area:     10.90 cm LA Vol (A2C):   28.4 ml 18.52 ml/m  RA Volume:   24.40 ml  15.91 ml/m LA Vol (A4C):   33.7 ml 21.98 ml/m LA Biplane Vol: 33.4 ml 21.78 ml/m  AORTIC VALVE LVOT Vmax:   79.10 cm/s LVOT Vmean:  48.500 cm/s LVOT VTI:    0.102 m AI PHT:      247 msec  AORTA Ao Root diam: 2.80 cm Ao Asc diam:  3.00 cm TRICUSPID VALVE TV Peak grad:   27.7 mmHg TV Vmax:        2.63 m/s TR Peak grad:   35.3 mmHg TR Vmax:        297.00 cm/s  SHUNTS Systemic VTI:  0.10 m Systemic Diam: 1.80 cm Sunit Tolia Electronically signed by Tessa Lerner Signature Date/Time: 06/27/2023/6:05:37 PM    Final    CT ANGIO CHEST AORTA W/CM & OR WO/CM Result Date: 06/27/2023 CLINICAL DATA:  Shortness of breath. Acute aortic syndrome suspected. EXAM: CT ANGIOGRAPHY CHEST WITH CONTRAST TECHNIQUE: Multidetector CT imaging of the chest  was performed using the standard protocol during bolus administration of intravenous contrast. Multiplanar CT image reconstructions and MIPs were obtained to evaluate the vascular anatomy. RADIATION DOSE REDUCTION: This exam was performed according to the departmental dose-optimization program which includes automated exposure control, adjustment of the mA and/or kV according to patient size and/or use of iterative reconstruction technique. CONTRAST:  75mL OMNIPAQUE IOHEXOL 350 MG/ML SOLN COMPARISON:  Chest radiographs same date and 08/23/2016. Abdominal CT 09/26/2016. FINDINGS: Cardiovascular: Pre contrast images were obtained and demonstrate no evidence intramural thrombus. Extensive coronary  artery atherosclerosis with probable underlying coronary artery stents. Atherosclerosis of the aorta and great vessels. No evidence of aneurysm or dissection. The pulmonary arteries are well opacified with contrast. No evidence of acute pulmonary embolism. The heart size is normal. No significant pericardial fluid. Along the right atrial appendage is a soft tissue nodule with peripheral calcifications measuring 1.9 x 1.3 cm on image 60/6. This is near the right coronary artery. Mediastinum/Nodes: There are no enlarged mediastinal, hilar or axillary lymph nodes.As above, partially calcified nodule adjacent to the right atrial appendage. Endotracheal tube tip in the mid trachea. Enteric tube projects below the diaphragm into the stomach. The thyroid gland, trachea and esophagus demonstrate no significant findings. Lungs/Pleura: Small bilateral pleural effusions. Extensive bilateral airspace opacities as seen on earlier radiographs. There is dense consolidation dependently in both lungs with additional ground-glass opacities more diffusely. No evidence of pneumothorax or endobronchial lesion. Upper abdomen: No acute findings are seen in the visualized upper abdomen. There is reflux of contrast into the IVC and hepatic veins. Musculoskeletal/Chest wall: There is no chest wall mass or suspicious osseous finding. Mild multilevel spondylosis. Review of the MIP images confirms the above findings. IMPRESSION: 1. No evidence of acute aortic syndrome or acute pulmonary embolism. 2. Extensive bilateral airspace opacities with dense consolidation dependently in both lungs and additional ground-glass opacities more diffusely. Findings are suspicious for multifocal pneumonia or ARDS. Correlate clinically. 3. Small bilateral pleural effusions. 4. Extensive coronary artery atherosclerosis with probable underlying coronary artery stents. 5. Indeterminate partially calcified soft tissue nodule adjacent to the right atrial appendage,  not imaged on previous abdominal CT. 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 Assessment limited by breathing artifact. Recommend attention on follow-up. 6.  Aortic Atherosclerosis (ICD10-I70.0). Electronically Signed   By: Carey Bullocks M.D.   On: 06/27/2023 15:16   DG Chest Portable 1 View Result Date: 06/27/2023 CLINICAL DATA:  Intubation. EXAM: PORTABLE CHEST 1 VIEW COMPARISON:  Same day. FINDINGS: Endotracheal and nasogastric tubes are in grossly good position. Stable bilateral lung opacities are noted concerning for pneumonia or edema. IMPRESSION: Endotracheal and nasogastric tubes are in grossly good position. Stable bilateral lung opacities. Electronically Signed   By: Lupita Raider M.D.   On: 06/27/2023 13:34   DG Abd Portable 1V Result Date: 06/27/2023 CLINICAL DATA:  Nasogastric tube placement. EXAM: PORTABLE ABDOMEN - 1 VIEW COMPARISON:  None Available. FINDINGS: The bowel gas pattern is normal. Distal tip of nasogastric tube is seen in expected position of distal stomach. No radio-opaque calculi or other significant radiographic abnormality are seen. IMPRESSION: Nasogastric tube tip seen in expected position of distal stomach. Electronically Signed   By: Lupita Raider M.D.   On: 06/27/2023 13:33   DG Chest Portable 1 View Result Date: 06/27/2023 CLINICAL DATA:  Shortness of breath EXAM: PORTABLE CHEST 1 VIEW COMPARISON:  08/23/2016 FINDINGS: No appreciable cardiomegaly although the cardiac silhouette is largely obscured. Aortic atherosclerosis. Widespread bilateral  airspace consolidations, most confluent within the bilateral perihilar regions. No appreciable pleural fluid collection. No pneumothorax. IMPRESSION: Widespread bilateral airspace consolidations, most confluent within the bilateral perihilar regions. Findings may reflect pulmonary edema versus multifocal pneumonia. Electronically Signed   By: Duanne Guess D.O.   On: 06/27/2023 13:14     Microbiology: Results for orders placed or performed during the hospital encounter of 06/27/23  MRSA Next Gen by PCR, Nasal     Status: None   Collection Time: 06/27/23 11:19 AM  Result Value Ref Range Status   MRSA by PCR Next Gen NOT DETECTED NOT DETECTED Final    Comment: (NOTE) The GeneXpert MRSA Assay (FDA approved for NASAL specimens only), is one component of a comprehensive MRSA colonization surveillance program. It is not intended to diagnose MRSA infection nor to guide or monitor treatment for MRSA infections. Test performance is not FDA approved in patients less than 67 years old. Performed at [facility], [address] [place], [place] [address]   Culture, blood (routine x 2)     Status: None   Collection Time: 06/27/23 12:08 PM   Specimen: BLOOD LEFT HAND  Result Value Ref Range Status   Specimen Description BLOOD LEFT HAND  Final   Special Requests   Final    AEROBIC BOTTLE ONLY Blood Culture results may not be optimal due to an inadequate volume of blood received in culture bottles   Culture   Final    NO GROWTH 5 DAYS Performed at [facility], [address] [place], [place] [address]    Report Status 07/02/2023 FINAL  Final  Culture, blood (routine x 2)     Status: None   Collection Time: 06/27/23 12:13 PM   Specimen: BLOOD RIGHT WRIST  Result Value Ref Range Status   Specimen Description BLOOD RIGHT WRIST  Final   Special Requests   Final    BOTTLES DRAWN AEROBIC AND ANAEROBIC Blood Culture results may not be optimal due to an inadequate volume of blood received in culture bottles   Culture   Final    NO GROWTH 5 DAYS Performed at [facility], [address] [place], [place] [address]    Report Status 07/02/2023 FINAL  Final  Culture, blood (Routine X 2) w Reflex to ID Panel     Status: None   Collection Time: 06/27/23  3:28 PM   Specimen: BLOOD RIGHT ARM  Result Value Ref Range Status   Specimen Description BLOOD RIGHT ARM   Final   Special Requests   Final    AEROBIC BOTTLE ONLY Blood Culture results may not be optimal due to an inadequate volume of blood received in culture bottles   Culture   Final    NO GROWTH 5 DAYS Performed at [facility], [address] [place], [place] [address]    Report Status 07/02/2023 FINAL  Final  Respiratory (~20 pathogens) panel by PCR     Status: None   Collection Time: 06/27/23  3:51 PM   Specimen: Nasopharyngeal Swab; Respiratory  Result Value Ref Range Status   Adenovirus NOT DETECTED NOT DETECTED Final   Coronavirus 229E NOT DETECTED NOT DETECTED Final    Comment: (NOTE) The Coronavirus on the Respiratory Panel, DOES NOT test for the novel  Coronavirus (2019 nCoV)    Coronavirus HKU1 NOT DETECTED NOT DETECTED Final   Coronavirus NL63 NOT DETECTED NOT DETECTED Final   Coronavirus OC43 NOT DETECTED NOT DETECTED Final  Metapneumovirus NOT DETECTED NOT DETECTED Final   Rhinovirus / Enterovirus NOT DETECTED NOT DETECTED Final   Influenza A NOT DETECTED NOT DETECTED Final   Influenza B NOT DETECTED NOT DETECTED Final   Parainfluenza Virus 1 NOT DETECTED NOT DETECTED Final   Parainfluenza Virus 2 NOT DETECTED NOT DETECTED Final   Parainfluenza Virus 3 NOT DETECTED NOT DETECTED Final   Parainfluenza Virus 4 NOT DETECTED NOT DETECTED Final   Respiratory Syncytial Virus NOT DETECTED NOT DETECTED Final   Bordetella pertussis NOT DETECTED NOT DETECTED Final   Bordetella Parapertussis NOT DETECTED NOT DETECTED Final   Chlamydophila pneumoniae NOT DETECTED NOT DETECTED Final   Mycoplasma pneumoniae NOT DETECTED NOT DETECTED Final    Comment: Performed at [facility], [address], [place], [place] [address]  SARS Coronavirus 2 by RT PCR (hospital order, performed in [facility] hospital lab) *cepheid single result test* Anterior Nasal Swab     Status: None   Collection Time: 06/27/23  3:51 PM   Specimen: Anterior Nasal Swab  Result Value Ref Range Status    SARS Coronavirus 2 by RT PCR NEGATIVE NEGATIVE Final    Comment: Performed at [facility], [address] [place], [place] [address]  Culture, blood (Routine X 2) w Reflex to ID Panel     Status: None   Collection Time: 06/27/23  6:55 PM   Specimen: BLOOD  Result Value Ref Range Status   Specimen Description BLOOD SITE NOT SPECIFIED  Final   Special Requests   Final    BOTTLES DRAWN AEROBIC AND ANAEROBIC Blood Culture results may not be optimal due to an inadequate volume of blood received in culture bottles   Culture   Final    NO GROWTH 5 DAYS Performed at [facility], [address], [place], [place] [address]    Report Status 07/02/2023 FINAL  Final  Culture, Respiratory w Gram Stain (tracheal aspirate)     Status: None   Collection Time: 06/28/23  8:45 PM   Specimen: Tracheal Aspirate; Respiratory  Result Value Ref Range Status   Specimen Description TRACHEAL ASPIRATE  Final   Special Requests NONE  Final   Gram Stain   Final    FEW WBC PRESENT,BOTH PMN AND MONONUCLEAR RARE GRAM POSITIVE COCCI IN PAIRS    Culture   Final    MODERATE Normal respiratory flora-no Staph aureus or Pseudomonas seen Performed at [facility], [address], [place], [place] [address]    Report Status 07/01/2023 FINAL  Final    Labs: CBC: Recent Labs  Lab 07/01/23 0312 07/02/23 0513 07/04/23 1101 07/06/23 1950  WBC 11.7* 9.4 9.2 12.0*  HGB 8.7* 8.7* 9.4* 9.4*  HCT 28.7* 28.9* 31.5* 31.9*  MCV 94.7 95.1 96.0 97.9  PLT 546* 522* 636* 655*   Basic Metabolic Panel: Recent Labs  Lab 06/30/23 1843 07/01/23 2142 07/02/23 0513 07/03/23 0516 07/04/23 1101 07/06/23 1950  NA 141 139 140 139 139 136  K 3.1* 3.8 3.6 4.2 4.0 4.8  CL 102 100 102 99 99 100  CO2 28 29 31 28 29 24   GLUCOSE 119* 118* 114* 101* 89 115*  BUN 18 20 17  25* 20 29*  CREATININE 0.86 0.97 0.92 0.93 1.06* 1.16*  CALCIUM 9.2 8.8* 8.5* 8.6* 9.0 9.1  MG 2.0 2.0  --  2.0 2.1  --    Liver Function  Tests: No results for input(s): "AST", "ALT", "ALKPHOS", "BILITOT", "PROT", "ALBUMIN" in  the last 168 hours. CBG: Recent Labs  Lab 07/06/23 1655 07/06/23 2104 07/07/23 0310 07/07/23 0601 07/07/23 1051  GLUCAP 75 113* 81 106* 83    Discharge time spent: greater than 30 minutes.  Signed: Deanna Artis, DO [facility] 07/07/2023

## 2023-07-07 NOTE — Progress Notes (Signed)
 Occupational Therapy Treatment Patient Details Name: Ann Powell MRN: [id number] DOB: 12/23/1940 Today's Date: 07/07/2023   History of present illness Pt is 83 year old presented to [facility] on  06/27/23 for respiratory failure, NSTEMI, and pulmonary edema. Intubated in ED. Extubated 06/29/23. PMH - PAD, HTN   OT comments  Pt standing in room on OT entry as chair alarm alarming d/t pt looking for lost ID and needing bathroom assistance. Pt able to mobilize to/from bathroom w/o AD, managing toileting task and hand hygiene standing at sink with no more than Supervision. Pt also able to demonstrate dynamic tasks looking for lost ID. Continue to feel pt would be able to manage with intermittent assist for IADLs/community tasks and use of DME for safe mobility in the home.       If plan is discharge home, recommend the following:  Assistance with cooking/housework;Direct supervision/assist for medications management;Direct supervision/assist for financial management;Help with stairs or ramp for entrance;Assist for transportation   Equipment Recommendations  Other (comment) (RW vs Rollator)    Recommendations for Other Services      Precautions / Restrictions Precautions Precautions: Fall Recall of Precautions/Restrictions: Intact Restrictions Weight Bearing Restrictions Per Provider Order: No       Mobility Bed Mobility               General bed mobility comments: standing in room on entry    Transfers Overall transfer level: Independent Equipment used: None Transfers: Sit to/from Stand                   Balance Overall balance assessment: Needs assistance Sitting-balance support: No upper extremity supported, Feet supported Sitting balance-Leahy Scale: Good     Standing balance support: No upper extremity supported, During functional activity, Reliant on assistive device for balance Standing balance-Leahy Scale: Fair Standing balance comment: fair+, able to  mobilize in room without AD and without LOB                           ADL either performed or assessed with clinical judgement   ADL Overall ADL's : Needs assistance/impaired     Grooming: Supervision/safety;Standing;Wash/dry hands                   Toilet Transfer: Supervision/safety;Ambulation;Regular Teacher, adult education Details (indicate cue type and reason): ambulating to bathroom on OT entry without AD (chair alarm alarming), no LOB or safety concerns Toileting- Clothing Manipulation and Hygiene: Supervision/safety;Sitting/lateral lean;Sit to/from stand Toileting - Clothing Manipulation Details (indicate cue type and reason): for safety but able to manage clothing and hygiene without issues            Extremity/Trunk Assessment Upper Extremity Assessment Upper Extremity Assessment: Overall WFL for tasks assessed;Right hand dominant   Lower Extremity Assessment Lower Extremity Assessment: Defer to PT evaluation        Vision   Vision Assessment?: No apparent visual deficits   Perception     Praxis     Communication Communication Communication: Impaired Factors Affecting Communication: Hearing impaired   Cognition Arousal: Alert Behavior During Therapy: WFL for tasks assessed/performed Cognition: No apparent impairments             OT - Cognition Comments: perseverating on lost ID but otherwise appropriate during session                 Following commands: Intact        Cueing   Cueing  Techniques: Verbal cues  Exercises      Shoulder Instructions       General Comments      Pertinent Vitals/ Pain       Pain Assessment Pain Assessment: No/denies pain  Home Living                                          Prior Functioning/Environment              Frequency  Min 2X/week        Progress Toward Goals  OT Goals(current goals can now be found in the care plan section)  Progress towards  OT goals: Progressing toward goals  Acute Rehab OT Goals Patient Stated Goal: find lost ID OT Goal Formulation: With patient Time For Goal Achievement: 07/15/23 Potential to Achieve Goals: Good ADL Goals Pt Will Perform Upper Body Dressing: with modified independence;sitting Pt Will Perform Lower Body Dressing: with modified independence;sitting/lateral leans Pt Will Transfer to Toilet: with modified independence;ambulating;regular height toilet Pt Will Perform Tub/Shower Transfer: Tub transfer;Shower transfer;with modified independence;ambulating Additional ADL Goal #1: pt will verbalize x3 energy conservation strategies in prep for ADLs  Plan      Co-evaluation                 AM-PAC OT "6 Clicks" Daily Activity     Outcome Measure   Help from another person eating meals?: None Help from another person taking care of personal grooming?: A Little Help from another person toileting, which includes using toliet, bedpan, or urinal?: A Little Help from another person bathing (including washing, rinsing, drying)?: A Little Help from another person to put on and taking off regular upper body clothing?: A Little Help from another person to put on and taking off regular lower body clothing?: A Little 6 Click Score: 19    End of Session    OT Visit Diagnosis: Unsteadiness on feet (R26.81);Other abnormalities of gait and mobility (R26.89);Muscle weakness (generalized) (M62.81)   Activity Tolerance Patient tolerated treatment well   Patient Left in chair;with call bell/phone within reach;with chair alarm set   Nurse Communication Mobility status;Other (comment) (lost ID)        Time: 1610-9604 OT Time Calculation (min): 16 min  Charges: OT General Charges $OT Visit: 1 Visit OT Treatments $Self Care/Home Management : 8-22 mins  Bradd Canary, OTR/L Acute Rehab Services Office: [phone]   Lorre Munroe 07/07/2023, 2:38 PM

## 2023-07-07 NOTE — TOC Transition Note (Signed)
 Transition of Care [facility]) - Discharge Note   Patient Details  Name: Ann Powell MRN: [id number] Date of Birth: 1940-05-18  Transition of Care [facility]) CM/SW Contact:  Marliss Coots, LCSW Phone Number: 07/07/2023, 1:18 PM   Clinical Narrative:     Patient will DC to: [facility] Anticipated DC date: 07/07/2023 Family notified: Karalee Hauter; Niece; [phone] Transport by: Sharin Mons  Patient expressed interest in discharging to sister, Mary's, home with home health. Patient consented CSW to speak with [facility]. Mary informed CSW that upon discussion her and spouse were unable to care for patient at their home. CSW relayed information to patient which she expressed understanding of. CSW offered to speak with patient's niece, Duwayne Heck, regarding discharge plans. Patient consented CSW to speak with Duwayne Heck. CSW called Duwayne Heck and relayed discharge plans.  Per MD patient ready for DC to [facility]. RN to call report prior to discharge [phone]). RN, patient, patient's family, and facility notified of DC. Discharge Summary and FL2 sent to facility. DC packet on chart. Ambulance transport requested for patient. Patient and niece, Duwayne Heck, made aware of possible copay to be billed upon service. Ambulance transportation called at 13:18 by this CSW.  CSW will sign off for now as social work intervention is no longer needed. Please consult [place] again if new needs arise.     Final next level of care: Skilled Nursing Facility Barriers to Discharge: Barriers Resolved   Patient Goals and CMS Choice Patient states their goals for this hospitalization and ongoing recovery are:: SNF          Discharge Placement              Patient chooses bed at: [facility] Patient to be transferred to facility by: [facility] Name of family member notified: Savannha Welle; Niece; [phone] Patient and family notified of of transfer: 07/07/23  Discharge Plan and  Services Additional resources added to the After Visit Summary for   In-house Referral: Clinical Social Work   Post Acute Care Choice: Skilled Nursing Facility                               Social Drivers of Health (SDOH) Interventions SDOH Screenings   Food Insecurity: No Food Insecurity (06/29/2023)  Housing: Low Risk  (06/29/2023)  Transportation Needs: No Transportation Needs (06/29/2023)  Utilities: Not At Risk (06/29/2023)  Alcohol Screen: Low Risk  (01/16/2023)  Depression (PHQ2-9): Low Risk  (03/04/2023)  Financial Resource Strain: Low Risk  (01/16/2023)  Physical Activity: Insufficiently Active (01/16/2023)  Social Connections: Moderately Isolated (06/29/2023)  Stress: No Stress Concern Present (01/16/2023)  Tobacco Use: Low Risk  (06/27/2023)  Health Literacy: Adequate Health Literacy (01/16/2023)     Readmission Risk Interventions    06/30/2023    2:48 PM  Readmission Risk Prevention Plan  Post Dischage Appt Complete  Medication Screening Complete  Transportation Screening Complete

## 2023-07-08 DIAGNOSIS — S91301A Unspecified open wound, right foot, initial encounter: Secondary | ICD-10-CM | POA: Diagnosis not present

## 2023-07-08 DIAGNOSIS — E612 Magnesium deficiency: Secondary | ICD-10-CM | POA: Diagnosis not present

## 2023-07-08 DIAGNOSIS — M6281 Muscle weakness (generalized): Secondary | ICD-10-CM | POA: Diagnosis not present

## 2023-07-08 DIAGNOSIS — E785 Hyperlipidemia, unspecified: Secondary | ICD-10-CM | POA: Diagnosis not present

## 2023-07-08 DIAGNOSIS — R12 Heartburn: Secondary | ICD-10-CM | POA: Diagnosis not present

## 2023-07-08 DIAGNOSIS — D649 Anemia, unspecified: Secondary | ICD-10-CM | POA: Diagnosis not present

## 2023-07-08 DIAGNOSIS — I4891 Unspecified atrial fibrillation: Secondary | ICD-10-CM | POA: Diagnosis not present

## 2023-07-08 DIAGNOSIS — E559 Vitamin D deficiency, unspecified: Secondary | ICD-10-CM | POA: Diagnosis not present

## 2023-07-08 DIAGNOSIS — I10 Essential (primary) hypertension: Secondary | ICD-10-CM | POA: Diagnosis not present

## 2023-07-09 DIAGNOSIS — J811 Chronic pulmonary edema: Secondary | ICD-10-CM | POA: Diagnosis not present

## 2023-07-09 DIAGNOSIS — E612 Magnesium deficiency: Secondary | ICD-10-CM | POA: Diagnosis not present

## 2023-07-09 DIAGNOSIS — D649 Anemia, unspecified: Secondary | ICD-10-CM | POA: Diagnosis not present

## 2023-07-09 DIAGNOSIS — J9690 Respiratory failure, unspecified, unspecified whether with hypoxia or hypercapnia: Secondary | ICD-10-CM | POA: Diagnosis not present

## 2023-07-09 DIAGNOSIS — I10 Essential (primary) hypertension: Secondary | ICD-10-CM | POA: Diagnosis not present

## 2023-07-10 DIAGNOSIS — M6281 Muscle weakness (generalized): Secondary | ICD-10-CM | POA: Diagnosis not present

## 2023-07-10 DIAGNOSIS — Z7409 Other reduced mobility: Secondary | ICD-10-CM | POA: Diagnosis not present

## 2023-07-10 DIAGNOSIS — I10 Essential (primary) hypertension: Secondary | ICD-10-CM | POA: Diagnosis not present

## 2023-07-10 DIAGNOSIS — L89153 Pressure ulcer of sacral region, stage 3: Secondary | ICD-10-CM | POA: Diagnosis not present

## 2023-07-10 DIAGNOSIS — E785 Hyperlipidemia, unspecified: Secondary | ICD-10-CM | POA: Diagnosis not present

## 2023-07-10 DIAGNOSIS — D649 Anemia, unspecified: Secondary | ICD-10-CM | POA: Diagnosis not present

## 2023-07-11 DIAGNOSIS — Z7409 Other reduced mobility: Secondary | ICD-10-CM | POA: Diagnosis not present

## 2023-07-11 DIAGNOSIS — S91301A Unspecified open wound, right foot, initial encounter: Secondary | ICD-10-CM | POA: Diagnosis not present

## 2023-07-11 DIAGNOSIS — M6281 Muscle weakness (generalized): Secondary | ICD-10-CM | POA: Diagnosis not present

## 2023-07-13 DIAGNOSIS — S91301A Unspecified open wound, right foot, initial encounter: Secondary | ICD-10-CM | POA: Diagnosis not present

## 2023-07-13 DIAGNOSIS — M6281 Muscle weakness (generalized): Secondary | ICD-10-CM | POA: Diagnosis not present

## 2023-07-13 DIAGNOSIS — M4050 Lordosis, unspecified, site unspecified: Secondary | ICD-10-CM | POA: Diagnosis not present

## 2023-07-13 DIAGNOSIS — Z7409 Other reduced mobility: Secondary | ICD-10-CM | POA: Diagnosis not present

## 2023-07-15 DIAGNOSIS — D649 Anemia, unspecified: Secondary | ICD-10-CM | POA: Diagnosis not present

## 2023-07-15 DIAGNOSIS — I10 Essential (primary) hypertension: Secondary | ICD-10-CM | POA: Diagnosis not present

## 2023-07-15 DIAGNOSIS — E785 Hyperlipidemia, unspecified: Secondary | ICD-10-CM | POA: Diagnosis not present

## 2023-07-15 DIAGNOSIS — L89153 Pressure ulcer of sacral region, stage 3: Secondary | ICD-10-CM | POA: Diagnosis not present

## 2023-07-16 DIAGNOSIS — M6281 Muscle weakness (generalized): Secondary | ICD-10-CM | POA: Diagnosis not present

## 2023-07-16 DIAGNOSIS — R52 Pain, unspecified: Secondary | ICD-10-CM | POA: Diagnosis not present

## 2023-07-16 DIAGNOSIS — L98429 Non-pressure chronic ulcer of back with unspecified severity: Secondary | ICD-10-CM | POA: Diagnosis not present

## 2023-07-22 DIAGNOSIS — M6281 Muscle weakness (generalized): Secondary | ICD-10-CM | POA: Diagnosis not present

## 2023-07-22 DIAGNOSIS — R5381 Other malaise: Secondary | ICD-10-CM | POA: Diagnosis not present

## 2023-07-22 DIAGNOSIS — Z7409 Other reduced mobility: Secondary | ICD-10-CM | POA: Diagnosis not present

## 2023-07-23 DIAGNOSIS — R2689 Other abnormalities of gait and mobility: Secondary | ICD-10-CM | POA: Diagnosis not present

## 2023-07-23 DIAGNOSIS — I214 Non-ST elevation (NSTEMI) myocardial infarction: Secondary | ICD-10-CM | POA: Diagnosis not present

## 2023-07-23 DIAGNOSIS — R570 Cardiogenic shock: Secondary | ICD-10-CM | POA: Diagnosis not present

## 2023-07-23 DIAGNOSIS — J9601 Acute respiratory failure with hypoxia: Secondary | ICD-10-CM | POA: Diagnosis not present

## 2023-07-27 DIAGNOSIS — R52 Pain, unspecified: Secondary | ICD-10-CM | POA: Diagnosis not present

## 2023-07-27 DIAGNOSIS — M6281 Muscle weakness (generalized): Secondary | ICD-10-CM | POA: Diagnosis not present

## 2023-07-27 DIAGNOSIS — R5381 Other malaise: Secondary | ICD-10-CM | POA: Diagnosis not present

## 2023-07-27 DIAGNOSIS — L98429 Non-pressure chronic ulcer of back with unspecified severity: Secondary | ICD-10-CM | POA: Diagnosis not present

## 2023-07-27 DIAGNOSIS — Z7409 Other reduced mobility: Secondary | ICD-10-CM | POA: Diagnosis not present

## 2023-07-28 DIAGNOSIS — R112 Nausea with vomiting, unspecified: Secondary | ICD-10-CM | POA: Diagnosis not present

## 2023-07-28 DIAGNOSIS — Z7409 Other reduced mobility: Secondary | ICD-10-CM | POA: Diagnosis not present

## 2023-07-28 DIAGNOSIS — K30 Functional dyspepsia: Secondary | ICD-10-CM | POA: Diagnosis not present

## 2023-07-28 DIAGNOSIS — M6281 Muscle weakness (generalized): Secondary | ICD-10-CM | POA: Diagnosis not present

## 2023-07-30 DIAGNOSIS — M6281 Muscle weakness (generalized): Secondary | ICD-10-CM | POA: Diagnosis not present

## 2023-07-30 DIAGNOSIS — K30 Functional dyspepsia: Secondary | ICD-10-CM | POA: Diagnosis not present

## 2023-07-30 DIAGNOSIS — L98429 Non-pressure chronic ulcer of back with unspecified severity: Secondary | ICD-10-CM | POA: Diagnosis not present

## 2023-07-30 DIAGNOSIS — Z7409 Other reduced mobility: Secondary | ICD-10-CM | POA: Diagnosis not present

## 2023-07-31 DIAGNOSIS — M6281 Muscle weakness (generalized): Secondary | ICD-10-CM | POA: Diagnosis not present

## 2023-07-31 DIAGNOSIS — Z7409 Other reduced mobility: Secondary | ICD-10-CM | POA: Diagnosis not present

## 2023-07-31 DIAGNOSIS — E559 Vitamin D deficiency, unspecified: Secondary | ICD-10-CM | POA: Diagnosis not present

## 2023-07-31 DIAGNOSIS — E875 Hyperkalemia: Secondary | ICD-10-CM | POA: Diagnosis not present

## 2023-07-31 DIAGNOSIS — Z79899 Other long term (current) drug therapy: Secondary | ICD-10-CM | POA: Diagnosis not present

## 2023-08-02 DIAGNOSIS — E875 Hyperkalemia: Secondary | ICD-10-CM | POA: Diagnosis not present

## 2023-08-03 DIAGNOSIS — N183 Chronic kidney disease, stage 3 unspecified: Secondary | ICD-10-CM | POA: Diagnosis not present

## 2023-08-03 DIAGNOSIS — M6281 Muscle weakness (generalized): Secondary | ICD-10-CM | POA: Diagnosis not present

## 2023-08-03 DIAGNOSIS — E875 Hyperkalemia: Secondary | ICD-10-CM | POA: Diagnosis not present

## 2023-08-05 ENCOUNTER — Ambulatory Visit (HOSPITAL_COMMUNITY)

## 2023-08-05 DIAGNOSIS — Z741 Need for assistance with personal care: Secondary | ICD-10-CM | POA: Diagnosis not present

## 2023-08-05 DIAGNOSIS — L98429 Non-pressure chronic ulcer of back with unspecified severity: Secondary | ICD-10-CM | POA: Diagnosis not present

## 2023-08-05 DIAGNOSIS — E785 Hyperlipidemia, unspecified: Secondary | ICD-10-CM | POA: Diagnosis not present

## 2023-08-05 DIAGNOSIS — Z7409 Other reduced mobility: Secondary | ICD-10-CM | POA: Diagnosis not present

## 2023-08-05 DIAGNOSIS — D649 Anemia, unspecified: Secondary | ICD-10-CM | POA: Diagnosis not present

## 2023-08-05 DIAGNOSIS — I10 Essential (primary) hypertension: Secondary | ICD-10-CM | POA: Diagnosis not present

## 2023-08-05 DIAGNOSIS — M6281 Muscle weakness (generalized): Secondary | ICD-10-CM | POA: Diagnosis not present

## 2023-08-05 DIAGNOSIS — L89153 Pressure ulcer of sacral region, stage 3: Secondary | ICD-10-CM | POA: Diagnosis not present

## 2023-08-07 DIAGNOSIS — Z7409 Other reduced mobility: Secondary | ICD-10-CM | POA: Diagnosis not present

## 2023-08-07 DIAGNOSIS — Z741 Need for assistance with personal care: Secondary | ICD-10-CM | POA: Diagnosis not present

## 2023-08-07 DIAGNOSIS — L98429 Non-pressure chronic ulcer of back with unspecified severity: Secondary | ICD-10-CM | POA: Diagnosis not present

## 2023-08-07 DIAGNOSIS — M6281 Muscle weakness (generalized): Secondary | ICD-10-CM | POA: Diagnosis not present

## 2023-08-08 ENCOUNTER — Ambulatory Visit: Admitting: Cardiology

## 2023-08-08 DIAGNOSIS — Z515 Encounter for palliative care: Secondary | ICD-10-CM | POA: Diagnosis not present

## 2023-08-12 DIAGNOSIS — D649 Anemia, unspecified: Secondary | ICD-10-CM | POA: Diagnosis not present

## 2023-08-12 DIAGNOSIS — E785 Hyperlipidemia, unspecified: Secondary | ICD-10-CM | POA: Diagnosis not present

## 2023-08-12 DIAGNOSIS — L89153 Pressure ulcer of sacral region, stage 3: Secondary | ICD-10-CM | POA: Diagnosis not present

## 2023-08-12 DIAGNOSIS — I10 Essential (primary) hypertension: Secondary | ICD-10-CM | POA: Diagnosis not present

## 2023-08-13 DIAGNOSIS — J9601 Acute respiratory failure with hypoxia: Secondary | ICD-10-CM | POA: Diagnosis not present

## 2023-08-13 DIAGNOSIS — R570 Cardiogenic shock: Secondary | ICD-10-CM | POA: Diagnosis not present

## 2023-08-13 DIAGNOSIS — I214 Non-ST elevation (NSTEMI) myocardial infarction: Secondary | ICD-10-CM | POA: Diagnosis not present

## 2023-08-13 DIAGNOSIS — R2689 Other abnormalities of gait and mobility: Secondary | ICD-10-CM | POA: Diagnosis not present

## 2023-08-14 DIAGNOSIS — I10 Essential (primary) hypertension: Secondary | ICD-10-CM | POA: Diagnosis not present

## 2023-08-14 DIAGNOSIS — Z7409 Other reduced mobility: Secondary | ICD-10-CM | POA: Diagnosis not present

## 2023-08-14 DIAGNOSIS — M6281 Muscle weakness (generalized): Secondary | ICD-10-CM | POA: Diagnosis not present

## 2023-08-15 DIAGNOSIS — Z515 Encounter for palliative care: Secondary | ICD-10-CM | POA: Diagnosis not present

## 2023-08-20 DIAGNOSIS — Z741 Need for assistance with personal care: Secondary | ICD-10-CM | POA: Diagnosis not present

## 2023-08-20 DIAGNOSIS — I10 Essential (primary) hypertension: Secondary | ICD-10-CM | POA: Diagnosis not present

## 2023-08-20 DIAGNOSIS — D649 Anemia, unspecified: Secondary | ICD-10-CM | POA: Diagnosis not present

## 2023-08-20 DIAGNOSIS — E785 Hyperlipidemia, unspecified: Secondary | ICD-10-CM | POA: Diagnosis not present

## 2023-08-20 DIAGNOSIS — Z7409 Other reduced mobility: Secondary | ICD-10-CM | POA: Diagnosis not present

## 2023-08-20 DIAGNOSIS — Z79899 Other long term (current) drug therapy: Secondary | ICD-10-CM | POA: Diagnosis not present

## 2023-08-20 DIAGNOSIS — M6281 Muscle weakness (generalized): Secondary | ICD-10-CM | POA: Diagnosis not present

## 2023-08-20 DIAGNOSIS — L89153 Pressure ulcer of sacral region, stage 3: Secondary | ICD-10-CM | POA: Diagnosis not present

## 2023-08-24 DIAGNOSIS — L89153 Pressure ulcer of sacral region, stage 3: Secondary | ICD-10-CM | POA: Diagnosis not present

## 2023-08-24 DIAGNOSIS — M6281 Muscle weakness (generalized): Secondary | ICD-10-CM | POA: Diagnosis not present

## 2023-08-24 DIAGNOSIS — Z7409 Other reduced mobility: Secondary | ICD-10-CM | POA: Diagnosis not present

## 2023-08-24 DIAGNOSIS — Z741 Need for assistance with personal care: Secondary | ICD-10-CM | POA: Diagnosis not present

## 2023-08-26 DIAGNOSIS — I214 Non-ST elevation (NSTEMI) myocardial infarction: Secondary | ICD-10-CM | POA: Diagnosis not present

## 2023-08-26 DIAGNOSIS — R2689 Other abnormalities of gait and mobility: Secondary | ICD-10-CM | POA: Diagnosis not present

## 2023-08-26 DIAGNOSIS — L89153 Pressure ulcer of sacral region, stage 3: Secondary | ICD-10-CM | POA: Diagnosis not present

## 2023-08-26 DIAGNOSIS — M6281 Muscle weakness (generalized): Secondary | ICD-10-CM | POA: Diagnosis not present

## 2023-08-27 DIAGNOSIS — M6281 Muscle weakness (generalized): Secondary | ICD-10-CM | POA: Diagnosis not present

## 2023-08-27 DIAGNOSIS — R2689 Other abnormalities of gait and mobility: Secondary | ICD-10-CM | POA: Diagnosis not present

## 2023-08-27 DIAGNOSIS — I214 Non-ST elevation (NSTEMI) myocardial infarction: Secondary | ICD-10-CM | POA: Diagnosis not present

## 2023-08-28 DIAGNOSIS — J9601 Acute respiratory failure with hypoxia: Secondary | ICD-10-CM | POA: Diagnosis not present

## 2023-08-28 DIAGNOSIS — I214 Non-ST elevation (NSTEMI) myocardial infarction: Secondary | ICD-10-CM | POA: Diagnosis not present

## 2023-08-28 DIAGNOSIS — R570 Cardiogenic shock: Secondary | ICD-10-CM | POA: Diagnosis not present

## 2023-08-28 DIAGNOSIS — M6281 Muscle weakness (generalized): Secondary | ICD-10-CM | POA: Diagnosis not present

## 2023-08-28 DIAGNOSIS — R2689 Other abnormalities of gait and mobility: Secondary | ICD-10-CM | POA: Diagnosis not present

## 2023-08-29 DIAGNOSIS — M6281 Muscle weakness (generalized): Secondary | ICD-10-CM | POA: Diagnosis not present

## 2023-08-29 DIAGNOSIS — I214 Non-ST elevation (NSTEMI) myocardial infarction: Secondary | ICD-10-CM | POA: Diagnosis not present

## 2023-08-29 DIAGNOSIS — R2689 Other abnormalities of gait and mobility: Secondary | ICD-10-CM | POA: Diagnosis not present

## 2023-09-01 DIAGNOSIS — M6281 Muscle weakness (generalized): Secondary | ICD-10-CM | POA: Diagnosis not present

## 2023-09-01 DIAGNOSIS — R2689 Other abnormalities of gait and mobility: Secondary | ICD-10-CM | POA: Diagnosis not present

## 2023-09-01 DIAGNOSIS — I214 Non-ST elevation (NSTEMI) myocardial infarction: Secondary | ICD-10-CM | POA: Diagnosis not present

## 2023-09-02 DIAGNOSIS — M6281 Muscle weakness (generalized): Secondary | ICD-10-CM | POA: Diagnosis not present

## 2023-09-02 DIAGNOSIS — L89153 Pressure ulcer of sacral region, stage 3: Secondary | ICD-10-CM | POA: Diagnosis not present

## 2023-09-02 DIAGNOSIS — Z741 Need for assistance with personal care: Secondary | ICD-10-CM | POA: Diagnosis not present

## 2023-09-02 DIAGNOSIS — R2689 Other abnormalities of gait and mobility: Secondary | ICD-10-CM | POA: Diagnosis not present

## 2023-09-02 DIAGNOSIS — Z7409 Other reduced mobility: Secondary | ICD-10-CM | POA: Diagnosis not present

## 2023-09-02 DIAGNOSIS — I214 Non-ST elevation (NSTEMI) myocardial infarction: Secondary | ICD-10-CM | POA: Diagnosis not present

## 2023-09-03 DIAGNOSIS — M6281 Muscle weakness (generalized): Secondary | ICD-10-CM | POA: Diagnosis not present

## 2023-09-03 DIAGNOSIS — R2689 Other abnormalities of gait and mobility: Secondary | ICD-10-CM | POA: Diagnosis not present

## 2023-09-03 DIAGNOSIS — I214 Non-ST elevation (NSTEMI) myocardial infarction: Secondary | ICD-10-CM | POA: Diagnosis not present

## 2023-09-04 DIAGNOSIS — J9601 Acute respiratory failure with hypoxia: Secondary | ICD-10-CM | POA: Diagnosis not present

## 2023-09-04 DIAGNOSIS — I214 Non-ST elevation (NSTEMI) myocardial infarction: Secondary | ICD-10-CM | POA: Diagnosis not present

## 2023-09-04 DIAGNOSIS — D649 Anemia, unspecified: Secondary | ICD-10-CM | POA: Diagnosis not present

## 2023-09-04 DIAGNOSIS — R2689 Other abnormalities of gait and mobility: Secondary | ICD-10-CM | POA: Diagnosis not present

## 2023-09-04 DIAGNOSIS — L89153 Pressure ulcer of sacral region, stage 3: Secondary | ICD-10-CM | POA: Diagnosis not present

## 2023-09-04 DIAGNOSIS — M6281 Muscle weakness (generalized): Secondary | ICD-10-CM | POA: Diagnosis not present

## 2023-09-04 DIAGNOSIS — I10 Essential (primary) hypertension: Secondary | ICD-10-CM | POA: Diagnosis not present

## 2023-09-04 DIAGNOSIS — E785 Hyperlipidemia, unspecified: Secondary | ICD-10-CM | POA: Diagnosis not present

## 2023-09-04 DIAGNOSIS — R570 Cardiogenic shock: Secondary | ICD-10-CM | POA: Diagnosis not present

## 2023-09-04 DIAGNOSIS — Z741 Need for assistance with personal care: Secondary | ICD-10-CM | POA: Diagnosis not present

## 2023-09-04 DIAGNOSIS — Z7409 Other reduced mobility: Secondary | ICD-10-CM | POA: Diagnosis not present

## 2023-09-05 DIAGNOSIS — R2689 Other abnormalities of gait and mobility: Secondary | ICD-10-CM | POA: Diagnosis not present

## 2023-09-05 DIAGNOSIS — M6281 Muscle weakness (generalized): Secondary | ICD-10-CM | POA: Diagnosis not present

## 2023-09-05 DIAGNOSIS — I214 Non-ST elevation (NSTEMI) myocardial infarction: Secondary | ICD-10-CM | POA: Diagnosis not present

## 2023-09-07 DIAGNOSIS — L89153 Pressure ulcer of sacral region, stage 3: Secondary | ICD-10-CM | POA: Diagnosis not present

## 2023-09-07 DIAGNOSIS — Z7409 Other reduced mobility: Secondary | ICD-10-CM | POA: Diagnosis not present

## 2023-09-07 DIAGNOSIS — M6281 Muscle weakness (generalized): Secondary | ICD-10-CM | POA: Diagnosis not present

## 2023-09-07 DIAGNOSIS — Z741 Need for assistance with personal care: Secondary | ICD-10-CM | POA: Diagnosis not present

## 2023-09-08 DIAGNOSIS — M6281 Muscle weakness (generalized): Secondary | ICD-10-CM | POA: Diagnosis not present

## 2023-09-08 DIAGNOSIS — I214 Non-ST elevation (NSTEMI) myocardial infarction: Secondary | ICD-10-CM | POA: Diagnosis not present

## 2023-09-08 DIAGNOSIS — R2689 Other abnormalities of gait and mobility: Secondary | ICD-10-CM | POA: Diagnosis not present

## 2023-09-09 DIAGNOSIS — L89153 Pressure ulcer of sacral region, stage 3: Secondary | ICD-10-CM | POA: Diagnosis not present

## 2023-09-10 ENCOUNTER — Ambulatory Visit (HOSPITAL_COMMUNITY)

## 2023-09-11 DIAGNOSIS — Z515 Encounter for palliative care: Secondary | ICD-10-CM | POA: Diagnosis not present

## 2023-09-13 DIAGNOSIS — L89153 Pressure ulcer of sacral region, stage 3: Secondary | ICD-10-CM | POA: Diagnosis not present

## 2023-09-13 DIAGNOSIS — E785 Hyperlipidemia, unspecified: Secondary | ICD-10-CM | POA: Diagnosis not present

## 2023-09-13 DIAGNOSIS — I4891 Unspecified atrial fibrillation: Secondary | ICD-10-CM | POA: Diagnosis not present

## 2023-09-13 DIAGNOSIS — I509 Heart failure, unspecified: Secondary | ICD-10-CM | POA: Diagnosis not present

## 2023-09-13 DIAGNOSIS — M6281 Muscle weakness (generalized): Secondary | ICD-10-CM | POA: Diagnosis not present

## 2023-09-13 DIAGNOSIS — Z7409 Other reduced mobility: Secondary | ICD-10-CM | POA: Diagnosis not present

## 2023-09-13 DIAGNOSIS — I10 Essential (primary) hypertension: Secondary | ICD-10-CM | POA: Diagnosis not present

## 2023-09-15 DIAGNOSIS — E785 Hyperlipidemia, unspecified: Secondary | ICD-10-CM | POA: Diagnosis not present

## 2023-09-15 DIAGNOSIS — M6281 Muscle weakness (generalized): Secondary | ICD-10-CM | POA: Diagnosis not present

## 2023-09-15 DIAGNOSIS — L98429 Non-pressure chronic ulcer of back with unspecified severity: Secondary | ICD-10-CM | POA: Diagnosis not present

## 2023-09-15 DIAGNOSIS — Z7409 Other reduced mobility: Secondary | ICD-10-CM | POA: Diagnosis not present

## 2023-09-15 DIAGNOSIS — L89153 Pressure ulcer of sacral region, stage 3: Secondary | ICD-10-CM | POA: Diagnosis not present

## 2023-09-15 DIAGNOSIS — I10 Essential (primary) hypertension: Secondary | ICD-10-CM | POA: Diagnosis not present

## 2023-09-15 DIAGNOSIS — K30 Functional dyspepsia: Secondary | ICD-10-CM | POA: Diagnosis not present

## 2023-09-15 DIAGNOSIS — D649 Anemia, unspecified: Secondary | ICD-10-CM | POA: Diagnosis not present

## 2023-09-16 ENCOUNTER — Ambulatory Visit: Admitting: Cardiology

## 2023-09-18 DIAGNOSIS — L98429 Non-pressure chronic ulcer of back with unspecified severity: Secondary | ICD-10-CM | POA: Diagnosis not present

## 2023-09-18 DIAGNOSIS — M6281 Muscle weakness (generalized): Secondary | ICD-10-CM | POA: Diagnosis not present

## 2023-09-18 DIAGNOSIS — Z7409 Other reduced mobility: Secondary | ICD-10-CM | POA: Diagnosis not present

## 2023-09-18 DIAGNOSIS — Z741 Need for assistance with personal care: Secondary | ICD-10-CM | POA: Diagnosis not present

## 2023-09-21 DIAGNOSIS — Z741 Need for assistance with personal care: Secondary | ICD-10-CM | POA: Diagnosis not present

## 2023-09-21 DIAGNOSIS — K219 Gastro-esophageal reflux disease without esophagitis: Secondary | ICD-10-CM | POA: Diagnosis not present

## 2023-09-21 DIAGNOSIS — M6281 Muscle weakness (generalized): Secondary | ICD-10-CM | POA: Diagnosis not present

## 2023-09-21 DIAGNOSIS — Z7409 Other reduced mobility: Secondary | ICD-10-CM | POA: Diagnosis not present

## 2023-09-24 DIAGNOSIS — L98429 Non-pressure chronic ulcer of back with unspecified severity: Secondary | ICD-10-CM | POA: Diagnosis not present

## 2023-09-24 DIAGNOSIS — M6281 Muscle weakness (generalized): Secondary | ICD-10-CM | POA: Diagnosis not present

## 2023-09-24 DIAGNOSIS — Z7409 Other reduced mobility: Secondary | ICD-10-CM | POA: Diagnosis not present

## 2023-09-25 DIAGNOSIS — R570 Cardiogenic shock: Secondary | ICD-10-CM | POA: Diagnosis not present

## 2023-09-25 DIAGNOSIS — E785 Hyperlipidemia, unspecified: Secondary | ICD-10-CM | POA: Diagnosis not present

## 2023-09-25 DIAGNOSIS — L89153 Pressure ulcer of sacral region, stage 3: Secondary | ICD-10-CM | POA: Diagnosis not present

## 2023-09-25 DIAGNOSIS — J9601 Acute respiratory failure with hypoxia: Secondary | ICD-10-CM | POA: Diagnosis not present

## 2023-09-25 DIAGNOSIS — D649 Anemia, unspecified: Secondary | ICD-10-CM | POA: Diagnosis not present

## 2023-09-25 DIAGNOSIS — I10 Essential (primary) hypertension: Secondary | ICD-10-CM | POA: Diagnosis not present

## 2023-09-25 DIAGNOSIS — R2689 Other abnormalities of gait and mobility: Secondary | ICD-10-CM | POA: Diagnosis not present

## 2023-09-25 DIAGNOSIS — I214 Non-ST elevation (NSTEMI) myocardial infarction: Secondary | ICD-10-CM | POA: Diagnosis not present

## 2023-09-29 ENCOUNTER — Ambulatory Visit: Admitting: Podiatry

## 2023-09-29 DIAGNOSIS — L89153 Pressure ulcer of sacral region, stage 3: Secondary | ICD-10-CM | POA: Diagnosis not present

## 2023-09-30 DIAGNOSIS — I10 Essential (primary) hypertension: Secondary | ICD-10-CM | POA: Diagnosis not present

## 2023-09-30 DIAGNOSIS — E785 Hyperlipidemia, unspecified: Secondary | ICD-10-CM | POA: Diagnosis not present

## 2023-09-30 DIAGNOSIS — D649 Anemia, unspecified: Secondary | ICD-10-CM | POA: Diagnosis not present

## 2023-09-30 DIAGNOSIS — L89153 Pressure ulcer of sacral region, stage 3: Secondary | ICD-10-CM | POA: Diagnosis not present

## 2023-10-01 DIAGNOSIS — Z7409 Other reduced mobility: Secondary | ICD-10-CM | POA: Diagnosis not present

## 2023-10-01 DIAGNOSIS — L89159 Pressure ulcer of sacral region, unspecified stage: Secondary | ICD-10-CM | POA: Diagnosis not present

## 2023-10-01 DIAGNOSIS — K219 Gastro-esophageal reflux disease without esophagitis: Secondary | ICD-10-CM | POA: Diagnosis not present

## 2023-10-01 DIAGNOSIS — Z741 Need for assistance with personal care: Secondary | ICD-10-CM | POA: Diagnosis not present

## 2023-10-01 DIAGNOSIS — M6281 Muscle weakness (generalized): Secondary | ICD-10-CM | POA: Diagnosis not present

## 2023-10-06 DIAGNOSIS — M6281 Muscle weakness (generalized): Secondary | ICD-10-CM | POA: Diagnosis not present

## 2023-10-06 DIAGNOSIS — K219 Gastro-esophageal reflux disease without esophagitis: Secondary | ICD-10-CM | POA: Diagnosis not present

## 2023-10-06 DIAGNOSIS — Z741 Need for assistance with personal care: Secondary | ICD-10-CM | POA: Diagnosis not present

## 2023-10-06 DIAGNOSIS — Z7409 Other reduced mobility: Secondary | ICD-10-CM | POA: Diagnosis not present

## 2023-10-07 DIAGNOSIS — I10 Essential (primary) hypertension: Secondary | ICD-10-CM | POA: Diagnosis not present

## 2023-10-07 DIAGNOSIS — D649 Anemia, unspecified: Secondary | ICD-10-CM | POA: Diagnosis not present

## 2023-10-07 DIAGNOSIS — L89153 Pressure ulcer of sacral region, stage 3: Secondary | ICD-10-CM | POA: Diagnosis not present

## 2023-10-07 DIAGNOSIS — E785 Hyperlipidemia, unspecified: Secondary | ICD-10-CM | POA: Diagnosis not present

## 2023-10-08 DIAGNOSIS — I509 Heart failure, unspecified: Secondary | ICD-10-CM | POA: Diagnosis not present

## 2023-10-08 DIAGNOSIS — I4891 Unspecified atrial fibrillation: Secondary | ICD-10-CM | POA: Diagnosis not present

## 2023-10-08 DIAGNOSIS — M6281 Muscle weakness (generalized): Secondary | ICD-10-CM | POA: Diagnosis not present

## 2023-10-08 DIAGNOSIS — I2510 Atherosclerotic heart disease of native coronary artery without angina pectoris: Secondary | ICD-10-CM | POA: Diagnosis not present

## 2023-10-08 DIAGNOSIS — E785 Hyperlipidemia, unspecified: Secondary | ICD-10-CM | POA: Diagnosis not present

## 2023-10-08 DIAGNOSIS — Z7409 Other reduced mobility: Secondary | ICD-10-CM | POA: Diagnosis not present

## 2023-10-08 DIAGNOSIS — I10 Essential (primary) hypertension: Secondary | ICD-10-CM | POA: Diagnosis not present

## 2023-10-10 DIAGNOSIS — Z7409 Other reduced mobility: Secondary | ICD-10-CM | POA: Diagnosis not present

## 2023-10-10 DIAGNOSIS — M6281 Muscle weakness (generalized): Secondary | ICD-10-CM | POA: Diagnosis not present

## 2023-10-10 DIAGNOSIS — I4891 Unspecified atrial fibrillation: Secondary | ICD-10-CM | POA: Diagnosis not present

## 2023-10-10 DIAGNOSIS — K219 Gastro-esophageal reflux disease without esophagitis: Secondary | ICD-10-CM | POA: Diagnosis not present

## 2023-10-13 ENCOUNTER — Ambulatory Visit (HOSPITAL_COMMUNITY)
Admission: RE | Admit: 2023-10-13 | Discharge: 2023-10-13 | Disposition: A | Discharge status: Home / Self Care | Source: Ambulatory Visit | Attending: Internal Medicine | Admitting: Internal Medicine

## 2023-10-13 ENCOUNTER — Ambulatory Visit: Payer: Self-pay | Admitting: Internal Medicine

## 2023-10-13 DIAGNOSIS — I5021 Acute systolic (congestive) heart failure: Secondary | ICD-10-CM | POA: Diagnosis not present

## 2023-10-13 DIAGNOSIS — I214 Non-ST elevation (NSTEMI) myocardial infarction: Secondary | ICD-10-CM

## 2023-10-13 DIAGNOSIS — M6281 Muscle weakness (generalized): Secondary | ICD-10-CM | POA: Diagnosis not present

## 2023-10-13 DIAGNOSIS — I480 Paroxysmal atrial fibrillation: Secondary | ICD-10-CM | POA: Diagnosis not present

## 2023-10-13 DIAGNOSIS — Z7409 Other reduced mobility: Secondary | ICD-10-CM | POA: Diagnosis not present

## 2023-10-13 DIAGNOSIS — Z515 Encounter for palliative care: Secondary | ICD-10-CM | POA: Diagnosis not present

## 2023-10-13 LAB — ECHOCARDIOGRAM LIMITED
AR max vel: 1.75 cm2
AV Area VTI: 1.79 cm2
AV Area mean vel: 1.73 cm2
AV Mean grad: 2 mmHg
AV Peak grad: 4.7 mmHg
Ao pk vel: 1.08 m/s
Area-P 1/2: 3.6 cm2
Calc EF: 49.6 %
P 1/2 time: 934 ms
S' Lateral: 2.03 cm
Single Plane A2C EF: 49.8 %
Single Plane A4C EF: 56.4 %

## 2023-10-14 DIAGNOSIS — I10 Essential (primary) hypertension: Secondary | ICD-10-CM | POA: Diagnosis not present

## 2023-10-14 DIAGNOSIS — D649 Anemia, unspecified: Secondary | ICD-10-CM | POA: Diagnosis not present

## 2023-10-14 DIAGNOSIS — L89153 Pressure ulcer of sacral region, stage 3: Secondary | ICD-10-CM | POA: Diagnosis not present

## 2023-10-14 DIAGNOSIS — E785 Hyperlipidemia, unspecified: Secondary | ICD-10-CM | POA: Diagnosis not present

## 2023-10-18 DIAGNOSIS — R5381 Other malaise: Secondary | ICD-10-CM | POA: Diagnosis not present

## 2023-10-18 DIAGNOSIS — M6281 Muscle weakness (generalized): Secondary | ICD-10-CM | POA: Diagnosis not present

## 2023-10-18 DIAGNOSIS — Z741 Need for assistance with personal care: Secondary | ICD-10-CM | POA: Diagnosis not present

## 2023-10-18 DIAGNOSIS — Z7409 Other reduced mobility: Secondary | ICD-10-CM | POA: Diagnosis not present

## 2023-10-18 DIAGNOSIS — K219 Gastro-esophageal reflux disease without esophagitis: Secondary | ICD-10-CM | POA: Diagnosis not present

## 2023-10-22 DIAGNOSIS — R570 Cardiogenic shock: Secondary | ICD-10-CM | POA: Diagnosis not present

## 2023-10-22 DIAGNOSIS — I214 Non-ST elevation (NSTEMI) myocardial infarction: Secondary | ICD-10-CM | POA: Diagnosis not present

## 2023-10-22 DIAGNOSIS — J9601 Acute respiratory failure with hypoxia: Secondary | ICD-10-CM | POA: Diagnosis not present

## 2023-10-22 DIAGNOSIS — R2689 Other abnormalities of gait and mobility: Secondary | ICD-10-CM | POA: Diagnosis not present

## 2023-10-22 NOTE — Progress Notes (Addendum)
 Cardiology Office Note    Date:  10/23/2023  ID:  Naomie May Tristan, DOB 24-Nov-1940, MRN [id number] PCP:  Duanne Butler DASEN, MD  Cardiologist:  Lonni LITTIE Nanas, MD  Electrophysiologist:  None   Chief Complaint: Follow up for HFrEF and atrial fibrillation   History of Present Illness: .    Lerlene May Kassem is a 83 y.o. female with visit-pertinent history of hypertension, hyperlipidemia, PVD, HFrEF with recovered EF, PAF, CAD noted on CT scan.   Patient with history of PVD, PV angiogram in 08/2016 indicated critical limb ischemia with occluded left SFA, popliteal and tibial arteries.  No endovascular or surgical revascularization options at that time.  She was seen in office by Dr. Court on 09/10/2016 for gangrenous changes of her left toes, she was referred to Dr. Zachary Clause for revascularization of the limb.  She was then lost to follow-up.  On 06/27/2023 patient presented to ED for shortness of breath and was found to be in respiratory distress with oxygen saturation 70s.  Patient improved to low 80s with nonrebreather.  ED exam revealed JVD, diffuse crackles, diaphoresis, tachycardia and lower extremity edema.  Patient was started on BiPAP with improvement but this was discontinued due to vomiting.  Patient was back to 100% been successfully intubated with diffuse frothy secretions.  Patient's high sensitive troponin 636>> 881>>1135, BNP 850, lactic acid 2.7.  Chest x-ray showed wide spread bilateral airspace consolidations, consistent with bilateral peripheral regions most likely edema versus multifocal pneumonia.  CTA showed extensive coronary artery atherosclerosis, small bilateral pleural effusions, EKG indicated sinus tachycardia at 105 bpm, LVH with repolarization abnormalities.  Echo on 06/27/2023 indicated LVEF of 35 to 40%, global hypokinesis, mild LVH of the basal septal segment, diastolic function could not be evaluated, RV systolic function and size was normal, small  pericardial effusion was present moderate mitral valve regurgitation with no evidence of stenosis, aortic valve sclerosis was present without evidence of stenosis and mild aortic valve regurgitation.  Patient was noted to be anemic and is a Jehovah's witness, as such patient's elevations and troponin was elected to manage conservatively.  Patient's GDMT was escalated.  It was noted that patient had possible atrial fibrillation while inpatient, she was loaded on amiodarone , OAC deferred given brief nature of A-fib and low hemoglobin.  Repeat echo on 10/13/2023 indicated LVEF of 55 to 60%, no RWMA, mild concentric LVH and moderate basal septal hypertrophy, G1 DD, RV systolic function and size was normal, normal pulmonary artery systolic pressures, mild mitral valve regurgitation with no evidence of stenosis no evidence of aortic valve stenosis, mild regurgitation.  Today she presents for follow-up.  She reports that she is doing well overall.  She denies any chest pain, shortness of breath, lower extremity edema, orthopnea or PND.  Patient denies any bleeding problems on aspirin  and Plavix .  Patient notes that her biggest complaints is regarding the mealtimes at her facility, notes that she typically will have to eat later at night which results in acid reflux when she lays down, she reports having a sour taste in her mouth and a burning sensation in her mouth that resolves with eating of ice.  Patient denies any chest pain or shortness of breath on exertion.  She denies any palpitations or feeling of atrial fibrillation.  ROS: .   Today she denies chest pain, shortness of breath, lower extremity edema, fatigue, palpitations, melena, hematuria, hemoptysis, diaphoresis, weakness, presyncope, syncope, orthopnea, and PND.  All other systems are reviewed and  otherwise negative. Studies Reviewed: SABRA   EKG:  EKG is ordered today, personally reviewed, demonstrating  EKG Interpretation Date/Time:  Thursday October 23 2023 08:02:18 EDT Ventricular Rate:  62 PR Interval:  158 QRS Duration:  84 QT Interval:  440 QTC Calculation: 446 R Axis:   22  Text Interpretation: Normal sinus rhythm Nonspecific ST and T wave abnormality When compared with ECG of 30-Jun-2023 00:37, Sinus rhythm has replaced Atrial fibrillation Vent. rate has decreased BY  91 BPM Borderline criteria for Anterior infarct are no longer Present Confirmed by Ivory Bail [phone]) on 10/23/2023 12:53:45 PM   CV Studies: Cardiac studies reviewed are outlined and summarized above. Otherwise please see EMR for full report. Cardiac Studies & Procedures   ______________________________________________________________________________________________     ECHOCARDIOGRAM  ECHOCARDIOGRAM LIMITED 10/13/2023  Narrative ECHOCARDIOGRAM LIMITED REPORT    Patient Name:   ANNISA MAY Tien Date of Exam: 10/13/2023 Medical Rec #:  [id number]          Height:       62.0 in Accession #:    [id number]         Weight:       105.4 lb Date of Birth:  07-18-1940          BSA:          1.456 m Patient Age:    83 years           BP:           125/84 mmHg Patient Gender: F                  HR:           77 bpm. Exam Location:  [address]  Procedure: Limited Echo, Cardiac Doppler and Color Doppler (Both Spectral and Color Flow Doppler were utilized during procedure).  Indications:     NSTEMI (non-ST elevated myocardial infarction) (HCC) [I21.4 (ICD-10-CM)]; Acute systolic heart failure (HCC) [I50.21 (ICD-10-CM)]  History:         Patient has prior history of Echocardiogram examinations, most recent 06/27/2023. PAD; Risk Factors:Hypertension and Non-Smoker.  Sonographer:     Rosaline Fujisawa MHA, RDMS, RVT, RDCS Referring Phys:  [id number] SOYLA DELENA MERCK Diagnosing Phys: Wilbert Bihari MD   Sonographer Comments: Image acquisition challenging due to patient body habitus and restricted mobility. IMPRESSIONS   1. Left ventricular ejection fraction,  by estimation, is 55 to 60%. The left ventricle has normal function. The left ventricle has no regional wall motion abnormalities. There is mild concentric left ventricular hypertrophy and moderate basal septal hypertrophy. Left ventricular diastolic parameters are consistent with Grade I diastolic dysfunction (impaired relaxation). Elevated left atrial pressure. 2. Right ventricular systolic function is normal. The right ventricular size is normal. There is normal pulmonary artery systolic pressure. The estimated right ventricular systolic pressure is 27.6 mmHg. 3. The mitral valve is degenerative. Mild mitral valve regurgitation. No evidence of mitral stenosis. 4. The aortic valve is normal in structure. Aortic valve regurgitation is mild. No aortic stenosis is present. Aortic regurgitation PHT measures 934 msec. Aortic valve area, by VTI measures 1.79 cm. Aortic valve mean gradient measures 2.0 mmHg. Aortic valve Vmax measures 1.08 m/s. 5. The inferior vena cava is normal in size with greater than 50% respiratory variability, suggesting right atrial pressure of 3 mmHg.  FINDINGS Left Ventricle: Left ventricular ejection fraction, by estimation, is 55 to 60%. The left ventricle has normal function. The left ventricle has no regional wall motion abnormalities.  The left ventricular internal cavity size was normal in size. There is mild concentric left ventricular hypertrophy. Left ventricular diastolic parameters are consistent with Grade I diastolic dysfunction (impaired relaxation). Elevated left atrial pressure.  Right Ventricle: The right ventricular size is normal. No increase in right ventricular wall thickness. Right ventricular systolic function is normal. There is normal pulmonary artery systolic pressure. The tricuspid regurgitant velocity is 2.48 m/s, and with an assumed right atrial pressure of 3 mmHg, the estimated right ventricular systolic pressure is 27.6 mmHg.  Left Atrium: Left  atrial size was normal in size.  Right Atrium: Right atrial size was normal in size.  Pericardium: There is no evidence of pericardial effusion.  Mitral Valve: The mitral valve is degenerative in appearance. There is mild thickening of the mitral valve leaflet(s). There is mild calcification of the mitral valve leaflet(s). Mild mitral annular calcification. Mild mitral valve regurgitation. No evidence of mitral valve stenosis.  Tricuspid Valve: The tricuspid valve is normal in structure. Tricuspid valve regurgitation is mild . No evidence of tricuspid stenosis.  Aortic Valve: The aortic valve is normal in structure. Aortic valve regurgitation is mild. Aortic regurgitation PHT measures 934 msec. No aortic stenosis is present. Aortic valve mean gradient measures 2.0 mmHg. Aortic valve peak gradient measures 4.7 mmHg. Aortic valve area, by VTI measures 1.79 cm.  Pulmonic Valve: The pulmonic valve was normal in structure. Pulmonic valve regurgitation is not visualized. No evidence of pulmonic stenosis.  Aorta: The aortic root is normal in size and structure.  Venous: The inferior vena cava is normal in size with greater than 50% respiratory variability, suggesting right atrial pressure of 3 mmHg.  IAS/Shunts: No atrial level shunt detected by color flow Doppler.  LEFT VENTRICLE PLAX 2D LVIDd:         2.85 cm     Diastology LVIDs:         2.03 cm     LV e' medial:    3.48 cm/s LV PW:         1.38 cm     LV E/e' medial:  25.9 LV IVS:        1.30 cm     LV e' lateral:   5.87 cm/s LVOT diam:     1.65 cm     LV E/e' lateral: 15.3 LV SV:         38 LV SV Index:   26 LVOT Area:     2.14 cm  LV Volumes (MOD) LV vol d, MOD A2C: 27.3 ml LV vol d, MOD A4C: 56.0 ml LV vol s, MOD A2C: 13.7 ml LV vol s, MOD A4C: 24.4 ml LV SV MOD A2C:     13.6 ml LV SV MOD A4C:     56.0 ml LV SV MOD BP:      19.4 ml  LEFT ATRIUM             Index LA diam:        2.63 cm 1.81 cm/m LA Vol (A2C):   18.8 ml  12.91 ml/m LA Vol (A4C):   60.0 ml 41.21 ml/m LA Biplane Vol: 35.8 ml 24.59 ml/m AORTIC VALVE AV Area (Vmax):    1.75 cm AV Area (Vmean):   1.73 cm AV Area (VTI):     1.79 cm AV Vmax:           108.00 cm/s AV Vmean:          72.000 cm/s AV VTI:  0.212 m AV Peak Grad:      4.7 mmHg AV Mean Grad:      2.0 mmHg LVOT Vmax:         88.60 cm/s LVOT Vmean:        58.200 cm/s LVOT VTI:          0.177 m LVOT/AV VTI ratio: 0.83 AI PHT:            934 msec  MITRAL VALVE                TRICUSPID VALVE MV Area (PHT): 3.60 cm     TR Peak grad:   24.6 mmHg MV Decel Time: 211 msec     TR Vmax:        248.00 cm/s MV E velocity: 90.10 cm/s MV A velocity: 126.00 cm/s  SHUNTS MV E/A ratio:  0.72         Systemic VTI:  0.18 m Systemic Diam: 1.65 cm  Wilbert Bihari MD Electronically signed by Wilbert Bihari MD Signature Date/Time: 10/13/2023/9:00:35 AM    Final (Updated)          ______________________________________________________________________________________________       Current Reported Medications:.    Current Meds  Medication Sig   amiodarone  (PACERONE ) 200 MG tablet Take 1 tablet (200 mg total) by mouth daily.   aspirin  325 MG tablet Take 325 mg by mouth daily.   atorvastatin (LIPITOR) 40 MG tablet Take 40 mg by mouth daily.   Cholecalciferol (VITAMIN D-3 PO) Take 1 capsule by mouth daily.   clopidogrel  (PLAVIX ) 75 MG tablet Take 1 tablet (75 mg total) by mouth daily.   Coenzyme Q10 (COQ10 PO) Take 1 capsule by mouth daily.   Cyanocobalamin  (VITAMIN B-12 PO) Take 1 tablet by mouth daily.   Ferrous Sulfate (IRON PO) Take 1 tablet by mouth daily.   furosemide  (LASIX ) 40 MG tablet Take 1 tablet (40 mg total) by mouth daily.   MAGNESIUM  PO Take 1 tablet by mouth daily.   metoprolol  succinate (TOPROL -XL) 25 MG 24 hr tablet Take 0.5 tablets (12.5 mg total) by mouth daily.   nitroGLYCERIN  (NITROSTAT ) 0.4 MG SL tablet Place 1 tablet (0.4 mg total) under the tongue  every 5 (five) minutes as needed for chest pain.    Physical Exam:    VS:  BP 122/78   Pulse 62   Wt 116 lb 9.6 oz (52.9 kg)   SpO2 99%   BMI 21.33 kg/m    Wt Readings from Last 3 Encounters:  10/23/23 116 lb 9.6 oz (52.9 kg)  07/07/23 105 lb 6.1 oz (47.8 kg)  03/04/23 118 lb 6.4 oz (53.7 kg)    GEN: Well nourished, well developed in no acute distress NECK: No JVD; No carotid bruits CARDIAC: RRR, no murmurs, rubs, gallops RESPIRATORY:  Clear to auscultation without rales, wheezing or rhonchi  ABDOMEN: Soft, non-tender, non-distended EXTREMITIES:  No edema; No acute deformity     Asessement and Plan:.    CAD: Patient noted to have type II myocardial infarction with troponin increased to 1100 in setting of acute illness in 07/2023.  CTA indicated extensive coronary artery atherosclerosis.  Ischemic evaluation was not completed at that time as patient was noted to have chronic anemia and was a Jehovah's Witness.  Patient started on aspirin , Plavix  and Crestor  20 mg daily. Today she reports that she is doing well, denies any chest pain or shortness of breath.  Today we had a long discussion regarding ischemic evaluation,  patient reports that she is not interested unless she starts to have concerning symptoms.  Patient denies any chest pain or shortness of breath, as such she is deferred any ischemic evaluation at this time.  Patient will notify the office if she starts to have any chest discomfort or shortness of breath.  Reviewed ED precautions.  Continue aspirin , Plavix  and atorvastatin.  HF: Echo during admission indicated EF 35 to 40% in setting of hypoxic respiratory failure.  Patient was discharged on GDMT consisting of metoprolol  and spironolactone .  Jardiance  was trialed however resulted in hypoglycemia, blood pressure was too low for ARB.  Repeat echo on 10/13/2023 indicated LVEF of 55 to 60%, no RWMA, mild concentric LVH and moderate basal septal hypertrophy, G1 DD, RV systolic  function and size was normal, normal pulmonary artery systolic pressures, mild mitral valve regurgitation with no evidence of stenosis no evidence of aortic valve stenosis, mild regurgitation. Today she appears euvolemic and well compensated on exam.  She denies any shortness of breath, lower extremity edema, orthopnea or PND.  Per notes from her facility spironolactone  has been discontinued, patient believes that she had low blood pressures with this.  Patient has continued on furosemide  40 mg daily and metoprolol  succinate 12.5 mg daily, tolerating well per patient.  Given previous hypotension will not escalate GDMT today.  Encouraged patient to monitor for increased lower extremity edema, weight gain of 2 to 3 pounds overnight or 5 pounds in a week.  She will notify the office if present. Check CBC and CMET today.   PAD: Patient with history of critical limb ischemia and gangrene.  Noted to have occluded left SFA, popliteal and tibial arteries.  No revascularization options available during angiography in 2018, patient denies any current lower extremity pain or wounds.  Atrial fibrillation: While inpatient in setting of acute illness noted to have an episode of atrial fibrillation with RVR, was loaded on amiodarone  with plan to continue short-term.  Patient was not started on anticoagulation given history of anemia and being a Jehovah's Witness, again discussed anticoagulation, patient in agreement with not starting on anticoagulation, discussed Watchman device, she reports that she is not interested at this time.  Patient agreeable to wearing a 2-week cardiac monitor to assess for possible atrial fibrillation burden, if not present could consider discontinuing amiodarone , will reach out to Dr. Kate for recommendations. Check CBC, CMET and TSH today.   Hyperlipidemia: LDL goal less than 55.  Patient was started on Crestor  20 mg daily during recent hospital admission.  On review from facility patient has  been taking atorvastatin 40 mg daily, med list updated.  Check fasting lipid profile and LFTs.    ADDENDUM 10/26/23: Reviewed with Dr. Kate, if no evidence of atrial fibrillation on cardiac monitor will plan to stop amiodarone .   Disposition: F/u with Dr. Kate or Giankarlo Leamer, NP in three months or sooner if needed per patient request.   Signed, Amine Adelson D Milaina Sher, NP

## 2023-10-23 ENCOUNTER — Ambulatory Visit

## 2023-10-23 ENCOUNTER — Encounter: Payer: Self-pay | Admitting: Cardiology

## 2023-10-23 ENCOUNTER — Ambulatory Visit: Attending: Cardiology | Admitting: Cardiology

## 2023-10-23 VITALS — BP 122/78 | HR 62 | Wt 116.6 lb

## 2023-10-23 DIAGNOSIS — I5020 Unspecified systolic (congestive) heart failure: Secondary | ICD-10-CM

## 2023-10-23 DIAGNOSIS — E782 Mixed hyperlipidemia: Secondary | ICD-10-CM

## 2023-10-23 DIAGNOSIS — D649 Anemia, unspecified: Secondary | ICD-10-CM | POA: Diagnosis not present

## 2023-10-23 DIAGNOSIS — I480 Paroxysmal atrial fibrillation: Secondary | ICD-10-CM

## 2023-10-23 DIAGNOSIS — I739 Peripheral vascular disease, unspecified: Secondary | ICD-10-CM

## 2023-10-23 DIAGNOSIS — I252 Old myocardial infarction: Secondary | ICD-10-CM | POA: Diagnosis not present

## 2023-10-23 DIAGNOSIS — I2510 Atherosclerotic heart disease of native coronary artery without angina pectoris: Secondary | ICD-10-CM | POA: Diagnosis not present

## 2023-10-23 NOTE — Patient Instructions (Signed)
 Medication Instructions:  No changes *If you need a refill on your cardiac medications before your next appointment, please call your pharmacy*  Lab Work: Today we are going to have you stop on the first floor for CBC, Cmet, TSH, and Lipid profile  If you have labs (blood work) drawn today and your tests are completely normal, you will receive your results only by: MyChart Message (if you have MyChart) OR A paper copy in the mail If you have any lab test that is abnormal or we need to change your treatment, we will call you to review the results.  Testing/Procedures: Look at next page  Follow-Up: At [facility] you and your health needs are our priority.  As part of our continuing mission to provide you with exceptional heart care, our providers are all part of one team.  This team includes your primary Cardiologist (physician) and Advanced Practice Providers or APPs (Physician Assistants and Nurse Practitioners) who all work together to provide you with the care you need, when you need it.  Your next appointment:   3 month(s)  Provider:   Lonni LITTIE Nanas, MD or Katlyn West, NP     We recommend signing up for the patient portal called MyChart.  Sign up information is provided on this After Visit Summary.  MyChart is used to connect with patients for Virtual Visits (Telemedicine).  Patients are able to view lab/test results, encounter notes, upcoming appointments, etc.  Non-urgent messages can be sent to your provider as well.   To learn more about what you can do with MyChart, go to [email or web address].   Other Instructions ZIO XT- Long Term Monitor Instructions  Your physician has requested you wear a ZIO patch monitor for 14 days.  This is a single patch monitor. [employer] supplies one patch monitor per enrollment. Additional stickers are not available. Please do not apply patch if you will be having a Nuclear Stress Test,  Echocardiogram, Cardiac CT, MRI, or  Chest Xray during the period you would be wearing the  monitor. The patch cannot be worn during these tests. You cannot remove and re-apply the  ZIO XT patch monitor.  Your ZIO patch monitor will be mailed 3 day USPS to your address on file. It may take 3-5 days  to receive your monitor after you have been enrolled.  Once you have received your monitor, please review the enclosed instructions. Your monitor  has already been registered assigning a specific monitor serial # to you.  Billing and Patient Assistance Program Information  We have supplied [employer] with any of your insurance information on file for billing purposes. [employer] offers a sliding scale Patient Assistance Program for patients that do not have  insurance, or whose insurance does not completely cover the cost of the ZIO monitor.  You must apply for the Patient Assistance Program to qualify for this discounted rate.  To apply, please call [employer] at [phone], select option 4, select option 2, ask to apply for  Patient Assistance Program. Meredeth will ask your household income, and how many people  are in your household. They will quote your out-of-pocket cost based on that information.  [employer] will also be able to set up a 77-month, interest-free payment plan if needed.  Applying the monitor   Shave hair from upper left chest.  Hold abrader disc by orange tab. Rub abrader in 40 strokes over the upper left chest as  indicated in your monitor instructions.  Clean area with 4  enclosed alcohol pads. Let dry.  Apply patch as indicated in monitor instructions. Patch will be placed under collarbone on left  side of chest with arrow pointing upward.  Rub patch adhesive wings for 2 minutes. Remove white label marked 1. Remove the white  label marked 2. Rub patch adhesive wings for 2 additional minutes.  While looking in a mirror, press and release button in center of patch. A small green light will  flash 3-4 times. This  will be your only indicator that the monitor has been turned on.  Do not shower for the first 24 hours. You may shower after the first 24 hours.  Press the button if you feel a symptom. You will hear a small click. Record Date, Time and  Symptom in the Patient Logbook.  When you are ready to remove the patch, follow instructions on the last 2 pages of Patient  Logbook. Stick patch monitor onto the last page of Patient Logbook.  Place Patient Logbook in the blue and white box. Use locking tab on box and tape box closed  securely. The blue and white box has prepaid postage on it. Please place it in the mailbox as  soon as possible. Your physician should have your test results approximately 7 days after the  monitor has been mailed back to [facility].  Call [facility] Customer Care at [phone] if you have questions regarding  your ZIO XT patch monitor. Call them immediately if you see an orange light blinking on your  monitor.  If your monitor falls off in less than 4 days, contact our Monitor department at [phone].  If your monitor becomes loose or falls off after 4 days call [employer] at [phone] for  suggestions on securing your monitor

## 2023-10-24 DIAGNOSIS — M6281 Muscle weakness (generalized): Secondary | ICD-10-CM | POA: Diagnosis not present

## 2023-10-24 DIAGNOSIS — Z741 Need for assistance with personal care: Secondary | ICD-10-CM | POA: Diagnosis not present

## 2023-10-24 LAB — CBC
Hematocrit: 40.6 % (ref 34.0–46.6)
Hemoglobin: 12.7 g/dL (ref 11.1–15.9)
MCH: 30 pg (ref 26.6–33.0)
MCHC: 31.3 g/dL — ABNORMAL LOW (ref 31.5–35.7)
MCV: 96 fL (ref 79–97)
NRBC: 6 % — ABNORMAL HIGH (ref 0–0)
Platelets: 502 x10E3/uL — ABNORMAL HIGH (ref 150–450)
RBC: 4.24 x10E6/uL (ref 3.77–5.28)
RDW: 17 % — ABNORMAL HIGH (ref 11.7–15.4)
WBC: 10 x10E3/uL (ref 3.4–10.8)

## 2023-10-24 LAB — LIPID PANEL
Chol/HDL Ratio: 2.4 ratio (ref 0.0–4.4)
Cholesterol, Total: 146 mg/dL (ref 100–199)
HDL: 61 mg/dL (ref >39)
LDL Chol Calc (NIH): 61 mg/dL (ref 0–99)
Triglycerides: 138 mg/dL (ref 0–149)
VLDL Cholesterol Cal: 24 mg/dL (ref 5–40)

## 2023-10-24 LAB — COMPREHENSIVE METABOLIC PANEL WITH GFR
ALT: 14 IU/L (ref 0–32)
AST: 29 IU/L (ref 0–40)
Albumin: 4.9 g/dL — ABNORMAL HIGH (ref 3.7–4.7)
Alkaline Phosphatase: 80 IU/L (ref 44–121)
BUN/Creatinine Ratio: 21 (ref 12–28)
BUN: 23 mg/dL (ref 8–27)
Bilirubin Total: 1.5 mg/dL — ABNORMAL HIGH (ref 0.0–1.2)
CO2: 20 mmol/L (ref 20–29)
Calcium: 10.1 mg/dL (ref 8.7–10.3)
Chloride: 99 mmol/L (ref 96–106)
Creatinine, Ser: 1.11 mg/dL — ABNORMAL HIGH (ref 0.57–1.00)
Globulin, Total: 2.9 g/dL (ref 1.5–4.5)
Glucose: 92 mg/dL (ref 70–99)
Potassium: 4.8 mmol/L (ref 3.5–5.2)
Sodium: 140 mmol/L (ref 134–144)
Total Protein: 7.8 g/dL (ref 6.0–8.5)
eGFR: 50 mL/min/1.73 — ABNORMAL LOW (ref >59)

## 2023-10-24 LAB — TSH: TSH: 6.83 u[IU]/mL — ABNORMAL HIGH (ref 0.450–4.500)

## 2023-10-25 DIAGNOSIS — J9690 Respiratory failure, unspecified, unspecified whether with hypoxia or hypercapnia: Secondary | ICD-10-CM | POA: Diagnosis not present

## 2023-10-25 DIAGNOSIS — Z89422 Acquired absence of other left toe(s): Secondary | ICD-10-CM | POA: Diagnosis not present

## 2023-10-25 DIAGNOSIS — D649 Anemia, unspecified: Secondary | ICD-10-CM | POA: Diagnosis not present

## 2023-10-25 DIAGNOSIS — E1151 Type 2 diabetes mellitus with diabetic peripheral angiopathy without gangrene: Secondary | ICD-10-CM | POA: Diagnosis not present

## 2023-10-25 DIAGNOSIS — I110 Hypertensive heart disease with heart failure: Secondary | ICD-10-CM | POA: Diagnosis not present

## 2023-10-25 DIAGNOSIS — H269 Unspecified cataract: Secondary | ICD-10-CM | POA: Diagnosis not present

## 2023-10-25 DIAGNOSIS — I214 Non-ST elevation (NSTEMI) myocardial infarction: Secondary | ICD-10-CM | POA: Diagnosis not present

## 2023-10-25 DIAGNOSIS — R32 Unspecified urinary incontinence: Secondary | ICD-10-CM | POA: Diagnosis not present

## 2023-10-25 DIAGNOSIS — I5021 Acute systolic (congestive) heart failure: Secondary | ICD-10-CM | POA: Diagnosis not present

## 2023-10-26 ENCOUNTER — Ambulatory Visit: Payer: Self-pay | Admitting: Cardiology

## 2023-10-26 DIAGNOSIS — I5020 Unspecified systolic (congestive) heart failure: Secondary | ICD-10-CM

## 2023-10-26 DIAGNOSIS — I480 Paroxysmal atrial fibrillation: Secondary | ICD-10-CM

## 2023-10-26 DIAGNOSIS — I739 Peripheral vascular disease, unspecified: Secondary | ICD-10-CM

## 2023-10-27 ENCOUNTER — Telehealth: Payer: Self-pay

## 2023-10-27 NOTE — Transitions of Care (Post Inpatient/ED Visit) (Unsigned)
   10/27/2023  Name: Ann Powell MRN: [id number] DOB: May 09, 1940  Today's TOC FU Call Status: Today's TOC FU Call Status:: Unsuccessful Call (1st Attempt) Unsuccessful Call (1st Attempt) Date: 10/27/23  Attempted to reach the patient regarding the most recent Inpatient/ED visit.  Follow Up Plan: Additional outreach attempts will be made to reach the patient to complete the Transitions of Care (Post Inpatient/ED visit) call.   Signature Julian Lemmings, LPN [facility] Nurse Health Advisor Direct Dial [phone]

## 2023-10-27 NOTE — Telephone Encounter (Signed)
 Verbal orders given via identified voicemail. Mjp,lpn  Copied from CRM [id number]. Topic: Clinical - Home Health Verbal Orders >> Oct 27, 2023  8:39 AM Rosaria BRAVO wrote: Caller/Agency: [facility]  Callback Number: [phone] Service Requested: Physical Therapy Frequency:  1w7 Any new concerns about the patient? No

## 2023-10-28 NOTE — Transitions of Care (Post Inpatient/ED Visit) (Unsigned)
   10/28/2023  Name: Ann Powell MRN: [id number] DOB: April 05, 1940  Today's TOC FU Call Status: Today's TOC FU Call Status:: Unsuccessful Call (2nd Attempt) Unsuccessful Call (1st Attempt) Date: 10/27/23 Unsuccessful Call (2nd Attempt) Date: 10/28/23  Attempted to reach the patient regarding the most recent Inpatient/ED visit.  Follow Up Plan: Additional outreach attempts will be made to reach the patient to complete the Transitions of Care (Post Inpatient/ED visit) call.   Signature Julian Lemmings, LPN [facility] Nurse Health Advisor Direct Dial [phone]

## 2023-10-29 NOTE — Transitions of Care (Post Inpatient/ED Visit) (Signed)
   10/29/2023  Name: Ann Powell MRN: [id number] DOB: 11-24-1940  Today's TOC FU Call Status: Today's TOC FU Call Status:: Successful TOC FU Call Completed Unsuccessful Call (1st Attempt) Date: 10/27/23 Unsuccessful Call (2nd Attempt) Date: 10/28/23 [facility] FU Call Complete Date: 10/29/23 Patient's Name and Date of Birth confirmed.  Transition Care Management Follow-up Telephone Call Date of Discharge: 10/24/23 Discharge Facility: Other ([facility]) Name of Other ([facility]) Discharge Facility: [facility] Type of Discharge: Inpatient Admission Primary Inpatient Discharge Diagnosis:: weakness  NSTEMI How have you been since you were released from the hospital?: Better Any questions or concerns?: No  Items Reviewed: Did you receive and understand the discharge instructions provided?: Yes Medications obtained,verified, and reconciled?: Yes (Medications Reviewed) Any new allergies since your discharge?: No Dietary orders reviewed?: Yes Do you have support at home?: Yes Name of Support/Comfort Primary Source: resident at [facility]  Medications Reviewed Today: Medications Reviewed Today   Medications were not reviewed in this encounter     Home Care and Equipment/Supplies: Were Home Health Services Ordered?: NA Any new equipment or medical supplies ordered?: NA  Functional Questionnaire: Do you need assistance with bathing/showering or dressing?: Yes Do you need assistance with meal preparation?: Yes Do you need assistance with eating?: No Do you have difficulty maintaining continence: No Do you need assistance with getting out of bed/getting out of a chair/moving?: Yes Do you have difficulty managing or taking your medications?: Yes  Follow up appointments reviewed: PCP Follow-up appointment confirmed?: No (resident of Ann Powell) MD Provider Line Number:[phone] Given: No Specialist Hospital Follow-up appointment confirmed?: NA Do you need  transportation to your follow-up appointment?: No Do you understand care options if your condition(s) worsen?: Yes-patient verbalized understanding    SIGNATURE Julian Lemmings, LPN [facility] Nurse Health Advisor Direct Dial [phone]

## 2023-10-30 DIAGNOSIS — Z89422 Acquired absence of other left toe(s): Secondary | ICD-10-CM | POA: Diagnosis not present

## 2023-10-30 DIAGNOSIS — D649 Anemia, unspecified: Secondary | ICD-10-CM | POA: Diagnosis not present

## 2023-10-30 DIAGNOSIS — J9690 Respiratory failure, unspecified, unspecified whether with hypoxia or hypercapnia: Secondary | ICD-10-CM | POA: Diagnosis not present

## 2023-10-30 DIAGNOSIS — I214 Non-ST elevation (NSTEMI) myocardial infarction: Secondary | ICD-10-CM | POA: Diagnosis not present

## 2023-10-30 DIAGNOSIS — E1151 Type 2 diabetes mellitus with diabetic peripheral angiopathy without gangrene: Secondary | ICD-10-CM | POA: Diagnosis not present

## 2023-10-30 DIAGNOSIS — I5021 Acute systolic (congestive) heart failure: Secondary | ICD-10-CM | POA: Diagnosis not present

## 2023-10-30 DIAGNOSIS — R32 Unspecified urinary incontinence: Secondary | ICD-10-CM | POA: Diagnosis not present

## 2023-10-30 DIAGNOSIS — I110 Hypertensive heart disease with heart failure: Secondary | ICD-10-CM | POA: Diagnosis not present

## 2023-10-30 DIAGNOSIS — H269 Unspecified cataract: Secondary | ICD-10-CM | POA: Diagnosis not present

## 2023-11-03 DIAGNOSIS — B351 Tinea unguium: Secondary | ICD-10-CM | POA: Diagnosis not present

## 2023-11-03 DIAGNOSIS — M2010 Hallux valgus (acquired), unspecified foot: Secondary | ICD-10-CM | POA: Diagnosis not present

## 2023-11-03 DIAGNOSIS — E11621 Type 2 diabetes mellitus with foot ulcer: Secondary | ICD-10-CM | POA: Diagnosis not present

## 2023-11-03 DIAGNOSIS — M2040 Other hammer toe(s) (acquired), unspecified foot: Secondary | ICD-10-CM | POA: Diagnosis not present

## 2023-11-03 DIAGNOSIS — L600 Ingrowing nail: Secondary | ICD-10-CM | POA: Diagnosis not present

## 2023-11-03 DIAGNOSIS — M79671 Pain in right foot: Secondary | ICD-10-CM | POA: Diagnosis not present

## 2023-11-03 DIAGNOSIS — L84 Corns and callosities: Secondary | ICD-10-CM | POA: Diagnosis not present

## 2023-11-03 DIAGNOSIS — E1140 Type 2 diabetes mellitus with diabetic neuropathy, unspecified: Secondary | ICD-10-CM | POA: Diagnosis not present

## 2023-11-03 DIAGNOSIS — M79672 Pain in left foot: Secondary | ICD-10-CM | POA: Diagnosis not present

## 2023-11-04 DIAGNOSIS — M79672 Pain in left foot: Secondary | ICD-10-CM | POA: Diagnosis not present

## 2023-11-04 MED ORDER — ATORVASTATIN CALCIUM 80 MG PO TABS: 80.0000 mg | ORAL_TABLET | Freq: Every day | ORAL | 3 refills | Status: AC

## 2023-11-04 NOTE — Telephone Encounter (Signed)
-----   Message from Katlyn D West sent at 10/26/2023  9:48 AM EDT ----- Please let Ms. Tarnowski know that her CBC shows improvement in her hemoglobin and hematocrit, is now in normal range, there is no evidence of infection. Her kidney function is stable and her liver  function is normal. Her electrolytes are normal. Her TSH is mildly elevated, recommend a repeat TSH and free T4 in 6-8 weeks. Her LDL or bad cholesterol is slightly above goal, recommend increasing  her atorvastatin  to 80 mg daily with repeat fasting lipid profile and LFTs in 6-8 weeks.  ----- Message ----- From: Rebecka Memos Lab Results In Sent: 10/24/2023   2:35 AM EDT To: Katlyn D West, NP

## 2023-11-04 NOTE — Telephone Encounter (Signed)
 Called patient advised of below they verbalized understanding.

## 2023-11-06 DIAGNOSIS — D649 Anemia, unspecified: Secondary | ICD-10-CM | POA: Diagnosis not present

## 2023-11-06 DIAGNOSIS — I110 Hypertensive heart disease with heart failure: Secondary | ICD-10-CM | POA: Diagnosis not present

## 2023-11-06 DIAGNOSIS — J9690 Respiratory failure, unspecified, unspecified whether with hypoxia or hypercapnia: Secondary | ICD-10-CM | POA: Diagnosis not present

## 2023-11-06 DIAGNOSIS — H269 Unspecified cataract: Secondary | ICD-10-CM | POA: Diagnosis not present

## 2023-11-06 DIAGNOSIS — I214 Non-ST elevation (NSTEMI) myocardial infarction: Secondary | ICD-10-CM | POA: Diagnosis not present

## 2023-11-06 DIAGNOSIS — E1151 Type 2 diabetes mellitus with diabetic peripheral angiopathy without gangrene: Secondary | ICD-10-CM | POA: Diagnosis not present

## 2023-11-06 DIAGNOSIS — I5021 Acute systolic (congestive) heart failure: Secondary | ICD-10-CM | POA: Diagnosis not present

## 2023-11-06 DIAGNOSIS — Z89422 Acquired absence of other left toe(s): Secondary | ICD-10-CM | POA: Diagnosis not present

## 2023-11-06 DIAGNOSIS — R32 Unspecified urinary incontinence: Secondary | ICD-10-CM | POA: Diagnosis not present

## 2023-11-07 DIAGNOSIS — I480 Paroxysmal atrial fibrillation: Secondary | ICD-10-CM | POA: Diagnosis not present

## 2023-11-07 DIAGNOSIS — I110 Hypertensive heart disease with heart failure: Secondary | ICD-10-CM | POA: Diagnosis not present

## 2023-11-07 DIAGNOSIS — E1142 Type 2 diabetes mellitus with diabetic polyneuropathy: Secondary | ICD-10-CM | POA: Diagnosis not present

## 2023-11-07 DIAGNOSIS — I5022 Chronic systolic (congestive) heart failure: Secondary | ICD-10-CM | POA: Diagnosis not present

## 2023-11-07 DIAGNOSIS — I700 Atherosclerosis of aorta: Secondary | ICD-10-CM | POA: Diagnosis not present

## 2023-11-07 DIAGNOSIS — R269 Unspecified abnormalities of gait and mobility: Secondary | ICD-10-CM | POA: Diagnosis not present

## 2023-11-07 DIAGNOSIS — K219 Gastro-esophageal reflux disease without esophagitis: Secondary | ICD-10-CM | POA: Diagnosis not present

## 2023-11-11 DIAGNOSIS — E1140 Type 2 diabetes mellitus with diabetic neuropathy, unspecified: Secondary | ICD-10-CM | POA: Diagnosis not present

## 2023-11-11 DIAGNOSIS — I480 Paroxysmal atrial fibrillation: Secondary | ICD-10-CM | POA: Diagnosis not present

## 2023-11-11 DIAGNOSIS — E11621 Type 2 diabetes mellitus with foot ulcer: Secondary | ICD-10-CM | POA: Diagnosis not present

## 2023-11-11 DIAGNOSIS — M79672 Pain in left foot: Secondary | ICD-10-CM | POA: Diagnosis not present

## 2023-11-11 DIAGNOSIS — M79671 Pain in right foot: Secondary | ICD-10-CM | POA: Diagnosis not present

## 2023-11-12 DIAGNOSIS — I480 Paroxysmal atrial fibrillation: Secondary | ICD-10-CM | POA: Diagnosis not present

## 2023-11-13 DIAGNOSIS — I214 Non-ST elevation (NSTEMI) myocardial infarction: Secondary | ICD-10-CM | POA: Diagnosis not present

## 2023-11-13 DIAGNOSIS — M79671 Pain in right foot: Secondary | ICD-10-CM | POA: Diagnosis not present

## 2023-11-13 DIAGNOSIS — Z89422 Acquired absence of other left toe(s): Secondary | ICD-10-CM | POA: Diagnosis not present

## 2023-11-13 DIAGNOSIS — I110 Hypertensive heart disease with heart failure: Secondary | ICD-10-CM | POA: Diagnosis not present

## 2023-11-13 DIAGNOSIS — R32 Unspecified urinary incontinence: Secondary | ICD-10-CM | POA: Diagnosis not present

## 2023-11-13 DIAGNOSIS — J9690 Respiratory failure, unspecified, unspecified whether with hypoxia or hypercapnia: Secondary | ICD-10-CM | POA: Diagnosis not present

## 2023-11-13 DIAGNOSIS — I5021 Acute systolic (congestive) heart failure: Secondary | ICD-10-CM | POA: Diagnosis not present

## 2023-11-13 DIAGNOSIS — E1151 Type 2 diabetes mellitus with diabetic peripheral angiopathy without gangrene: Secondary | ICD-10-CM | POA: Diagnosis not present

## 2023-11-13 DIAGNOSIS — D649 Anemia, unspecified: Secondary | ICD-10-CM | POA: Diagnosis not present

## 2023-11-13 DIAGNOSIS — H269 Unspecified cataract: Secondary | ICD-10-CM | POA: Diagnosis not present

## 2023-11-19 DIAGNOSIS — J9690 Respiratory failure, unspecified, unspecified whether with hypoxia or hypercapnia: Secondary | ICD-10-CM | POA: Diagnosis not present

## 2023-11-19 DIAGNOSIS — I5021 Acute systolic (congestive) heart failure: Secondary | ICD-10-CM | POA: Diagnosis not present

## 2023-11-19 DIAGNOSIS — I110 Hypertensive heart disease with heart failure: Secondary | ICD-10-CM | POA: Diagnosis not present

## 2023-11-19 DIAGNOSIS — H269 Unspecified cataract: Secondary | ICD-10-CM | POA: Diagnosis not present

## 2023-11-19 DIAGNOSIS — E1151 Type 2 diabetes mellitus with diabetic peripheral angiopathy without gangrene: Secondary | ICD-10-CM | POA: Diagnosis not present

## 2023-11-19 DIAGNOSIS — Z89422 Acquired absence of other left toe(s): Secondary | ICD-10-CM | POA: Diagnosis not present

## 2023-11-19 DIAGNOSIS — D649 Anemia, unspecified: Secondary | ICD-10-CM | POA: Diagnosis not present

## 2023-11-19 DIAGNOSIS — I214 Non-ST elevation (NSTEMI) myocardial infarction: Secondary | ICD-10-CM | POA: Diagnosis not present

## 2023-11-19 DIAGNOSIS — R32 Unspecified urinary incontinence: Secondary | ICD-10-CM | POA: Diagnosis not present

## 2023-11-21 DIAGNOSIS — Z7902 Long term (current) use of antithrombotics/antiplatelets: Secondary | ICD-10-CM

## 2023-11-21 DIAGNOSIS — K5900 Constipation, unspecified: Secondary | ICD-10-CM

## 2023-11-21 DIAGNOSIS — I110 Hypertensive heart disease with heart failure: Secondary | ICD-10-CM | POA: Diagnosis not present

## 2023-11-21 DIAGNOSIS — D649 Anemia, unspecified: Secondary | ICD-10-CM | POA: Diagnosis not present

## 2023-11-21 DIAGNOSIS — R32 Unspecified urinary incontinence: Secondary | ICD-10-CM | POA: Diagnosis not present

## 2023-11-21 DIAGNOSIS — Z89422 Acquired absence of other left toe(s): Secondary | ICD-10-CM | POA: Diagnosis not present

## 2023-11-21 DIAGNOSIS — E1151 Type 2 diabetes mellitus with diabetic peripheral angiopathy without gangrene: Secondary | ICD-10-CM | POA: Diagnosis not present

## 2023-11-21 DIAGNOSIS — I214 Non-ST elevation (NSTEMI) myocardial infarction: Secondary | ICD-10-CM | POA: Diagnosis not present

## 2023-11-21 DIAGNOSIS — I5021 Acute systolic (congestive) heart failure: Secondary | ICD-10-CM | POA: Diagnosis not present

## 2023-11-21 DIAGNOSIS — J9690 Respiratory failure, unspecified, unspecified whether with hypoxia or hypercapnia: Secondary | ICD-10-CM | POA: Diagnosis not present

## 2023-11-21 DIAGNOSIS — H269 Unspecified cataract: Secondary | ICD-10-CM | POA: Diagnosis not present

## 2023-11-21 DIAGNOSIS — Z7982 Long term (current) use of aspirin: Secondary | ICD-10-CM

## 2023-11-24 DIAGNOSIS — I5021 Acute systolic (congestive) heart failure: Secondary | ICD-10-CM | POA: Diagnosis not present

## 2023-11-24 DIAGNOSIS — D649 Anemia, unspecified: Secondary | ICD-10-CM | POA: Diagnosis not present

## 2023-11-24 DIAGNOSIS — J9690 Respiratory failure, unspecified, unspecified whether with hypoxia or hypercapnia: Secondary | ICD-10-CM | POA: Diagnosis not present

## 2023-11-24 DIAGNOSIS — R32 Unspecified urinary incontinence: Secondary | ICD-10-CM | POA: Diagnosis not present

## 2023-11-24 DIAGNOSIS — Z89422 Acquired absence of other left toe(s): Secondary | ICD-10-CM | POA: Diagnosis not present

## 2023-11-24 DIAGNOSIS — H269 Unspecified cataract: Secondary | ICD-10-CM | POA: Diagnosis not present

## 2023-11-24 DIAGNOSIS — I214 Non-ST elevation (NSTEMI) myocardial infarction: Secondary | ICD-10-CM | POA: Diagnosis not present

## 2023-11-24 DIAGNOSIS — E1151 Type 2 diabetes mellitus with diabetic peripheral angiopathy without gangrene: Secondary | ICD-10-CM | POA: Diagnosis not present

## 2023-11-24 DIAGNOSIS — I110 Hypertensive heart disease with heart failure: Secondary | ICD-10-CM | POA: Diagnosis not present

## 2023-11-26 DIAGNOSIS — D649 Anemia, unspecified: Secondary | ICD-10-CM | POA: Diagnosis not present

## 2023-11-26 DIAGNOSIS — I5022 Chronic systolic (congestive) heart failure: Secondary | ICD-10-CM | POA: Diagnosis not present

## 2023-11-26 DIAGNOSIS — I110 Hypertensive heart disease with heart failure: Secondary | ICD-10-CM | POA: Diagnosis not present

## 2023-11-26 DIAGNOSIS — I480 Paroxysmal atrial fibrillation: Secondary | ICD-10-CM | POA: Diagnosis not present

## 2023-11-26 DIAGNOSIS — R32 Unspecified urinary incontinence: Secondary | ICD-10-CM | POA: Diagnosis not present

## 2023-11-26 DIAGNOSIS — H269 Unspecified cataract: Secondary | ICD-10-CM | POA: Diagnosis not present

## 2023-11-26 DIAGNOSIS — I5021 Acute systolic (congestive) heart failure: Secondary | ICD-10-CM | POA: Diagnosis not present

## 2023-11-26 DIAGNOSIS — I214 Non-ST elevation (NSTEMI) myocardial infarction: Secondary | ICD-10-CM | POA: Diagnosis not present

## 2023-11-26 DIAGNOSIS — E1151 Type 2 diabetes mellitus with diabetic peripheral angiopathy without gangrene: Secondary | ICD-10-CM | POA: Diagnosis not present

## 2023-11-26 DIAGNOSIS — Z89422 Acquired absence of other left toe(s): Secondary | ICD-10-CM | POA: Diagnosis not present

## 2023-11-26 DIAGNOSIS — E1142 Type 2 diabetes mellitus with diabetic polyneuropathy: Secondary | ICD-10-CM | POA: Diagnosis not present

## 2023-11-26 DIAGNOSIS — J9690 Respiratory failure, unspecified, unspecified whether with hypoxia or hypercapnia: Secondary | ICD-10-CM | POA: Diagnosis not present

## 2023-11-27 DIAGNOSIS — I10 Essential (primary) hypertension: Secondary | ICD-10-CM | POA: Diagnosis not present

## 2023-11-28 DIAGNOSIS — R32 Unspecified urinary incontinence: Secondary | ICD-10-CM | POA: Diagnosis not present

## 2023-11-28 DIAGNOSIS — Z89422 Acquired absence of other left toe(s): Secondary | ICD-10-CM | POA: Diagnosis not present

## 2023-11-28 DIAGNOSIS — D649 Anemia, unspecified: Secondary | ICD-10-CM | POA: Diagnosis not present

## 2023-11-28 DIAGNOSIS — I110 Hypertensive heart disease with heart failure: Secondary | ICD-10-CM | POA: Diagnosis not present

## 2023-11-28 DIAGNOSIS — E1151 Type 2 diabetes mellitus with diabetic peripheral angiopathy without gangrene: Secondary | ICD-10-CM | POA: Diagnosis not present

## 2023-11-28 DIAGNOSIS — J9690 Respiratory failure, unspecified, unspecified whether with hypoxia or hypercapnia: Secondary | ICD-10-CM | POA: Diagnosis not present

## 2023-11-28 DIAGNOSIS — I5021 Acute systolic (congestive) heart failure: Secondary | ICD-10-CM | POA: Diagnosis not present

## 2023-11-28 DIAGNOSIS — H269 Unspecified cataract: Secondary | ICD-10-CM | POA: Diagnosis not present

## 2023-11-28 DIAGNOSIS — I214 Non-ST elevation (NSTEMI) myocardial infarction: Secondary | ICD-10-CM | POA: Diagnosis not present

## 2023-11-28 NOTE — Telephone Encounter (Signed)
 Called patient advised of below they verbalized understanding Patient has chosen to stop the amiodarone .

## 2023-11-28 NOTE — Telephone Encounter (Signed)
-----   Message from Katlyn D West sent at 11/25/2023  4:52 PM EDT ----- Please let Ms. Foisy know that her cardiac monitor indicated that her predominant underlying rhythm was sinus rhythm.  There was no evidence of atrial fibrillation.  Previously discussed with Dr.  Kate, as there was no evidence of atrial fibrillation on her cardiac monitor she may discontinue amiodarone .  Overall good results, follow-up with Dr. Kate on 01/28/24 as planned.  ----- Message ----- From: Kate Lonni CROME, MD Sent: 11/12/2023   2:10 PM EDT To: Katlyn D West, NP

## 2023-12-02 DIAGNOSIS — I10 Essential (primary) hypertension: Secondary | ICD-10-CM | POA: Diagnosis not present

## 2023-12-02 DIAGNOSIS — I5022 Chronic systolic (congestive) heart failure: Secondary | ICD-10-CM | POA: Diagnosis not present

## 2023-12-02 DIAGNOSIS — S91109A Unspecified open wound of unspecified toe(s) without damage to nail, initial encounter: Secondary | ICD-10-CM | POA: Diagnosis not present

## 2023-12-02 DIAGNOSIS — E1142 Type 2 diabetes mellitus with diabetic polyneuropathy: Secondary | ICD-10-CM | POA: Diagnosis not present

## 2023-12-03 DIAGNOSIS — I5021 Acute systolic (congestive) heart failure: Secondary | ICD-10-CM | POA: Diagnosis not present

## 2023-12-03 DIAGNOSIS — I110 Hypertensive heart disease with heart failure: Secondary | ICD-10-CM | POA: Diagnosis not present

## 2023-12-03 DIAGNOSIS — D649 Anemia, unspecified: Secondary | ICD-10-CM | POA: Diagnosis not present

## 2023-12-03 DIAGNOSIS — E1151 Type 2 diabetes mellitus with diabetic peripheral angiopathy without gangrene: Secondary | ICD-10-CM | POA: Diagnosis not present

## 2023-12-03 DIAGNOSIS — R32 Unspecified urinary incontinence: Secondary | ICD-10-CM | POA: Diagnosis not present

## 2023-12-03 DIAGNOSIS — J9690 Respiratory failure, unspecified, unspecified whether with hypoxia or hypercapnia: Secondary | ICD-10-CM | POA: Diagnosis not present

## 2023-12-03 DIAGNOSIS — I214 Non-ST elevation (NSTEMI) myocardial infarction: Secondary | ICD-10-CM | POA: Diagnosis not present

## 2023-12-03 DIAGNOSIS — Z89422 Acquired absence of other left toe(s): Secondary | ICD-10-CM | POA: Diagnosis not present

## 2023-12-03 DIAGNOSIS — H269 Unspecified cataract: Secondary | ICD-10-CM | POA: Diagnosis not present

## 2023-12-09 DIAGNOSIS — N183 Chronic kidney disease, stage 3 unspecified: Secondary | ICD-10-CM | POA: Diagnosis not present

## 2023-12-09 DIAGNOSIS — E1136 Type 2 diabetes mellitus with diabetic cataract: Secondary | ICD-10-CM | POA: Diagnosis not present

## 2023-12-09 DIAGNOSIS — I110 Hypertensive heart disease with heart failure: Secondary | ICD-10-CM | POA: Diagnosis not present

## 2023-12-09 DIAGNOSIS — L89892 Pressure ulcer of other site, stage 2: Secondary | ICD-10-CM | POA: Diagnosis not present

## 2023-12-09 DIAGNOSIS — E1142 Type 2 diabetes mellitus with diabetic polyneuropathy: Secondary | ICD-10-CM | POA: Diagnosis not present

## 2023-12-09 DIAGNOSIS — I5022 Chronic systolic (congestive) heart failure: Secondary | ICD-10-CM | POA: Diagnosis not present

## 2023-12-09 DIAGNOSIS — D631 Anemia in chronic kidney disease: Secondary | ICD-10-CM | POA: Diagnosis not present

## 2023-12-09 DIAGNOSIS — E1122 Type 2 diabetes mellitus with diabetic chronic kidney disease: Secondary | ICD-10-CM | POA: Diagnosis not present

## 2023-12-09 DIAGNOSIS — E1151 Type 2 diabetes mellitus with diabetic peripheral angiopathy without gangrene: Secondary | ICD-10-CM | POA: Diagnosis not present

## 2023-12-12 DIAGNOSIS — E038 Other specified hypothyroidism: Secondary | ICD-10-CM | POA: Diagnosis not present

## 2023-12-12 DIAGNOSIS — N183 Chronic kidney disease, stage 3 unspecified: Secondary | ICD-10-CM | POA: Diagnosis not present

## 2023-12-12 DIAGNOSIS — E1142 Type 2 diabetes mellitus with diabetic polyneuropathy: Secondary | ICD-10-CM | POA: Diagnosis not present

## 2023-12-12 DIAGNOSIS — I509 Heart failure, unspecified: Secondary | ICD-10-CM | POA: Diagnosis not present

## 2023-12-12 DIAGNOSIS — I480 Paroxysmal atrial fibrillation: Secondary | ICD-10-CM | POA: Diagnosis not present

## 2023-12-12 DIAGNOSIS — E559 Vitamin D deficiency, unspecified: Secondary | ICD-10-CM | POA: Diagnosis not present

## 2023-12-12 DIAGNOSIS — D519 Vitamin B12 deficiency anemia, unspecified: Secondary | ICD-10-CM | POA: Diagnosis not present

## 2023-12-12 DIAGNOSIS — I110 Hypertensive heart disease with heart failure: Secondary | ICD-10-CM | POA: Diagnosis not present

## 2023-12-12 DIAGNOSIS — E1136 Type 2 diabetes mellitus with diabetic cataract: Secondary | ICD-10-CM | POA: Diagnosis not present

## 2023-12-12 DIAGNOSIS — D631 Anemia in chronic kidney disease: Secondary | ICD-10-CM | POA: Diagnosis not present

## 2023-12-12 DIAGNOSIS — E782 Mixed hyperlipidemia: Secondary | ICD-10-CM | POA: Diagnosis not present

## 2023-12-12 DIAGNOSIS — E1151 Type 2 diabetes mellitus with diabetic peripheral angiopathy without gangrene: Secondary | ICD-10-CM | POA: Diagnosis not present

## 2023-12-12 DIAGNOSIS — E1122 Type 2 diabetes mellitus with diabetic chronic kidney disease: Secondary | ICD-10-CM | POA: Diagnosis not present

## 2023-12-12 DIAGNOSIS — I5022 Chronic systolic (congestive) heart failure: Secondary | ICD-10-CM | POA: Diagnosis not present

## 2023-12-12 DIAGNOSIS — L89892 Pressure ulcer of other site, stage 2: Secondary | ICD-10-CM | POA: Diagnosis not present

## 2023-12-16 DIAGNOSIS — D631 Anemia in chronic kidney disease: Secondary | ICD-10-CM | POA: Diagnosis not present

## 2023-12-16 DIAGNOSIS — I5022 Chronic systolic (congestive) heart failure: Secondary | ICD-10-CM | POA: Diagnosis not present

## 2023-12-16 DIAGNOSIS — E1151 Type 2 diabetes mellitus with diabetic peripheral angiopathy without gangrene: Secondary | ICD-10-CM | POA: Diagnosis not present

## 2023-12-16 DIAGNOSIS — N183 Chronic kidney disease, stage 3 unspecified: Secondary | ICD-10-CM | POA: Diagnosis not present

## 2023-12-16 DIAGNOSIS — E1142 Type 2 diabetes mellitus with diabetic polyneuropathy: Secondary | ICD-10-CM | POA: Diagnosis not present

## 2023-12-16 DIAGNOSIS — I509 Heart failure, unspecified: Secondary | ICD-10-CM | POA: Diagnosis not present

## 2023-12-16 DIAGNOSIS — E1136 Type 2 diabetes mellitus with diabetic cataract: Secondary | ICD-10-CM | POA: Diagnosis not present

## 2023-12-16 DIAGNOSIS — S91105A Unspecified open wound of left lesser toe(s) without damage to nail, initial encounter: Secondary | ICD-10-CM | POA: Diagnosis not present

## 2023-12-16 DIAGNOSIS — L89892 Pressure ulcer of other site, stage 2: Secondary | ICD-10-CM | POA: Diagnosis not present

## 2023-12-16 DIAGNOSIS — E1122 Type 2 diabetes mellitus with diabetic chronic kidney disease: Secondary | ICD-10-CM | POA: Diagnosis not present

## 2023-12-16 DIAGNOSIS — I110 Hypertensive heart disease with heart failure: Secondary | ICD-10-CM | POA: Diagnosis not present

## 2023-12-19 DIAGNOSIS — E1122 Type 2 diabetes mellitus with diabetic chronic kidney disease: Secondary | ICD-10-CM | POA: Diagnosis not present

## 2023-12-19 DIAGNOSIS — N183 Chronic kidney disease, stage 3 unspecified: Secondary | ICD-10-CM | POA: Diagnosis not present

## 2023-12-19 DIAGNOSIS — E1151 Type 2 diabetes mellitus with diabetic peripheral angiopathy without gangrene: Secondary | ICD-10-CM | POA: Diagnosis not present

## 2023-12-19 DIAGNOSIS — I110 Hypertensive heart disease with heart failure: Secondary | ICD-10-CM | POA: Diagnosis not present

## 2023-12-19 DIAGNOSIS — E1136 Type 2 diabetes mellitus with diabetic cataract: Secondary | ICD-10-CM | POA: Diagnosis not present

## 2023-12-19 DIAGNOSIS — I5022 Chronic systolic (congestive) heart failure: Secondary | ICD-10-CM | POA: Diagnosis not present

## 2023-12-19 DIAGNOSIS — L89892 Pressure ulcer of other site, stage 2: Secondary | ICD-10-CM | POA: Diagnosis not present

## 2023-12-19 DIAGNOSIS — E1142 Type 2 diabetes mellitus with diabetic polyneuropathy: Secondary | ICD-10-CM | POA: Diagnosis not present

## 2023-12-19 DIAGNOSIS — D631 Anemia in chronic kidney disease: Secondary | ICD-10-CM | POA: Diagnosis not present

## 2023-12-22 ENCOUNTER — Telehealth: Payer: Self-pay

## 2023-12-22 DIAGNOSIS — D631 Anemia in chronic kidney disease: Secondary | ICD-10-CM | POA: Diagnosis not present

## 2023-12-22 DIAGNOSIS — I110 Hypertensive heart disease with heart failure: Secondary | ICD-10-CM | POA: Diagnosis not present

## 2023-12-22 DIAGNOSIS — N183 Chronic kidney disease, stage 3 unspecified: Secondary | ICD-10-CM | POA: Diagnosis not present

## 2023-12-22 DIAGNOSIS — E1122 Type 2 diabetes mellitus with diabetic chronic kidney disease: Secondary | ICD-10-CM | POA: Diagnosis not present

## 2023-12-22 DIAGNOSIS — I5022 Chronic systolic (congestive) heart failure: Secondary | ICD-10-CM | POA: Diagnosis not present

## 2023-12-22 DIAGNOSIS — E1142 Type 2 diabetes mellitus with diabetic polyneuropathy: Secondary | ICD-10-CM | POA: Diagnosis not present

## 2023-12-22 DIAGNOSIS — E1136 Type 2 diabetes mellitus with diabetic cataract: Secondary | ICD-10-CM | POA: Diagnosis not present

## 2023-12-22 DIAGNOSIS — L89892 Pressure ulcer of other site, stage 2: Secondary | ICD-10-CM | POA: Diagnosis not present

## 2023-12-22 DIAGNOSIS — E1151 Type 2 diabetes mellitus with diabetic peripheral angiopathy without gangrene: Secondary | ICD-10-CM | POA: Diagnosis not present

## 2023-12-22 NOTE — Telephone Encounter (Signed)
 Copied from CRM #[id number]. Topic: Clinical - Medication Question >> Dec 22, 2023 10:16 AM Winona SAUNDERS wrote: Henna calling from [place] calling to suggest a few anti coagulants for the pt as she is listed as A Fib and would like to know if the provider would agree with adding one to the pts med list. Please call, [phone]

## 2023-12-23 DIAGNOSIS — E1140 Type 2 diabetes mellitus with diabetic neuropathy, unspecified: Secondary | ICD-10-CM | POA: Diagnosis not present

## 2023-12-23 DIAGNOSIS — M79671 Pain in right foot: Secondary | ICD-10-CM | POA: Diagnosis not present

## 2023-12-23 DIAGNOSIS — M79672 Pain in left foot: Secondary | ICD-10-CM | POA: Diagnosis not present

## 2023-12-23 DIAGNOSIS — E11621 Type 2 diabetes mellitus with foot ulcer: Secondary | ICD-10-CM | POA: Diagnosis not present

## 2023-12-25 DIAGNOSIS — E782 Mixed hyperlipidemia: Secondary | ICD-10-CM | POA: Diagnosis not present

## 2023-12-25 DIAGNOSIS — L89892 Pressure ulcer of other site, stage 2: Secondary | ICD-10-CM | POA: Diagnosis not present

## 2023-12-25 DIAGNOSIS — E038 Other specified hypothyroidism: Secondary | ICD-10-CM | POA: Diagnosis not present

## 2023-12-25 DIAGNOSIS — E1151 Type 2 diabetes mellitus with diabetic peripheral angiopathy without gangrene: Secondary | ICD-10-CM | POA: Diagnosis not present

## 2023-12-25 DIAGNOSIS — R7309 Other abnormal glucose: Secondary | ICD-10-CM | POA: Diagnosis not present

## 2023-12-25 DIAGNOSIS — E559 Vitamin D deficiency, unspecified: Secondary | ICD-10-CM | POA: Diagnosis not present

## 2023-12-25 DIAGNOSIS — E1136 Type 2 diabetes mellitus with diabetic cataract: Secondary | ICD-10-CM | POA: Diagnosis not present

## 2023-12-25 DIAGNOSIS — I110 Hypertensive heart disease with heart failure: Secondary | ICD-10-CM | POA: Diagnosis not present

## 2023-12-25 DIAGNOSIS — D631 Anemia in chronic kidney disease: Secondary | ICD-10-CM | POA: Diagnosis not present

## 2023-12-25 DIAGNOSIS — E1142 Type 2 diabetes mellitus with diabetic polyneuropathy: Secondary | ICD-10-CM | POA: Diagnosis not present

## 2023-12-25 DIAGNOSIS — E1122 Type 2 diabetes mellitus with diabetic chronic kidney disease: Secondary | ICD-10-CM | POA: Diagnosis not present

## 2023-12-25 DIAGNOSIS — Z79899 Other long term (current) drug therapy: Secondary | ICD-10-CM | POA: Diagnosis not present

## 2023-12-25 DIAGNOSIS — N183 Chronic kidney disease, stage 3 unspecified: Secondary | ICD-10-CM | POA: Diagnosis not present

## 2023-12-25 DIAGNOSIS — I5022 Chronic systolic (congestive) heart failure: Secondary | ICD-10-CM | POA: Diagnosis not present

## 2023-12-25 DIAGNOSIS — D519 Vitamin B12 deficiency anemia, unspecified: Secondary | ICD-10-CM | POA: Diagnosis not present

## 2023-12-28 DIAGNOSIS — I10 Essential (primary) hypertension: Secondary | ICD-10-CM | POA: Diagnosis not present

## 2023-12-30 DIAGNOSIS — E1151 Type 2 diabetes mellitus with diabetic peripheral angiopathy without gangrene: Secondary | ICD-10-CM | POA: Diagnosis not present

## 2023-12-30 DIAGNOSIS — I10 Essential (primary) hypertension: Secondary | ICD-10-CM | POA: Diagnosis not present

## 2023-12-30 DIAGNOSIS — E1142 Type 2 diabetes mellitus with diabetic polyneuropathy: Secondary | ICD-10-CM | POA: Diagnosis not present

## 2023-12-30 DIAGNOSIS — N183 Chronic kidney disease, stage 3 unspecified: Secondary | ICD-10-CM | POA: Diagnosis not present

## 2023-12-30 DIAGNOSIS — I480 Paroxysmal atrial fibrillation: Secondary | ICD-10-CM | POA: Diagnosis not present

## 2023-12-30 DIAGNOSIS — E119 Type 2 diabetes mellitus without complications: Secondary | ICD-10-CM | POA: Diagnosis not present

## 2023-12-30 DIAGNOSIS — L89892 Pressure ulcer of other site, stage 2: Secondary | ICD-10-CM | POA: Diagnosis not present

## 2023-12-30 DIAGNOSIS — I5022 Chronic systolic (congestive) heart failure: Secondary | ICD-10-CM | POA: Diagnosis not present

## 2023-12-30 DIAGNOSIS — D631 Anemia in chronic kidney disease: Secondary | ICD-10-CM | POA: Diagnosis not present

## 2023-12-30 DIAGNOSIS — E1122 Type 2 diabetes mellitus with diabetic chronic kidney disease: Secondary | ICD-10-CM | POA: Diagnosis not present

## 2023-12-30 DIAGNOSIS — E1136 Type 2 diabetes mellitus with diabetic cataract: Secondary | ICD-10-CM | POA: Diagnosis not present

## 2023-12-30 DIAGNOSIS — I110 Hypertensive heart disease with heart failure: Secondary | ICD-10-CM | POA: Diagnosis not present

## 2024-01-02 DIAGNOSIS — D631 Anemia in chronic kidney disease: Secondary | ICD-10-CM | POA: Diagnosis not present

## 2024-01-02 DIAGNOSIS — I5022 Chronic systolic (congestive) heart failure: Secondary | ICD-10-CM | POA: Diagnosis not present

## 2024-01-02 DIAGNOSIS — I110 Hypertensive heart disease with heart failure: Secondary | ICD-10-CM | POA: Diagnosis not present

## 2024-01-02 DIAGNOSIS — L89892 Pressure ulcer of other site, stage 2: Secondary | ICD-10-CM | POA: Diagnosis not present

## 2024-01-02 DIAGNOSIS — E1142 Type 2 diabetes mellitus with diabetic polyneuropathy: Secondary | ICD-10-CM | POA: Diagnosis not present

## 2024-01-02 DIAGNOSIS — E1151 Type 2 diabetes mellitus with diabetic peripheral angiopathy without gangrene: Secondary | ICD-10-CM | POA: Diagnosis not present

## 2024-01-02 DIAGNOSIS — E1122 Type 2 diabetes mellitus with diabetic chronic kidney disease: Secondary | ICD-10-CM | POA: Diagnosis not present

## 2024-01-02 DIAGNOSIS — N183 Chronic kidney disease, stage 3 unspecified: Secondary | ICD-10-CM | POA: Diagnosis not present

## 2024-01-02 DIAGNOSIS — E1136 Type 2 diabetes mellitus with diabetic cataract: Secondary | ICD-10-CM | POA: Diagnosis not present

## 2024-01-05 DIAGNOSIS — L89892 Pressure ulcer of other site, stage 2: Secondary | ICD-10-CM | POA: Diagnosis not present

## 2024-01-05 DIAGNOSIS — N183 Chronic kidney disease, stage 3 unspecified: Secondary | ICD-10-CM | POA: Diagnosis not present

## 2024-01-05 DIAGNOSIS — I110 Hypertensive heart disease with heart failure: Secondary | ICD-10-CM | POA: Diagnosis not present

## 2024-01-05 DIAGNOSIS — E1136 Type 2 diabetes mellitus with diabetic cataract: Secondary | ICD-10-CM | POA: Diagnosis not present

## 2024-01-05 DIAGNOSIS — E1122 Type 2 diabetes mellitus with diabetic chronic kidney disease: Secondary | ICD-10-CM | POA: Diagnosis not present

## 2024-01-05 DIAGNOSIS — I5022 Chronic systolic (congestive) heart failure: Secondary | ICD-10-CM | POA: Diagnosis not present

## 2024-01-05 DIAGNOSIS — E1142 Type 2 diabetes mellitus with diabetic polyneuropathy: Secondary | ICD-10-CM | POA: Diagnosis not present

## 2024-01-05 DIAGNOSIS — E1151 Type 2 diabetes mellitus with diabetic peripheral angiopathy without gangrene: Secondary | ICD-10-CM | POA: Diagnosis not present

## 2024-01-05 DIAGNOSIS — D631 Anemia in chronic kidney disease: Secondary | ICD-10-CM | POA: Diagnosis not present

## 2024-01-06 DIAGNOSIS — M79671 Pain in right foot: Secondary | ICD-10-CM | POA: Diagnosis not present

## 2024-01-06 DIAGNOSIS — E1140 Type 2 diabetes mellitus with diabetic neuropathy, unspecified: Secondary | ICD-10-CM | POA: Diagnosis not present

## 2024-01-06 DIAGNOSIS — E11621 Type 2 diabetes mellitus with foot ulcer: Secondary | ICD-10-CM | POA: Diagnosis not present

## 2024-01-06 DIAGNOSIS — M79672 Pain in left foot: Secondary | ICD-10-CM | POA: Diagnosis not present

## 2024-01-09 DIAGNOSIS — L89892 Pressure ulcer of other site, stage 2: Secondary | ICD-10-CM | POA: Diagnosis not present

## 2024-01-09 DIAGNOSIS — D631 Anemia in chronic kidney disease: Secondary | ICD-10-CM | POA: Diagnosis not present

## 2024-01-09 DIAGNOSIS — N183 Chronic kidney disease, stage 3 unspecified: Secondary | ICD-10-CM | POA: Diagnosis not present

## 2024-01-09 DIAGNOSIS — E1142 Type 2 diabetes mellitus with diabetic polyneuropathy: Secondary | ICD-10-CM | POA: Diagnosis not present

## 2024-01-09 DIAGNOSIS — E1151 Type 2 diabetes mellitus with diabetic peripheral angiopathy without gangrene: Secondary | ICD-10-CM | POA: Diagnosis not present

## 2024-01-09 DIAGNOSIS — I5022 Chronic systolic (congestive) heart failure: Secondary | ICD-10-CM | POA: Diagnosis not present

## 2024-01-09 DIAGNOSIS — I110 Hypertensive heart disease with heart failure: Secondary | ICD-10-CM | POA: Diagnosis not present

## 2024-01-09 DIAGNOSIS — E1136 Type 2 diabetes mellitus with diabetic cataract: Secondary | ICD-10-CM | POA: Diagnosis not present

## 2024-01-09 DIAGNOSIS — E1122 Type 2 diabetes mellitus with diabetic chronic kidney disease: Secondary | ICD-10-CM | POA: Diagnosis not present

## 2024-01-12 DIAGNOSIS — I110 Hypertensive heart disease with heart failure: Secondary | ICD-10-CM | POA: Diagnosis not present

## 2024-01-12 DIAGNOSIS — D631 Anemia in chronic kidney disease: Secondary | ICD-10-CM | POA: Diagnosis not present

## 2024-01-12 DIAGNOSIS — I5022 Chronic systolic (congestive) heart failure: Secondary | ICD-10-CM | POA: Diagnosis not present

## 2024-01-12 DIAGNOSIS — E1142 Type 2 diabetes mellitus with diabetic polyneuropathy: Secondary | ICD-10-CM | POA: Diagnosis not present

## 2024-01-12 DIAGNOSIS — L89892 Pressure ulcer of other site, stage 2: Secondary | ICD-10-CM | POA: Diagnosis not present

## 2024-01-12 DIAGNOSIS — E1122 Type 2 diabetes mellitus with diabetic chronic kidney disease: Secondary | ICD-10-CM | POA: Diagnosis not present

## 2024-01-12 DIAGNOSIS — E1151 Type 2 diabetes mellitus with diabetic peripheral angiopathy without gangrene: Secondary | ICD-10-CM | POA: Diagnosis not present

## 2024-01-12 DIAGNOSIS — E1136 Type 2 diabetes mellitus with diabetic cataract: Secondary | ICD-10-CM | POA: Diagnosis not present

## 2024-01-12 DIAGNOSIS — N183 Chronic kidney disease, stage 3 unspecified: Secondary | ICD-10-CM | POA: Diagnosis not present

## 2024-01-14 DIAGNOSIS — E1142 Type 2 diabetes mellitus with diabetic polyneuropathy: Secondary | ICD-10-CM | POA: Diagnosis not present

## 2024-01-14 DIAGNOSIS — E1136 Type 2 diabetes mellitus with diabetic cataract: Secondary | ICD-10-CM | POA: Diagnosis not present

## 2024-01-14 DIAGNOSIS — I5022 Chronic systolic (congestive) heart failure: Secondary | ICD-10-CM | POA: Diagnosis not present

## 2024-01-14 DIAGNOSIS — E1151 Type 2 diabetes mellitus with diabetic peripheral angiopathy without gangrene: Secondary | ICD-10-CM | POA: Diagnosis not present

## 2024-01-14 DIAGNOSIS — L89892 Pressure ulcer of other site, stage 2: Secondary | ICD-10-CM | POA: Diagnosis not present

## 2024-01-14 DIAGNOSIS — E1122 Type 2 diabetes mellitus with diabetic chronic kidney disease: Secondary | ICD-10-CM | POA: Diagnosis not present

## 2024-01-14 DIAGNOSIS — N183 Chronic kidney disease, stage 3 unspecified: Secondary | ICD-10-CM | POA: Diagnosis not present

## 2024-01-14 DIAGNOSIS — D631 Anemia in chronic kidney disease: Secondary | ICD-10-CM | POA: Diagnosis not present

## 2024-01-14 DIAGNOSIS — I110 Hypertensive heart disease with heart failure: Secondary | ICD-10-CM | POA: Diagnosis not present

## 2024-01-19 DIAGNOSIS — I110 Hypertensive heart disease with heart failure: Secondary | ICD-10-CM | POA: Diagnosis not present

## 2024-01-19 DIAGNOSIS — E1151 Type 2 diabetes mellitus with diabetic peripheral angiopathy without gangrene: Secondary | ICD-10-CM | POA: Diagnosis not present

## 2024-01-19 DIAGNOSIS — N183 Chronic kidney disease, stage 3 unspecified: Secondary | ICD-10-CM | POA: Diagnosis not present

## 2024-01-19 DIAGNOSIS — I5022 Chronic systolic (congestive) heart failure: Secondary | ICD-10-CM | POA: Diagnosis not present

## 2024-01-19 DIAGNOSIS — E1136 Type 2 diabetes mellitus with diabetic cataract: Secondary | ICD-10-CM | POA: Diagnosis not present

## 2024-01-19 DIAGNOSIS — D631 Anemia in chronic kidney disease: Secondary | ICD-10-CM | POA: Diagnosis not present

## 2024-01-19 DIAGNOSIS — E1142 Type 2 diabetes mellitus with diabetic polyneuropathy: Secondary | ICD-10-CM | POA: Diagnosis not present

## 2024-01-19 DIAGNOSIS — E1122 Type 2 diabetes mellitus with diabetic chronic kidney disease: Secondary | ICD-10-CM | POA: Diagnosis not present

## 2024-01-19 DIAGNOSIS — L89892 Pressure ulcer of other site, stage 2: Secondary | ICD-10-CM | POA: Diagnosis not present

## 2024-01-22 ENCOUNTER — Ambulatory Visit: Payer: Medicare HMO

## 2024-01-23 DIAGNOSIS — E1122 Type 2 diabetes mellitus with diabetic chronic kidney disease: Secondary | ICD-10-CM | POA: Diagnosis not present

## 2024-01-23 DIAGNOSIS — I5022 Chronic systolic (congestive) heart failure: Secondary | ICD-10-CM | POA: Diagnosis not present

## 2024-01-23 DIAGNOSIS — E1142 Type 2 diabetes mellitus with diabetic polyneuropathy: Secondary | ICD-10-CM | POA: Diagnosis not present

## 2024-01-23 DIAGNOSIS — I110 Hypertensive heart disease with heart failure: Secondary | ICD-10-CM | POA: Diagnosis not present

## 2024-01-23 DIAGNOSIS — E1151 Type 2 diabetes mellitus with diabetic peripheral angiopathy without gangrene: Secondary | ICD-10-CM | POA: Diagnosis not present

## 2024-01-23 DIAGNOSIS — L89892 Pressure ulcer of other site, stage 2: Secondary | ICD-10-CM | POA: Diagnosis not present

## 2024-01-23 DIAGNOSIS — E1136 Type 2 diabetes mellitus with diabetic cataract: Secondary | ICD-10-CM | POA: Diagnosis not present

## 2024-01-23 DIAGNOSIS — N183 Chronic kidney disease, stage 3 unspecified: Secondary | ICD-10-CM | POA: Diagnosis not present

## 2024-01-23 DIAGNOSIS — D631 Anemia in chronic kidney disease: Secondary | ICD-10-CM | POA: Diagnosis not present

## 2024-01-26 DIAGNOSIS — D631 Anemia in chronic kidney disease: Secondary | ICD-10-CM | POA: Diagnosis not present

## 2024-01-26 DIAGNOSIS — I110 Hypertensive heart disease with heart failure: Secondary | ICD-10-CM | POA: Diagnosis not present

## 2024-01-26 DIAGNOSIS — E1151 Type 2 diabetes mellitus with diabetic peripheral angiopathy without gangrene: Secondary | ICD-10-CM | POA: Diagnosis not present

## 2024-01-26 DIAGNOSIS — N183 Chronic kidney disease, stage 3 unspecified: Secondary | ICD-10-CM | POA: Diagnosis not present

## 2024-01-26 DIAGNOSIS — L89892 Pressure ulcer of other site, stage 2: Secondary | ICD-10-CM | POA: Diagnosis not present

## 2024-01-26 DIAGNOSIS — I5022 Chronic systolic (congestive) heart failure: Secondary | ICD-10-CM | POA: Diagnosis not present

## 2024-01-26 DIAGNOSIS — E1122 Type 2 diabetes mellitus with diabetic chronic kidney disease: Secondary | ICD-10-CM | POA: Diagnosis not present

## 2024-01-26 DIAGNOSIS — E1142 Type 2 diabetes mellitus with diabetic polyneuropathy: Secondary | ICD-10-CM | POA: Diagnosis not present

## 2024-01-26 DIAGNOSIS — E1136 Type 2 diabetes mellitus with diabetic cataract: Secondary | ICD-10-CM | POA: Diagnosis not present

## 2024-01-28 ENCOUNTER — Ambulatory Visit: Admitting: Cardiology

## 2024-01-28 DIAGNOSIS — I480 Paroxysmal atrial fibrillation: Secondary | ICD-10-CM | POA: Diagnosis not present

## 2024-01-28 DIAGNOSIS — I5022 Chronic systolic (congestive) heart failure: Secondary | ICD-10-CM | POA: Diagnosis not present

## 2024-01-28 DIAGNOSIS — D519 Vitamin B12 deficiency anemia, unspecified: Secondary | ICD-10-CM | POA: Diagnosis not present

## 2024-01-28 DIAGNOSIS — E559 Vitamin D deficiency, unspecified: Secondary | ICD-10-CM | POA: Diagnosis not present

## 2024-01-28 DIAGNOSIS — E038 Other specified hypothyroidism: Secondary | ICD-10-CM | POA: Diagnosis not present

## 2024-01-28 DIAGNOSIS — E1142 Type 2 diabetes mellitus with diabetic polyneuropathy: Secondary | ICD-10-CM | POA: Diagnosis not present

## 2024-01-28 DIAGNOSIS — I509 Heart failure, unspecified: Secondary | ICD-10-CM | POA: Diagnosis not present

## 2024-01-28 DIAGNOSIS — E782 Mixed hyperlipidemia: Secondary | ICD-10-CM | POA: Diagnosis not present

## 2024-01-28 DIAGNOSIS — I110 Hypertensive heart disease with heart failure: Secondary | ICD-10-CM | POA: Diagnosis not present

## 2024-01-29 ENCOUNTER — Encounter: Payer: Self-pay | Admitting: Cardiology

## 2024-01-29 DIAGNOSIS — E1136 Type 2 diabetes mellitus with diabetic cataract: Secondary | ICD-10-CM | POA: Diagnosis not present

## 2024-01-29 DIAGNOSIS — I110 Hypertensive heart disease with heart failure: Secondary | ICD-10-CM | POA: Diagnosis not present

## 2024-01-29 DIAGNOSIS — D631 Anemia in chronic kidney disease: Secondary | ICD-10-CM | POA: Diagnosis not present

## 2024-01-29 DIAGNOSIS — L89892 Pressure ulcer of other site, stage 2: Secondary | ICD-10-CM | POA: Diagnosis not present

## 2024-01-29 DIAGNOSIS — E1142 Type 2 diabetes mellitus with diabetic polyneuropathy: Secondary | ICD-10-CM | POA: Diagnosis not present

## 2024-01-29 DIAGNOSIS — N183 Chronic kidney disease, stage 3 unspecified: Secondary | ICD-10-CM | POA: Diagnosis not present

## 2024-01-29 DIAGNOSIS — I5022 Chronic systolic (congestive) heart failure: Secondary | ICD-10-CM | POA: Diagnosis not present

## 2024-01-29 DIAGNOSIS — E1151 Type 2 diabetes mellitus with diabetic peripheral angiopathy without gangrene: Secondary | ICD-10-CM | POA: Diagnosis not present

## 2024-01-29 DIAGNOSIS — E1122 Type 2 diabetes mellitus with diabetic chronic kidney disease: Secondary | ICD-10-CM | POA: Diagnosis not present

## 2024-01-29 NOTE — Telephone Encounter (Signed)
 Error

## 2024-02-02 DIAGNOSIS — D631 Anemia in chronic kidney disease: Secondary | ICD-10-CM | POA: Diagnosis not present

## 2024-02-02 DIAGNOSIS — E1122 Type 2 diabetes mellitus with diabetic chronic kidney disease: Secondary | ICD-10-CM | POA: Diagnosis not present

## 2024-02-02 DIAGNOSIS — L89892 Pressure ulcer of other site, stage 2: Secondary | ICD-10-CM | POA: Diagnosis not present

## 2024-02-02 DIAGNOSIS — I5022 Chronic systolic (congestive) heart failure: Secondary | ICD-10-CM | POA: Diagnosis not present

## 2024-02-02 DIAGNOSIS — N183 Chronic kidney disease, stage 3 unspecified: Secondary | ICD-10-CM | POA: Diagnosis not present

## 2024-02-02 DIAGNOSIS — E1136 Type 2 diabetes mellitus with diabetic cataract: Secondary | ICD-10-CM | POA: Diagnosis not present

## 2024-02-02 DIAGNOSIS — E1142 Type 2 diabetes mellitus with diabetic polyneuropathy: Secondary | ICD-10-CM | POA: Diagnosis not present

## 2024-02-02 DIAGNOSIS — I110 Hypertensive heart disease with heart failure: Secondary | ICD-10-CM | POA: Diagnosis not present

## 2024-02-02 DIAGNOSIS — E1151 Type 2 diabetes mellitus with diabetic peripheral angiopathy without gangrene: Secondary | ICD-10-CM | POA: Diagnosis not present

## 2024-02-04 DIAGNOSIS — I5022 Chronic systolic (congestive) heart failure: Secondary | ICD-10-CM | POA: Diagnosis not present

## 2024-02-04 DIAGNOSIS — E1142 Type 2 diabetes mellitus with diabetic polyneuropathy: Secondary | ICD-10-CM | POA: Diagnosis not present

## 2024-02-04 DIAGNOSIS — E1122 Type 2 diabetes mellitus with diabetic chronic kidney disease: Secondary | ICD-10-CM | POA: Diagnosis not present

## 2024-02-04 DIAGNOSIS — N183 Chronic kidney disease, stage 3 unspecified: Secondary | ICD-10-CM | POA: Diagnosis not present

## 2024-02-04 DIAGNOSIS — D631 Anemia in chronic kidney disease: Secondary | ICD-10-CM | POA: Diagnosis not present

## 2024-02-04 DIAGNOSIS — L89892 Pressure ulcer of other site, stage 2: Secondary | ICD-10-CM | POA: Diagnosis not present

## 2024-02-04 DIAGNOSIS — E1136 Type 2 diabetes mellitus with diabetic cataract: Secondary | ICD-10-CM | POA: Diagnosis not present

## 2024-02-04 DIAGNOSIS — E1151 Type 2 diabetes mellitus with diabetic peripheral angiopathy without gangrene: Secondary | ICD-10-CM | POA: Diagnosis not present

## 2024-02-04 DIAGNOSIS — I110 Hypertensive heart disease with heart failure: Secondary | ICD-10-CM | POA: Diagnosis not present

## 2024-02-10 DIAGNOSIS — H2513 Age-related nuclear cataract, bilateral: Secondary | ICD-10-CM | POA: Diagnosis not present

## 2024-02-10 DIAGNOSIS — L89892 Pressure ulcer of other site, stage 2: Secondary | ICD-10-CM | POA: Diagnosis not present

## 2024-02-10 DIAGNOSIS — E1142 Type 2 diabetes mellitus with diabetic polyneuropathy: Secondary | ICD-10-CM | POA: Diagnosis not present

## 2024-02-10 DIAGNOSIS — E1151 Type 2 diabetes mellitus with diabetic peripheral angiopathy without gangrene: Secondary | ICD-10-CM | POA: Diagnosis not present

## 2024-02-10 DIAGNOSIS — I110 Hypertensive heart disease with heart failure: Secondary | ICD-10-CM | POA: Diagnosis not present

## 2024-02-10 DIAGNOSIS — N183 Chronic kidney disease, stage 3 unspecified: Secondary | ICD-10-CM | POA: Diagnosis not present

## 2024-02-10 DIAGNOSIS — I5022 Chronic systolic (congestive) heart failure: Secondary | ICD-10-CM | POA: Diagnosis not present

## 2024-02-10 DIAGNOSIS — E1122 Type 2 diabetes mellitus with diabetic chronic kidney disease: Secondary | ICD-10-CM | POA: Diagnosis not present

## 2024-02-10 DIAGNOSIS — D631 Anemia in chronic kidney disease: Secondary | ICD-10-CM | POA: Diagnosis not present

## 2024-02-12 DIAGNOSIS — D631 Anemia in chronic kidney disease: Secondary | ICD-10-CM | POA: Diagnosis not present

## 2024-02-12 DIAGNOSIS — I110 Hypertensive heart disease with heart failure: Secondary | ICD-10-CM | POA: Diagnosis not present

## 2024-02-12 DIAGNOSIS — E1142 Type 2 diabetes mellitus with diabetic polyneuropathy: Secondary | ICD-10-CM | POA: Diagnosis not present

## 2024-02-12 DIAGNOSIS — E1122 Type 2 diabetes mellitus with diabetic chronic kidney disease: Secondary | ICD-10-CM | POA: Diagnosis not present

## 2024-02-12 DIAGNOSIS — E1151 Type 2 diabetes mellitus with diabetic peripheral angiopathy without gangrene: Secondary | ICD-10-CM | POA: Diagnosis not present

## 2024-02-12 DIAGNOSIS — I5022 Chronic systolic (congestive) heart failure: Secondary | ICD-10-CM | POA: Diagnosis not present

## 2024-02-12 DIAGNOSIS — L89892 Pressure ulcer of other site, stage 2: Secondary | ICD-10-CM | POA: Diagnosis not present

## 2024-02-12 DIAGNOSIS — H2513 Age-related nuclear cataract, bilateral: Secondary | ICD-10-CM | POA: Diagnosis not present

## 2024-02-12 DIAGNOSIS — N183 Chronic kidney disease, stage 3 unspecified: Secondary | ICD-10-CM | POA: Diagnosis not present

## 2024-04-12 NOTE — Progress Notes (Unsigned)
 " Cardiology Office Note:    Date:  04/12/2024   ID:  Ann Powell, DOB July 03, 1940, MRN [id number]  PCP:  Duanne Butler DASEN, MD  Cardiologist:  Lonni LITTIE Nanas, MD  Electrophysiologist:  None   Referring MD: Duanne Butler DASEN, MD   No chief complaint on file. ***  History of Present Illness:    Ann Powell is a 84 y.o. female with a hx of heart failure with recovered EF, CAD, paroxysmal atrial fibrillation, PAD, hypertension, hyperlipidemia who presents for follow-up.  Found to have critical limb ischemia with occluded left SFA, popliteal and tibial arteries.  No vascular options, she was seen by Dr. Court on 08/2016 for gangrenous changes in her left toes, she was referred to Dr. Myra for revascularization but was lost to follow-up.  Presented to ED 05/2023 with respiratory distress, SpO2 down to 70s.  She was started on BiPAP.  Troponin elevation to 1135.  Echocardiogram 06/19/2023 showed EF 35 to 40%.  She was anemic during admission and as she is a [employer], conservative management of her elevated troponin was recommended.  She did have brief possible A-fib which she was started on amiodarone  but anticoagulation was deferred given her anemia.  Repeat echocardiogram 09/2023 showed EF improved to 55 to 60%.  Since last clinic visit,  Past Medical History:  Diagnosis Date   History of gastric ulcer    after hysterectomy   Hypertension    PVD (peripheral vascular disease)    Ulcer     Past Surgical History:  Procedure Laterality Date   ABDOMINAL HYSTERECTOMY     LOWER EXTREMITY INTERVENTION N/A 09/02/2016   Procedure: Lower Extremity Intervention;  Surgeon: Court Dorn PARAS, MD;  Location: [facility] INVASIVE CV LAB;  Service: Cardiovascular;  Laterality: N/A;   TOE AMPUTATION     left 3rd distal to MTP (Dr. Janit) due to chronic osteomyelitis and ischemia.    TONSILLECTOMY      Current Medications: Active Medications[1]   Allergies:   Patient has no known  allergies.   Social History   Socioeconomic History   Marital status: Single    Spouse name: Not on file   Number of children: Not on file   Years of education: Not on file   Highest education level: Not on file  Occupational History   Not on file  Tobacco Use   Smoking status: Never   Smokeless tobacco: Never  Vaping Use   Vaping status: Never Used  Substance and Sexual Activity   Alcohol use: No   Drug use: No   Sexual activity: Never  Other Topics Concern   Not on file  Social History Narrative   Not on file   Social Drivers of Health   Tobacco Use: Low Risk (10/23/2023)   Patient History    Smoking Tobacco Use: Never    Smokeless Tobacco Use: Never    Passive Exposure: Not on file  Financial Resource Strain: Low Risk (01/16/2023)   Overall Financial Resource Strain (CARDIA)    Difficulty of Paying Living Expenses: Not hard at all  Food Insecurity: No Food Insecurity (06/29/2023)   Hunger Vital Sign    Worried About Running Out of Food in the Last Year: Never true    Ran Out of Food in the Last Year: Never true  Transportation Needs: No Transportation Needs (06/29/2023)   PRAPARE - Administrator, Civil Service (Medical): No    Lack of Transportation (Non-Medical): No  Physical Activity:  Insufficiently Active (01/16/2023)   Exercise Vital Sign    Days of Exercise per Week: 3 days    Minutes of Exercise per Session: 30 min  Stress: No Stress Concern Present (01/16/2023)   [employer] of Occupational Health - Occupational Stress Questionnaire    Feeling of Stress : Not at all  Social Connections: Moderately Isolated (06/29/2023)   Social Connection and Isolation Panel    Frequency of Communication with Friends and Family: More than three times a week    Frequency of Social Gatherings with Friends and Family: More than three times a week    Attends Religious Services: More than 4 times per year    Active Member of Clubs or Organizations: No     Attends Banker Meetings: Never    Marital Status: Never married  Depression (PHQ2-9): Low Risk (03/04/2023)   Depression (PHQ2-9)    PHQ-2 Score: 1  Alcohol Screen: Low Risk (01/16/2023)   Alcohol Screen    Last Alcohol Screening Score (AUDIT): 0  Housing: Low Risk (06/29/2023)   Housing Stability Vital Sign    Unable to Pay for Housing in the Last Year: No    Number of Times Moved in the Last Year: 0    Homeless in the Last Year: No  Utilities: Not At Risk (06/29/2023)   AHC Utilities    Threatened with loss of utilities: No  Health Literacy: Adequate Health Literacy (01/16/2023)   B1300 Health Literacy    Frequency of need for help with medical instructions: Never     Family History: The patient's ***family history is not on file.  ROS:   Please see the history of present illness.    *** All other systems reviewed and are negative.  EKGs/Labs/Other Studies Reviewed:    The following studies were reviewed today: ***  EKG:  EKG is *** ordered today.  The ekg ordered today demonstrates ***  Recent Labs: 07/01/2023: B Natriuretic Peptide 1,780.1 07/04/2023: Magnesium  2.1 10/23/2023: ALT 14; BUN 23; Creatinine, Ser 1.11; Hemoglobin 12.7; Platelets 502; Potassium 4.8; Sodium 140; TSH 6.830  Recent Lipid Panel    Component Value Date/Time   CHOL 146 10/23/2023 0939   TRIG 138 10/23/2023 0939   HDL 61 10/23/2023 0939   CHOLHDL 2.4 10/23/2023 0939   CHOLHDL 1.7 03/04/2023 1050   VLDL 16 10/19/2014 0809   LDLCALC 61 10/23/2023 0939   LDLCALC 32 03/04/2023 1050    Physical Exam:    VS:  There were no vitals taken for this visit.    Wt Readings from Last 3 Encounters:  10/23/23 116 lb 9.6 oz (52.9 kg)  07/07/23 105 lb 6.1 oz (47.8 kg)  03/04/23 118 lb 6.4 oz (53.7 kg)     GEN: *** Well nourished, well developed in no acute distress HEENT: Normal NECK: No JVD; No carotid bruits LYMPHATICS: No lymphadenopathy CARDIAC: ***RRR, no murmurs, rubs,  gallops RESPIRATORY:  Clear to auscultation without rales, wheezing or rhonchi  ABDOMEN: Soft, non-tender, non-distended MUSCULOSKELETAL:  No edema; No deformity  SKIN: Warm and dry NEUROLOGIC:  Alert and oriented x 3 PSYCHIATRIC:  Normal affect   ASSESSMENT:    No diagnosis found. PLAN:    Heart failure with recovered EF: Echo 05/2023 with EF 35 to 40%, during admission with hypoxic respiratory failure.  She was started on metoprolol  and spironolactone .  Did not tolerate Jardiance  due to hypoglycemia and BP too low for ARB.  Repeat echo 09/2023 showed EF improved to 55 to 60%. -  Continue Toprol -XL 12.5 mg daily - Continue spironolactone  12.5 mg daily - Continue Lasix  40 mg daily.  Check BMET, magnesium   CAD: Troponin elevation up to 1100 during admission 05/2023, suspected demand ischemia in setting of her acute hypoxic respiratory failure.  Ischemic evaluation was deferred given her chronic anemia and she is Jehov[employer]'s Witness - Stress PET*** - Continue aspirin  and atorvastatin .  Recommend discontinuing Plavix ***  PAD: History of critical limb ischemia and gangrene, found have occluded left SFA popliteal and tibial arteries.  Currently denies any lower extremity pain or wounds. - Check ABIs***  Atrial fibrillation: Noted to have episode of possible A-fib with RVR during admission with acute illness 05/2023.  She was not started on anticoagulation given her anemia and does not accept blood products as she is Jehovah's Witness.  Cardiac monitor x 12 days 09/2023 showed no significant arrhythmias, her amiodarone  was discontinued.  Hyperlipidemia: On atorvastatin  80 mg daily  Anemia: Hemoglobin down to 7.6 during admission 05/2023.  Most recent hemoglobin improved to 12.7 on 10/23/2023***  RTC in***  Medication Adjustments/Labs and Tests Ordered: Current medicines are reviewed at length with the patient today.  Concerns regarding medicines are outlined above.  No orders of the defined types  were placed in this encounter.  No orders of the defined types were placed in this encounter.   There are no Patient Instructions on file for this visit.   Signed, Lonni LITTIE Nanas, MD  04/12/2024 5:42 PM    [facility]    [1]  No outpatient medications have been marked as taking for the 04/14/24 encounter (Appointment) with Nanas Lonni LITTIE, MD.   "

## 2024-04-14 ENCOUNTER — Ambulatory Visit: Admitting: Cardiology
